# Patient Record
Sex: Female | Born: 1955 | Race: Black or African American | Hispanic: No | Marital: Single | Smoking: Former smoker
Health system: Southern US, Community
[De-identification: ages and names within clinical notes are randomized; demographics above are authoritative.]

## PROBLEM LIST (undated history)

## (undated) DIAGNOSIS — F419 Anxiety disorder, unspecified: Secondary | ICD-10-CM

## (undated) DIAGNOSIS — N183 Chronic kidney disease, stage 3 unspecified: Secondary | ICD-10-CM

## (undated) DIAGNOSIS — R001 Bradycardia, unspecified: Secondary | ICD-10-CM

## (undated) DIAGNOSIS — U071 COVID-19: Secondary | ICD-10-CM

## (undated) DIAGNOSIS — E119 Type 2 diabetes mellitus without complications: Secondary | ICD-10-CM

## (undated) DIAGNOSIS — K5900 Constipation, unspecified: Secondary | ICD-10-CM

## (undated) DIAGNOSIS — G4730 Sleep apnea, unspecified: Secondary | ICD-10-CM

## (undated) DIAGNOSIS — M545 Low back pain, unspecified: Secondary | ICD-10-CM

## (undated) DIAGNOSIS — G8929 Other chronic pain: Secondary | ICD-10-CM

## (undated) DIAGNOSIS — I2510 Atherosclerotic heart disease of native coronary artery without angina pectoris: Secondary | ICD-10-CM

## (undated) DIAGNOSIS — E785 Hyperlipidemia, unspecified: Secondary | ICD-10-CM

## (undated) DIAGNOSIS — R911 Solitary pulmonary nodule: Secondary | ICD-10-CM

## (undated) DIAGNOSIS — I4719 Other supraventricular tachycardia: Secondary | ICD-10-CM

## (undated) DIAGNOSIS — J42 Unspecified chronic bronchitis: Secondary | ICD-10-CM

## (undated) DIAGNOSIS — I471 Supraventricular tachycardia: Secondary | ICD-10-CM

## (undated) DIAGNOSIS — D649 Anemia, unspecified: Secondary | ICD-10-CM

## (undated) DIAGNOSIS — J449 Chronic obstructive pulmonary disease, unspecified: Secondary | ICD-10-CM

## (undated) DIAGNOSIS — R058 Other specified cough: Secondary | ICD-10-CM

## (undated) DIAGNOSIS — I10 Essential (primary) hypertension: Secondary | ICD-10-CM

## (undated) DIAGNOSIS — K219 Gastro-esophageal reflux disease without esophagitis: Secondary | ICD-10-CM

## (undated) DIAGNOSIS — I442 Atrioventricular block, complete: Secondary | ICD-10-CM

## (undated) DIAGNOSIS — G4733 Obstructive sleep apnea (adult) (pediatric): Secondary | ICD-10-CM

## (undated) HISTORY — DX: Chronic obstructive pulmonary disease, unspecified: J44.9

## (undated) HISTORY — DX: Anxiety disorder, unspecified: F41.9

## (undated) HISTORY — DX: Chronic kidney disease, stage 3 unspecified: N18.30

## (undated) HISTORY — DX: Anemia, unspecified: D64.9

## (undated) HISTORY — DX: Hyperlipidemia, unspecified: E78.5

## (undated) HISTORY — PX: REVISION TOTAL HIP ARTHROPLASTY: SHX766

## (undated) HISTORY — DX: Chronic kidney disease, stage 3 (moderate): N18.3

## (undated) HISTORY — PX: JOINT REPLACEMENT: SHX530

## (undated) HISTORY — DX: Bradycardia, unspecified: R00.1

## (undated) HISTORY — DX: Morbid (severe) obesity due to excess calories: E66.01

## (undated) HISTORY — DX: Sleep apnea, unspecified: G47.30

## (undated) HISTORY — DX: Other supraventricular tachycardia: I47.19

## (undated) HISTORY — DX: Supraventricular tachycardia: I47.1

## (undated) HISTORY — DX: Atrioventricular block, complete: I44.2

---

## 1998-06-30 ENCOUNTER — Encounter: Payer: Self-pay | Admitting: *Deleted

## 1998-06-30 ENCOUNTER — Emergency Department (HOSPITAL_COMMUNITY): Admission: EM | Admit: 1998-06-30 | Discharge: 1998-06-30 | Payer: Self-pay | Admitting: Emergency Medicine

## 2004-11-03 ENCOUNTER — Emergency Department (HOSPITAL_COMMUNITY): Admission: EM | Admit: 2004-11-03 | Discharge: 2004-11-03 | Payer: Self-pay | Admitting: Emergency Medicine

## 2008-02-05 ENCOUNTER — Emergency Department (HOSPITAL_COMMUNITY): Admission: EM | Admit: 2008-02-05 | Discharge: 2008-02-05 | Payer: Self-pay | Admitting: Family Medicine

## 2009-04-14 ENCOUNTER — Emergency Department (HOSPITAL_COMMUNITY): Admission: EM | Admit: 2009-04-14 | Discharge: 2009-04-14 | Payer: Self-pay | Admitting: Family Medicine

## 2010-01-03 DIAGNOSIS — F17200 Nicotine dependence, unspecified, uncomplicated: Secondary | ICD-10-CM

## 2010-01-03 DIAGNOSIS — E785 Hyperlipidemia, unspecified: Secondary | ICD-10-CM | POA: Insufficient documentation

## 2010-01-03 DIAGNOSIS — I10 Essential (primary) hypertension: Secondary | ICD-10-CM | POA: Insufficient documentation

## 2010-01-03 DIAGNOSIS — N179 Acute kidney failure, unspecified: Secondary | ICD-10-CM

## 2010-01-03 DIAGNOSIS — J449 Chronic obstructive pulmonary disease, unspecified: Secondary | ICD-10-CM

## 2010-01-04 ENCOUNTER — Ambulatory Visit: Payer: Self-pay | Admitting: Cardiovascular Disease

## 2010-01-04 ENCOUNTER — Inpatient Hospital Stay (HOSPITAL_COMMUNITY): Admission: EM | Admit: 2010-01-04 | Discharge: 2010-01-08 | Payer: Self-pay | Admitting: Emergency Medicine

## 2010-01-04 LAB — CONVERTED CEMR LAB
ALT: 14 units/L
Albumin: 3.6 g/dL
Alkaline Phosphatase: 53 units/L
Total Bilirubin: 0.3 mg/dL
Total Protein: 7.4 g/dL

## 2010-01-05 LAB — CONVERTED CEMR LAB
Total CHOL/HDL Ratio: 3.2
Triglycerides: 62 mg/dL
VLDL: 12 mg/dL

## 2010-01-07 ENCOUNTER — Encounter (INDEPENDENT_AMBULATORY_CARE_PROVIDER_SITE_OTHER): Payer: Self-pay | Admitting: Family Medicine

## 2010-01-07 LAB — CONVERTED CEMR LAB
BUN: 25 mg/dL
CO2: 24 meq/L
Calcium: 9 mg/dL
Chloride: 104 meq/L
Creatinine, Ser: 1.46 mg/dL
Glucose, Bld: 193 mg/dL
HCT: 38.8 %
Hemoglobin: 12.6 g/dL
MCHC: 32.5 g/dL
MCV: 85.1 fL
Platelets: 181 10*3/uL
Potassium: 4.5 meq/L
RBC: 4.56 M/uL
RDW: 14.4 %
Sodium: 137 meq/L
WBC: 11.8 10*3/uL

## 2010-01-28 ENCOUNTER — Ambulatory Visit: Payer: Self-pay | Admitting: Nurse Practitioner

## 2010-01-28 DIAGNOSIS — M549 Dorsalgia, unspecified: Secondary | ICD-10-CM | POA: Insufficient documentation

## 2010-01-28 DIAGNOSIS — E0590 Thyrotoxicosis, unspecified without thyrotoxic crisis or storm: Secondary | ICD-10-CM | POA: Insufficient documentation

## 2010-01-28 LAB — CONVERTED CEMR LAB
Cholesterol, target level: 200 mg/dL
HDL goal, serum: 40 mg/dL
LDL Goal: 130 mg/dL

## 2010-03-03 ENCOUNTER — Ambulatory Visit: Payer: Self-pay | Admitting: Nurse Practitioner

## 2010-03-09 ENCOUNTER — Encounter (INDEPENDENT_AMBULATORY_CARE_PROVIDER_SITE_OTHER): Payer: Self-pay | Admitting: Nurse Practitioner

## 2010-04-14 ENCOUNTER — Ambulatory Visit: Payer: Self-pay | Admitting: Nurse Practitioner

## 2010-04-30 ENCOUNTER — Telehealth (INDEPENDENT_AMBULATORY_CARE_PROVIDER_SITE_OTHER): Payer: Self-pay | Admitting: Nurse Practitioner

## 2010-05-18 ENCOUNTER — Telehealth (INDEPENDENT_AMBULATORY_CARE_PROVIDER_SITE_OTHER): Payer: Self-pay | Admitting: Nurse Practitioner

## 2010-05-18 ENCOUNTER — Ambulatory Visit: Admit: 2010-05-18 | Payer: Self-pay | Admitting: Nurse Practitioner

## 2010-06-04 NOTE — Letter (Signed)
Summary: PT INFORMATION SHEET  PT INFORMATION SHEET   Imported By: Arta Bruce 01/29/2010 14:13:49  _____________________________________________________________________  External Attachment:    Type:   Image     Comment:   External Document

## 2010-06-04 NOTE — Letter (Signed)
Summary: *HSN Results Follow up  [facility]  [address] [place], [place] [address]   Phone: [phone]  Fax: [phone]      03/09/2010   [name] [place], [place]  [address]   Dear  Ms. [name],                            ____S.Drinkard,FNP   ____D. Gore,FNP       ____B. McPherson,MD   ____V. Rankins,MD    ____E. Mulberry,MD    __X__N. Daphine Deutscher, FNP  ____D. Reche Dixon, MD    ____K. Philipp Deputy, MD    ____Other     This letter is to inform you that your recent test(s):  _______Pap Smear    ___X____Lab Test     _______X-ray    ___X____ is within acceptable limits  _______ requires a medication change  _______ requires a follow-up lab visit  _______ requires a follow-up visit with your provider   Comments: Labs done during recent office visit are normal.       _________________________________________________________ If you have any questions, please contact our office [phone].                    Sincerely,    Lehman Prom FNP [facility]

## 2010-06-04 NOTE — Assessment & Plan Note (Signed)
Summary: NEW - Hospital F/u   Vital Signs:  Patient profile:   55 year old female LMP:     01/2010 Height:      63.50 inches Weight:      217.4 pounds BMI:     38.04 O2 Sat:      97 % on Room air Temp:     97.4 degrees F oral Pulse rate:   94 / minute Pulse rhythm:   regular Resp:     24 per minute BP sitting:   110 / 66  (left arm) Cuff size:   regular  Vitals Entered By: Levon Hedger (January 28, 2010 10:53 AM)  Nutrition Counseling: Patient's BMI is greater than 25 and therefore counseled on weight management options.  O2 Flow:  Room air  Serial Vital Signs/Assessments:  Comments: 11:51 AM peak flow - 300, 150, : done by Levon Hedger By: Lehman Prom FNP   CC: Hypertension Management, Lipid Management, Back Pain Is Patient Diabetic? No Pain Assessment Patient in pain? yes     Location: back, shoulder Intensity: 10 Onset of pain  Constant  Does patient need assistance? Functional Status Self care Ambulation Normal LMP (date): 01/2010     Enter LMP: 01/2010   CC:  Hypertension Management, Lipid Management, and Back Pain.  History of Present Illness:  Pt into the office to estabilsh care. No previous PCP in over 20 years.  No PMH prior to recent to hospitalizations PSH - c sections x 3  Hospitalized from 01/03/2010 to 01/08/2010 (full hospital d/c reviewed) She presented to the ER with SOB which had been progressing over time.  1.  Hypoxic respiratory failure - pt was started on empiric therpay consisting of avelox, solumedrol and nebulizer treatments.  She was transitioned to by mouth prednisone.  Pt has d/c on doxycycline of which she has completed.    2.  Acute on chronic bronchitis/chronic obstructive pulmonary disease exacerbation  3.  Hypertension - pt started on a combination of clonidine and diltiazem  4.  Acute renal failure - no ACE or ARB due to Renal insufficiency  5.  Tobacco Abuse - pt was started on patches but she was not  able to maintain  6.  Mild dyslipidemia  7.  Low TSH, free T4 within normal limits  8.  Steroid induced hyperglycemia  Back Pain History:      The pain is located in the lower back region and does not radiate below the knees.  She states that she has had a prior history of back pain.  The patient has not had any recent physical therapy for her back pain.    Critical Exclusionary Diagnosis Criteria (CEDC) for Back Pain:      The patient gives a history of previous trauma.  She notes a prior history of spinal surgery.  There are no symptoms to suggest infection, cancer, cauda equina, or psychosocial factors for back pain.  Other positive CEDC factors include low back pain worse with activity.    Hypertension History:      She denies headache, chest pain, and palpitations.  She notes no problems with any antihypertensive medication side effects.  blood pressure is doing well.        Positive major cardiovascular risk factors include hyperlipidemia, hypertension, and current tobacco user.  Negative major cardiovascular risk factors include female age less than 2 years old and no history of diabetes.        Further assessment for target organ damage reveals  no history of ASHD, cardiac end-organ damage (CHF/LVH), stroke/TIA, peripheral vascular disease, renal insufficiency, or hypertensive retinopathy.    Lipid Management History:      Positive NCEP/ATP III risk factors include current tobacco user and hypertension.  Negative NCEP/ATP III risk factors include female age less than 71 years old, non-diabetic, no ASHD (atherosclerotic heart disease), no prior stroke/TIA, no peripheral vascular disease, and no history of aortic aneurysm.        The patient states that she knows about the "Therapeutic Lifestyle Change" diet.  The patient expresses understanding of adjunctive measures for cholesterol lowering.  She expresses no side effects from her lipid-lowering medication.  The patient denies any  symptoms to suggest myopathy or liver disease.      Habits & Providers  Alcohol-Tobacco-Diet     Alcohol drinks/day: 0     Tobacco Status: current     Tobacco Counseling: to quit use of tobacco products     Cigarette Packs/Day: 3-4 a day     Year Started: age 5  Exercise-Depression-Behavior     Drug Use: never  Allergies (verified): No Known Drug Allergies  Past History:  Past Surgical History: 3 c-section  Family History: mother - diabeter, htn father - MI and ESRD  Social History: 7 children single Tobacco - 5 cigs per day ETOH - none Drug - noneSmoking Status:  current Packs/Day:  3-4 a day Drug Use:  never  Review of Systems CV:  Denies chest pain or discomfort. Resp:  Complains of cough and shortness of breath. GI:  Denies constipation. MS:  Complains of low back pain.  Physical Exam  General:  alert.   Head:  normocephalic.   Lungs:  decreased bases Heart:  normal rate and regular rhythm.   Abdomen:  normal bowel sounds.   Neurologic:  alert & oriented X3.     Detailed Back/Spine Exam  General:    obese.    Lumbosacral Exam:  Inspection-deformity:    Normal Palpation-spinal tenderness:  Normal   Impression & Recommendations:  Problem # 1:  COPD (ICD-496) pt seems informed about the dx symbicort inhaler given to pt Her updated medication list for this problem includes:    Ventolin Hfa 108 (90 Base) Mcg/act Aers (Albuterol sulfate) .Marland Kitchen..Marland Kitchen Two puffs every 6 hours as needed for shortness of breath    Albuterol Sulfate (2.5 Mg/32ml) 0.083% Nebu (Albuterol sulfate) .Marland Kitchen... 1 nebulization inhaled four times a daily    Ventolin Hfa 108 (90 Base) Mcg/act Aers (Albuterol sulfate) .Marland Kitchen... 2 puffs inhaled four times daily as needed    Symbicort 160-4.5 Mcg/act Aero (Budesonide-formoterol fumarate) ..... One inhalation twice daily for breathing  Orders: Peak Flow Rate (94150) Pulse Oximetry (single measurment) (94760)  Problem # 2:  HYPERTHYROIDISM  (ICD-242.90) will need to recheck on next  Problem # 3:  HYPERTENSION, BENIGN ESSENTIAL (ICD-401.1) BP is stable continue current meds Her updated medication list for this problem includes:    Clonidine Hcl 0.2 Mg Tabs (Clonidine hcl) ..... One tablet by mouth three times a day for blood pressure    Diltiazem Hcl 120 Mg Tabs (Diltiazem hcl) ..... One tablet by mouth two times a day for blood pressure    Hydrochlorothiazide 25 Mg Tabs (Hydrochlorothiazide) ..... One tablet by mouth daily for blood pressure  Problem # 4:  TOBACCO ABUSE (ICD-305.1) advised cessation  Problem # 5:  OBESITY (ICD-278.00) need to increase exercise and monitor diet  Problem # 6:  BACK PAIN (ICD-724.5) may be increase due  to recent breathing problems Her updated medication list for this problem includes:    Naproxen 500 Mg Tabs (Naproxen) ..... One tablet by mouth two times a day as needed for pain  Complete Medication List: 1)  Ventolin Hfa 108 (90 Base) Mcg/act Aers (Albuterol sulfate) .... Two puffs every 6 hours as needed for shortness of breath 2)  Clonidine Hcl 0.2 Mg Tabs (Clonidine hcl) .... One tablet by mouth three times a day for blood pressure 3)  Diltiazem Hcl 120 Mg Tabs (Diltiazem hcl) .... One tablet by mouth two times a day for blood pressure 4)  Albuterol Sulfate (2.5 Mg/100ml) 0.083% Nebu (Albuterol sulfate) .Marland Kitchen.. 1 nebulization inhaled four times a daily 5)  Hydrochlorothiazide 25 Mg Tabs (Hydrochlorothiazide) .... One tablet by mouth daily for blood pressure 6)  Ventolin Hfa 108 (90 Base) Mcg/act Aers (Albuterol sulfate) .... 2 puffs inhaled four times daily as needed 7)  Symbicort 160-4.5 Mcg/act Aero (Budesonide-formoterol fumarate) .... One inhalation twice daily for breathing 8)  Naproxen 500 Mg Tabs (Naproxen) .... One tablet by mouth two times a day as needed for pain  Hypertension Assessment/Plan:      The patient's hypertensive risk group is category B: At least one risk factor  (excluding diabetes) with no target organ damage.  Her calculated 10 year risk of coronary heart disease is 6 %.  Today's blood pressure is 110/66.  Her blood pressure goal is < 140/90.  Lipid Assessment/Plan:      Based on NCEP/ATP III, the patient's risk factor category is "2 or more risk factors and a calculated 10 year CAD risk of < 20%".  The patient's lipid goals are as follows: Total cholesterol goal is 200; LDL cholesterol goal is 130; HDL cholesterol goal is 40; Triglyceride goal is 150.  Her LDL cholesterol goal has been met.     Patient Instructions: 1)  Schedule an appointment for an eligibility appointment. 2)  Use the symbicort inhaler - rinse your mouth after use. 3)  you have been given a sample of this medication so use it until it complete 4)  Schedule a follow up with n.martin,fnp for copd after your eligibilty appointment. 5)  You will need o2 sat, peak flow, flu vaccine, tsh Prescriptions: NAPROXEN 500 MG TABS (NAPROXEN) ONe tablet by mouth two times a day as needed for pain  #50 x 0   Entered and Authorized by:   Lehman Prom FNP   Signed by:   Lehman Prom FNP on 01/28/2010   Method used:   Print then Give to Patient   RxID:   [id number] SYMBICORT 160-4.5 MCG/ACT AERO (BUDESONIDE-FORMOTEROL FUMARATE) One inhalation twice daily for breathing  #1 x 0   Entered and Authorized by:   Lehman Prom FNP   Signed by:   Lehman Prom FNP on 01/28/2010   Method used:   Samples Given   RxID:   [id number] VENTOLIN HFA 108 (90 BASE) MCG/ACT AERS (ALBUTEROL SULFATE) 2 puffs inhaled four times daily as needed  #1 x 1   Entered and Authorized by:   Lehman Prom FNP   Signed by:   Lehman Prom FNP on 01/28/2010   Method used:   Print then Give to Patient   RxID:   [id number] VENTOLIN HFA 108 (90 BASE) MCG/ACT AERS (ALBUTEROL SULFATE) Two puffs every 6 hours as needed for shortness of breath  #1 x 1   Entered and Authorized by:   Lehman Prom  FNP   Signed by:  Lehman Prom FNP on 01/28/2010   Method used:   Print then Give to Patient   RxID:   [id number] HYDROCHLOROTHIAZIDE 25 MG TABS (HYDROCHLOROTHIAZIDE) One tablet by mouth daily for blood pressure  #30 x 1   Entered and Authorized by:   Lehman Prom FNP   Signed by:   Lehman Prom FNP on 01/28/2010   Method used:   Print then Give to Patient   RxID:   [id number] DILTIAZEM HCL 120 MG TABS (DILTIAZEM HCL) One tablet by mouth two times a day for blood pressure  #60 x 1   Entered and Authorized by:   Lehman Prom FNP   Signed by:   Lehman Prom FNP on 01/28/2010   Method used:   Print then Give to Patient   RxID:   [id number] CLONIDINE HCL 0.2 MG TABS (CLONIDINE HCL) One tablet by mouth three times a day for blood pressure  #90 x 1   Entered and Authorized by:   Lehman Prom FNP   Signed by:   Lehman Prom FNP on 01/28/2010   Method used:   Print then Give to Patient   RxID:   [id number]    CXR  Procedure date:  01/06/2010  Findings:      showed interval improved bibasilar ventilation.  otherwise stable chronic lung changes  Echocardiogram  Procedure date:  01/07/2010  Findings:      mild LVH with an EF at 55-60%. no regional wall motion abnormalities   CXR  Procedure date:  01/06/2010  Findings:      showed interval improved bibasilar ventilation.  otherwise stable chronic lung changes  Echocardiogram  Procedure date:  01/07/2010  Findings:      mild LVH with an EF at 55-60%. no regional wall motion abnormalities

## 2010-06-04 NOTE — Progress Notes (Signed)
Summary: Query:  Refill clonidine per protocol?  Phone Note Refill Request Call back at [phone]   Refills Requested: Medication #1:  CLONIDINE HCL 0.2 MG TABS One tablet by mouth three times a day for blood pressure [facility]   Initial call taken by: Domenic Polite,  April 30, 2010 10:32 AM  Follow-up for Phone Call        Last seen 03/2010 - Rx date 01/2010 with no refills.  Refill per protocol?  Spoke with pt. -- states she takes every day, three pills per day. Follow-up by: Dutch Quint RN,  April 30, 2010 10:47 AM  Additional Follow-up for Phone Call Additional follow up Details #1::        yes , ok to fill per protocal.  Additional Follow-up by: Lehman Prom FNP,  April 30, 2010 11:04 AM    Additional Follow-up for Phone Call Additional follow up Details #2::    Noted.  Refill completed - pt. notified. Dutch Quint RN  April 30, 2010 2:42 PM   Prescriptions: CLONIDINE HCL 0.2 MG TABS (CLONIDINE HCL) One tablet by mouth three times a day for blood pressure  #90 x 3   Entered by:   Dutch Quint RN   Authorized by:   Lehman Prom FNP   Signed by:   Dutch Quint RN on 04/30/2010   Method used:   Faxed to ...       [facility] (retail)       [address] [place], [place]  [address]       Ph: [phone]       Fax: [phone]   RxID:   [id number]

## 2010-06-04 NOTE — Assessment & Plan Note (Signed)
Summary: COPD   Vital Signs:  Patient profile:   55 year old female LMP:     01/2010 Weight:      229.8 pounds BMI:     40.21 O2 Sat:      99 % on Room air Temp:     98.2 degrees F oral Pulse rate:   96 / minute Pulse rhythm:   regular Resp:     24 per minute BP sitting:   110 / 70  (left arm) Cuff size:   regular  Vitals Entered By: Levon Hedger (March 03, 2010 9:03 AM)  Nutrition Counseling: Patient's BMI is greater than 25 and therefore counseled on weight management options.  O2 Flow:  Room air  Serial Vital Signs/Assessments:  Comments: 9:12 AM P/F  150,  150,  150 By: Levon Hedger   CC: follow-up visit breathing issue, Hypertension Management, Lipid Management Is Patient Diabetic? No Pain Assessment Patient in pain? no       Does patient need assistance? Functional Status Self care Ambulation Normal LMP (date): 01/2010     Enter LMP: 01/2010   CC:  follow-up visit breathing issue, Hypertension Management, and Lipid Management.  History of Present Illness:  Pt into the office for f/u on COPD Pt was seen s/p hospitalization on the last visit. She was given a symbicort inhaler during her last visit.  She reports that she did GREAT. She finished taking the medication about 4 days ago and noted that breathing started back being labored. When she was using the symbicort she did not use the MDI at all.  Pt did NOT bring her medications into the office.  Pt advised to bring all medications to each office visit.  Hypertension History:      She denies headache, chest pain, and palpitations.  She notes no problems with any antihypertensive medication side effects.        Positive major cardiovascular risk factors include hyperlipidemia, hypertension, and current tobacco user.  Negative major cardiovascular risk factors include female age less than 39 years old and no history of diabetes.        Further assessment for target organ damage reveals no  history of ASHD, cardiac end-organ damage (CHF/LVH), stroke/TIA, peripheral vascular disease, renal insufficiency, or hypertensive retinopathy.    Lipid Management History:      Positive NCEP/ATP III risk factors include current tobacco user and hypertension.  Negative NCEP/ATP III risk factors include female age less than 31 years old, non-diabetic, no ASHD (atherosclerotic heart disease), no prior stroke/TIA, no peripheral vascular disease, and no history of aortic aneurysm.        The patient states that she knows about the "Therapeutic Lifestyle Change" diet.      Habits & Providers  Alcohol-Tobacco-Diet     Alcohol drinks/day: 0     Tobacco Status: current     Tobacco Counseling: to quit use of tobacco products     Cigarette Packs/Day: 0.5  Exercise-Depression-Behavior     Drug Use: never  Allergies (verified): No Known Drug Allergies  Social History: Packs/Day:  0.5  Review of Systems General:  Denies fever. CV:  Denies chest pain or discomfort. Resp:  Complains of shortness of breath; restarted with the end of symbicort. GI:  Denies abdominal pain, nausea, and vomiting.  Physical Exam  General:  alert.  obese Head:  normocephalic.   Lungs:  scattered wheezes decrease air movement Heart:  normal rate and regular rhythm.   Abdomen:  normal bowel  sounds.   Msk:  up to the exam table Neurologic:  alert & oriented X3.   Skin:  color normal.   Psych:  Oriented X3.     Impression & Recommendations:  Problem # 1:  COPD (ICD-496) Will give another sample of symbicort pt to get pt assistance from the pharmacy neb given in office  Her updated medication list for this problem includes:    Ventolin Hfa 108 (90 Base) Mcg/act Aers (Albuterol sulfate) .Marland Kitchen..Marland Kitchen Two puffs every 6 hours as needed for shortness of breath    Albuterol Sulfate (2.5 Mg/80ml) 0.083% Nebu (Albuterol sulfate) .Marland Kitchen... 1 nebulization inhaled four times a daily    Ventolin Hfa 108 (90 Base) Mcg/act Aers  (Albuterol sulfate) .Marland Kitchen... 2 puffs inhaled four times daily as needed    Symbicort 160-4.5 Mcg/act Aero (Budesonide-formoterol fumarate) ..... One inhalation twice daily for breathing  Orders: Peak Flow Rate (94150) Pulse Oximetry (single measurment) (94760) Nebulizer Tx ([address]) Atrovent 1mg  (Neb) [phone]) Albuterol Sulfate Sol 1mg  unit dose (U9811)  Problem # 2:  OBESITY (ICD-278.00) Pt advised that she needs to lose weight She will try to modify her diet and increase her exercise  Problem # 3:  TOBACCO ABUSE (ICD-305.1) advised cessation as this making COPD progress  Problem # 4:  HYPERTHYROIDISM (ICD-242.90) will check labs today Orders: T-TSH ([id number])  Problem # 5:  HYPERLIPIDEMIA, MILD (ICD-272.4) no current meds  Problem # 6:  NEED PROPHYLACTIC VACCINATION&INOCULATION FLU (ICD-V04.81) given today in office  Complete Medication List: 1)  Ventolin Hfa 108 (90 Base) Mcg/act Aers (Albuterol sulfate) .... Two puffs every 6 hours as needed for shortness of breath 2)  Clonidine Hcl 0.2 Mg Tabs (Clonidine hcl) .... One tablet by mouth three times a day for blood pressure 3)  Diltiazem Hcl 120 Mg Tabs (Diltiazem hcl) .... One tablet by mouth two times a day for blood pressure 4)  Albuterol Sulfate (2.5 Mg/77ml) 0.083% Nebu (Albuterol sulfate) .Marland Kitchen.. 1 nebulization inhaled four times a daily 5)  Hydrochlorothiazide 25 Mg Tabs (Hydrochlorothiazide) .... One tablet by mouth daily for blood pressure 6)  Ventolin Hfa 108 (90 Base) Mcg/act Aers (Albuterol sulfate) .... 2 puffs inhaled four times daily as needed 7)  Symbicort 160-4.5 Mcg/act Aero (Budesonide-formoterol fumarate) .... One inhalation twice daily for breathing 8)  Naproxen 500 Mg Tabs (Naproxen) .... One tablet by mouth two times a day as needed for pain  Other Orders: Flu Vaccine 4yrs + (91478) Admin 1st Vaccine (29562)  Hypertension Assessment/Plan:      The patient's hypertensive risk group is category B: At least  one risk factor (excluding diabetes) with no target organ damage.  Her calculated 10 year risk of coronary heart disease is 6 %.  Today's blood pressure is 110/70.  Her blood pressure goal is < 140/90.  Lipid Assessment/Plan:      Based on NCEP/ATP III, the patient's risk factor category is "0-1 risk factors".  The patient's lipid goals are as follows: Total cholesterol goal is 200; LDL cholesterol goal is 130; HDL cholesterol goal is 40; Triglyceride goal is 150.  Her LDL cholesterol goal has been met.    Patient Instructions: 1)  Keep up the effort to quit smoking. 2)  Smoking makes your COPD worse. 3)  Symbicort - 1 inhalation two times a day - sample given 4)  Will [facility] a [facility] to [facility] and you will need to check there to complete forms. 5)  You thyroid labs will be checked today.  6)  Blood pressure - your blood pressure is doing great today.  Continue current medications. 7)  Weight - You need to work to decrease your weight. 8)  Start by drinking more DIET sodas and water. 9)  Try to increase your physical activity.  You can start by walking 10-15 minutes daily. 10)  Schedule an appointment for a complete physical exam. 11)  You will need PAP, mammogram, EKG, peak flow, o2 Sat. Prescriptions: SYMBICORT 160-4.5 MCG/ACT AERO (BUDESONIDE-FORMOTEROL FUMARATE) One inhalation twice daily for breathing  #1 x 5   Entered and Authorized by:   Lehman Prom FNP   Signed by:   Lehman Prom FNP on 03/03/2010   Method used:   Faxed to ...       [facility] (retail)       [address] [place], [place]  [address]       Ph: [phone]       Fax: [phone]   RxID:   [id number]    Medication Administration  Medication # 1:    Medication: Atrovent 1mg  (Neb)    Diagnosis: COPD (ICD-496)    Dose: 1 mg    Route: inhaled    Exp Date: 02/2011    Mfr: nephron    Patient tolerated medication without  complications    Given by: Gaylyn Cheers RN (March 03, 2010 10:30 AM)  Medication # 2:    Medication: Albuterol Sulfate Sol 1mg  unit dose    Diagnosis: COPD (ICD-496)    Dose: 1mg     Route: inhaled    Exp Date: 08/2010    Mfr: nephron    Patient tolerated medication without complications    Given by: Gaylyn Cheers RN (March 03, 2010 10:31 AM)  Orders Added: 1)  Est. Patient Level III [84132] 2)  Peak Flow Rate [94150] 3)  Pulse Oximetry (single measurment) [94760] 4)  T-TSH [44010-27253] 5)  Nebulizer Tx [66440] 6)  Atrovent 1mg  (Neb) [H4742] 7)  Albuterol Sulfate Sol 1mg  unit dose [J7613] 8)  Flu Vaccine 75yrs + [90658] 9)  Admin 1st Vaccine [90471]   Immunizations Administered:  Influenza Vaccine # 1:    Vaccine Type: Fluvax 3+    Site: left deltoid    Mfr: GlaxoSmithKline    Dose: 0.5 ml    Route: IM    Given by: Gaylyn Cheers RN    Exp. Date: 10/31/2010    Lot #: [id number]    VIS given: 11/25/09 version given March 03, 2010.  Flu Vaccine Consent Questions:    Do you have a history of severe allergic reactions to this vaccine? no    Any prior history of allergic reactions to egg and/or gelatin? no    Do you have a sensitivity to the preservative Thimersol? no    Do you have a past history of Guillan-Barre Syndrome? no    Do you currently have an acute febrile illness? no    Have you ever had a severe reaction to latex? no    Vaccine information given and explained to patient? yes    Are you currently pregnant? no   Immunizations Administered:  Influenza Vaccine # 1:    Vaccine Type: Fluvax 3+    Site: left deltoid    Mfr: GlaxoSmithKline    Dose: 0.5 ml    Route: IM    Given by: Gaylyn Cheers RN    Exp. Date: 10/31/2010  Lot #: [id number]    VIS given: 11/25/09 version given March 03, 2010.   Medication Administration  Medication # 1:    Medication: Atrovent 1mg  (Neb)    Diagnosis: COPD (ICD-496)    Dose: 1 mg    Route: inhaled     Exp Date: 02/2011    Mfr: nephron    Patient tolerated medication without complications    Given by: Gaylyn Cheers RN (March 03, 2010 10:30 AM)  Medication # 2:    Medication: Albuterol Sulfate Sol 1mg  unit dose    Diagnosis: COPD (ICD-496)    Dose: 1mg     Route: inhaled    Exp Date: 08/2010    Mfr: nephron    Patient tolerated medication without complications    Given by: Gaylyn Cheers RN (March 03, 2010 10:31 AM)  Orders Added: 1)  Est. Patient Level III [09811] 2)  Peak Flow Rate [94150] 3)  Pulse Oximetry (single measurment) [94760] 4)  T-TSH [91478-29562] 5)  Nebulizer Tx [13086] 6)  Atrovent 1mg  (Neb) [V7846] 7)  Albuterol Sulfate Sol 1mg  unit dose [J7613] 8)  Flu Vaccine 47yrs + [90658] 9)  Admin 1st Vaccine [96295]

## 2010-06-04 NOTE — Progress Notes (Signed)
Summary: Needs refills  Phone Note Call from Patient   Summary of Call: Mrs. Cohrs called to let us know she had an appt with [place] this morning.... pt says she needs meds and dont have enough.... pt is rescheduled for her cpp...  [facility] Initial call taken by: [place] Shannon,  May 18, 2010 2:48 PM  Follow-up for Phone Call        Pt. is scheduled for 3/15.  "[facility] customer unavailable"-unable to leave message.  Dutch Quint RN  May 19, 2010 10:58 AM  Needs diltiazem, HCTZ, symbicort and ventolin.  [place] pharmacy called - still has diltiazem refills available.  Other refills completed - pt. notified - she is to call [place] to refill diltiazem.  Dutch Quint RN  May 20, 2010 2:28 PM     Prescriptions: SYMBICORT 160-4.5 MCG/ACT AERO (BUDESONIDE-FORMOTEROL FUMARATE) One inhalation twice daily for breathing  #1 x 3   Entered by:   Dutch Quint RN   Authorized by:   Lehman Prom FNP   Signed by:   Dutch Quint RN on 05/20/2010   Method used:   Faxed to ...       [facility] (retail)       [address] [place], [place]  [address]       Ph: [phone]       Fax: [phone]   RxID:   [id number] HYDROCHLOROTHIAZIDE 25 MG TABS (HYDROCHLOROTHIAZIDE) One tablet by mouth daily for blood pressure  #30 x 3   Entered by:   Dutch Quint RN   Authorized by:   Lehman Prom FNP   Signed by:   Dutch Quint RN on 05/20/2010   Method used:   Faxed to ...       [facility] - [facility] (retail)       [address] [place], [place]  [address]       Ph: [phone]       Fax: [phone]   RxID:   [id number] VENTOLIN HFA 108 (90 BASE) MCG/ACT AERS (ALBUTEROL SULFATE) Two puffs every 6 hours as needed for shortness of breath  #1 x 1   Entered by:   Dutch Quint RN   Authorized by:   Lehman Prom FNP   Signed by:   Dutch Quint RN on 05/20/2010   Method used:   Faxed to ...  [facility] - [facility] (retail)       [address] [place], [place]  [address]       Ph: [phone]       Fax: [phone]   RxID:   [id number]

## 2010-07-16 LAB — COMPREHENSIVE METABOLIC PANEL
BUN: 14 mg/dL (ref 6–23)
CO2: 22 mEq/L (ref 19–32)
Calcium: 9.3 mg/dL (ref 8.4–10.5)
Chloride: 107 mEq/L (ref 96–112)
Creatinine, Ser: 1.09 mg/dL (ref 0.4–1.2)
GFR calc Af Amer: >60 mL/min (ref >60)
GFR calc non Af Amer: 53 mL/min — ABNORMAL LOW (ref >60)
Glucose, Bld: 162 mg/dL — ABNORMAL HIGH (ref 70–99)
Total Bilirubin: 0.3 mg/dL (ref 0.3–1.2)

## 2010-07-16 LAB — BASIC METABOLIC PANEL
BUN: 20 mg/dL (ref 6–23)
Calcium: 8.9 mg/dL (ref 8.4–10.5)
Calcium: 9 mg/dL (ref 8.4–10.5)
Chloride: 106 mEq/L (ref 96–112)
Creatinine, Ser: 1.34 mg/dL — ABNORMAL HIGH (ref 0.4–1.2)
GFR calc Af Amer: 46 mL/min — ABNORMAL LOW (ref >60)
GFR calc Af Amer: 47 mL/min — ABNORMAL LOW (ref >60)
GFR calc non Af Amer: 38 mL/min — ABNORMAL LOW (ref >60)
GFR calc non Af Amer: 39 mL/min — ABNORMAL LOW (ref >60)
Glucose, Bld: 141 mg/dL — ABNORMAL HIGH (ref 70–99)
Glucose, Bld: 154 mg/dL — ABNORMAL HIGH (ref 70–99)
Potassium: 3.7 mEq/L (ref 3.5–5.1)
Potassium: 4.2 mEq/L (ref 3.5–5.1)
Potassium: 4.5 mEq/L (ref 3.5–5.1)
Sodium: 137 mEq/L (ref 135–145)
Sodium: 140 mEq/L (ref 135–145)

## 2010-07-16 LAB — DIFFERENTIAL
Basophils Absolute: 0 10*3/uL (ref 0.0–0.1)
Basophils Relative: 1 % (ref 0–1)
Lymphocytes Relative: 25 % (ref 12–46)
Lymphocytes Relative: 9 % — ABNORMAL LOW (ref 12–46)
Lymphs Abs: 1 10*3/uL (ref 0.7–4.0)
Lymphs Abs: 1.8 10*3/uL (ref 0.7–4.0)
Monocytes Absolute: 0.7 10*3/uL (ref 0.1–1.0)
Monocytes Relative: 9 % (ref 3–12)
Neutro Abs: 4 10*3/uL (ref 1.7–7.7)
Neutrophils Relative %: 55 % (ref 43–77)
Neutrophils Relative %: 86 % — ABNORMAL HIGH (ref 43–77)

## 2010-07-16 LAB — GLUCOSE, CAPILLARY
Glucose-Capillary: 104 mg/dL — ABNORMAL HIGH (ref 70–99)
Glucose-Capillary: 107 mg/dL — ABNORMAL HIGH (ref 70–99)
Glucose-Capillary: 123 mg/dL — ABNORMAL HIGH (ref 70–99)
Glucose-Capillary: 129 mg/dL — ABNORMAL HIGH (ref 70–99)
Glucose-Capillary: 136 mg/dL — ABNORMAL HIGH (ref 70–99)
Glucose-Capillary: 151 mg/dL — ABNORMAL HIGH (ref 70–99)
Glucose-Capillary: 156 mg/dL — ABNORMAL HIGH (ref 70–99)
Glucose-Capillary: 185 mg/dL — ABNORMAL HIGH (ref 70–99)
Glucose-Capillary: 225 mg/dL — ABNORMAL HIGH (ref 70–99)

## 2010-07-16 LAB — LIPID PANEL
HDL: 57 mg/dL (ref >39)
Triglycerides: 62 mg/dL (ref <150)
VLDL: 12 mg/dL (ref 0–40)

## 2010-07-16 LAB — CBC
HCT: 37.6 % (ref 36.0–46.0)
HCT: 37.7 % (ref 36.0–46.0)
HCT: 38.8 % (ref 36.0–46.0)
HCT: 40 % (ref 36.0–46.0)
Hemoglobin: 12.2 g/dL (ref 12.0–15.0)
Hemoglobin: 12.6 g/dL (ref 12.0–15.0)
Hemoglobin: 12.8 g/dL (ref 12.0–15.0)
MCH: 27.3 pg (ref 26.0–34.0)
MCHC: 31.6 g/dL (ref 30.0–36.0)
MCHC: 32 g/dL (ref 30.0–36.0)
MCHC: 32.5 g/dL (ref 30.0–36.0)
MCV: 84.7 fL (ref 78.0–100.0)
MCV: 85.3 fL (ref 78.0–100.0)
Platelets: 201 10*3/uL (ref 150–400)
RBC: 4.45 MIL/uL (ref 3.87–5.11)
RBC: 4.56 MIL/uL (ref 3.87–5.11)
RDW: 14.6 % (ref 11.5–15.5)
WBC: 11.8 10*3/uL — ABNORMAL HIGH (ref 4.0–10.5)
WBC: 13.8 10*3/uL — ABNORMAL HIGH (ref 4.0–10.5)
WBC: 7.3 10*3/uL (ref 4.0–10.5)

## 2010-07-16 LAB — PHOSPHORUS: Phosphorus: 3.1 mg/dL (ref 2.3–4.6)

## 2010-07-16 LAB — HEMOGLOBIN A1C
Hgb A1c MFr Bld: 6 % — ABNORMAL HIGH (ref <5.7)
Mean Plasma Glucose: 126 mg/dL — ABNORMAL HIGH (ref <117)

## 2010-07-16 LAB — TSH: TSH: 0.311 u[IU]/mL — ABNORMAL LOW (ref 0.350–4.500)

## 2010-07-16 LAB — T4, FREE: Free T4: 0.93 ng/dL (ref 0.80–1.80)

## 2010-10-02 ENCOUNTER — Emergency Department (HOSPITAL_COMMUNITY): Payer: Medicaid Other

## 2010-10-02 ENCOUNTER — Inpatient Hospital Stay (HOSPITAL_COMMUNITY)
Admission: EM | Admit: 2010-10-02 | Discharge: 2010-10-05 | DRG: 192 | Disposition: A | Discharge status: Home / Self Care | Payer: Medicaid Other | Attending: Internal Medicine | Admitting: Internal Medicine

## 2010-10-02 DIAGNOSIS — J40 Bronchitis, not specified as acute or chronic: Secondary | ICD-10-CM | POA: Diagnosis present

## 2010-10-02 DIAGNOSIS — R55 Syncope and collapse: Secondary | ICD-10-CM | POA: Diagnosis present

## 2010-10-02 DIAGNOSIS — J441 Chronic obstructive pulmonary disease with (acute) exacerbation: Principal | ICD-10-CM | POA: Diagnosis present

## 2010-10-02 DIAGNOSIS — F17200 Nicotine dependence, unspecified, uncomplicated: Secondary | ICD-10-CM | POA: Diagnosis present

## 2010-10-02 DIAGNOSIS — J45901 Unspecified asthma with (acute) exacerbation: Principal | ICD-10-CM | POA: Diagnosis present

## 2010-10-02 DIAGNOSIS — I129 Hypertensive chronic kidney disease with stage 1 through stage 4 chronic kidney disease, or unspecified chronic kidney disease: Secondary | ICD-10-CM | POA: Diagnosis present

## 2010-10-02 DIAGNOSIS — E119 Type 2 diabetes mellitus without complications: Secondary | ICD-10-CM | POA: Diagnosis present

## 2010-10-02 DIAGNOSIS — N183 Chronic kidney disease, stage 3 unspecified: Secondary | ICD-10-CM | POA: Diagnosis present

## 2010-10-02 LAB — POCT I-STAT, CHEM 8
Calcium, Ion: 1.05 mmol/L — ABNORMAL LOW (ref 1.12–1.32)
Glucose, Bld: 195 mg/dL — ABNORMAL HIGH (ref 70–99)
HCT: 46 % (ref 36.0–46.0)
Hemoglobin: 15.6 g/dL — ABNORMAL HIGH (ref 12.0–15.0)
Potassium: 3.6 mEq/L (ref 3.5–5.1)

## 2010-10-02 LAB — DIFFERENTIAL
Basophils Absolute: 0 10*3/uL (ref 0.0–0.1)
Eosinophils Absolute: 0 10*3/uL (ref 0.0–0.7)
Eosinophils Relative: 0 % (ref 0–5)
Lymphocytes Relative: 19 % (ref 12–46)
Neutrophils Relative %: 79 % — ABNORMAL HIGH (ref 43–77)

## 2010-10-02 LAB — CBC
Platelets: 192 10*3/uL (ref 150–400)
RBC: 5.08 MIL/uL (ref 3.87–5.11)
RDW: 14.4 % (ref 11.5–15.5)
WBC: 8.8 10*3/uL (ref 4.0–10.5)

## 2010-10-03 LAB — HEMOGLOBIN A1C
Hgb A1c MFr Bld: 6.7 % — ABNORMAL HIGH (ref <5.7)
Mean Plasma Glucose: 146 mg/dL — ABNORMAL HIGH (ref <117)

## 2010-10-03 LAB — BASIC METABOLIC PANEL
GFR calc non Af Amer: 38 mL/min — ABNORMAL LOW (ref >60)
Potassium: 4 mEq/L (ref 3.5–5.1)
Sodium: 136 mEq/L (ref 135–145)

## 2010-10-04 LAB — GLUCOSE, CAPILLARY
Glucose-Capillary: 159 mg/dL — ABNORMAL HIGH (ref 70–99)
Glucose-Capillary: 169 mg/dL — ABNORMAL HIGH (ref 70–99)

## 2010-10-04 LAB — BASIC METABOLIC PANEL
Calcium: 9.5 mg/dL (ref 8.4–10.5)
GFR calc Af Amer: 48 mL/min — ABNORMAL LOW (ref >60)
GFR calc non Af Amer: 40 mL/min — ABNORMAL LOW (ref >60)
Sodium: 137 mEq/L (ref 135–145)

## 2010-10-05 LAB — GLUCOSE, CAPILLARY
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
## 2010-10-29 ENCOUNTER — Ambulatory Visit (HOSPITAL_COMMUNITY): Payer: Self-pay

## 2010-11-03 ENCOUNTER — Encounter (HOSPITAL_COMMUNITY): Payer: Self-pay

## 2010-11-05 ENCOUNTER — Ambulatory Visit (HOSPITAL_COMMUNITY)
Admission: RE | Admit: 2010-11-05 | Discharge: 2010-11-05 | Disposition: A | Discharge status: Home / Self Care | Payer: Medicaid Other | Source: Ambulatory Visit | Attending: Internal Medicine | Admitting: Internal Medicine

## 2010-11-05 DIAGNOSIS — R002 Palpitations: Secondary | ICD-10-CM | POA: Insufficient documentation

## 2010-11-05 DIAGNOSIS — R9431 Abnormal electrocardiogram [ECG] [EKG]: Secondary | ICD-10-CM | POA: Insufficient documentation
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
## 2010-12-14 ENCOUNTER — Other Ambulatory Visit (HOSPITAL_COMMUNITY): Payer: Self-pay | Admitting: Cardiology

## 2010-12-14 DIAGNOSIS — I493 Ventricular premature depolarization: Secondary | ICD-10-CM

## 2011-01-05 ENCOUNTER — Other Ambulatory Visit (HOSPITAL_COMMUNITY): Payer: Self-pay

## 2011-01-06 ENCOUNTER — Ambulatory Visit (HOSPITAL_COMMUNITY): Payer: Medicaid Other | Attending: Cardiology

## 2011-01-06 ENCOUNTER — Other Ambulatory Visit (HOSPITAL_COMMUNITY): Payer: Self-pay

## 2011-01-12 ENCOUNTER — Other Ambulatory Visit (HOSPITAL_COMMUNITY): Payer: Self-pay

## 2011-01-14 ENCOUNTER — Observation Stay (HOSPITAL_COMMUNITY)
Admission: AD | Admit: 2011-01-14 | Discharge: 2011-01-15 | DRG: 310 | Disposition: A | Discharge status: Home / Self Care | Payer: Medicaid Other | Source: Ambulatory Visit | Attending: Cardiology | Admitting: Cardiology

## 2011-01-14 DIAGNOSIS — N183 Chronic kidney disease, stage 3 unspecified: Secondary | ICD-10-CM | POA: Insufficient documentation

## 2011-01-14 DIAGNOSIS — E119 Type 2 diabetes mellitus without complications: Secondary | ICD-10-CM | POA: Insufficient documentation

## 2011-01-14 DIAGNOSIS — I129 Hypertensive chronic kidney disease with stage 1 through stage 4 chronic kidney disease, or unspecified chronic kidney disease: Secondary | ICD-10-CM | POA: Insufficient documentation

## 2011-01-14 DIAGNOSIS — I495 Sick sinus syndrome: Principal | ICD-10-CM | POA: Insufficient documentation

## 2011-01-14 DIAGNOSIS — E669 Obesity, unspecified: Secondary | ICD-10-CM | POA: Insufficient documentation

## 2011-01-15 DIAGNOSIS — I498 Other specified cardiac arrhythmias: Secondary | ICD-10-CM

## 2011-01-15 LAB — GLUCOSE, CAPILLARY
Glucose-Capillary: 125 mg/dL — ABNORMAL HIGH (ref 70–99)
Glucose-Capillary: 102 mg/dL — ABNORMAL HIGH (ref 70–99)

## 2011-01-15 LAB — TSH: TSH: 1.215 u[IU]/mL (ref 0.350–4.500)

## 2011-01-20 NOTE — Discharge Summary (Signed)
NAMEJALYN, Robin Clayton NO.:  [id number]  MEDICAL RECORD NO.:  [id number]  LOCATION:  3728                         FACILITY:  [facility]  PHYSICIAN:  Jake Bathe, MD      DATE OF BIRTH:  January 06, 1956  DATE OF ADMISSION:  01/14/2011 DATE OF DISCHARGE:  01/15/2011                              DISCHARGE SUMMARY   CARDIOLOGIST:  Armanda Magic, MD  Consultation by Dr. Hillis Range with Electrophysiology.  FINAL DIAGNOSES: 1. 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 2. Obesity. 3. Diabetes. 4. Hypertension.  Suggested outpatient study includes sleep study to detect any evidence of sleep apnea, which may be precipitating pauses.  BRIEF HOSPITAL COURSE:  [name] was admitted yesterday from home via EMS after event monitor demonstrated quite significant pauses up to mid 5-second range, which appear to be sinus pauses.  Her PR interval was increasing in duration.  She was asymptomatic during these episodes. She has not had any frank syncope.  She has had the sensation of palpitations, however, at times.  Her event monitor also showed few beats of nonsustained ventricular tachycardia, six beats.  Dr. Mayford Knife had tried her on carvedilol and she was on this medication during the lengthy pause.  She was also on diltiazem previously during the pauses. She has not taken the diltiazem, however, for some time.  In the hospital setting, her telemetry noted only sinus bradycardia with the longest hour-hour interval was 2.1 seconds with transient bradycardia for 2-3 beats at 6:40 a.m.  She was sleeping at that time. Asymptomatic.  Dr. Hillis Range and his team graciously saw the patient and felt as though the arrhythmias did not require pacemaker implantation and they were most likely vagally mediated.  His follow up was to obtain an outpatient sleep study given that this may be enhancing her vagal tone.  Avoid AV nodal blocking agents.  In regard  to her hypertension, her carvedilol has been stopped.  She may in fact need an additional agent and one may consider atenolol if necessary or perhaps hydralazine.  Clonidine has been given during this hospitalization and she did not have any difficulty with this or any worsening bradycardia with this at all.  Her echocardiogram has been done as an outpatient and read is currently pending per Dr. Mayford Knife.  Dr. Johney Frame and I felt given no adverse arrhythmias, currently on telemetry and asymptomatic pauses that we were comfortable, allowing her to go home.  She has close follow-up on January 25, 2011 at 10:15 a.m. with Cristopher Peru when Dr. Mayford Knife is in the office.  Once again, we will need to monitor her blood pressure, obtain a sleep study, and avoid AV nodal blocking agents.  No indication for pacemaker.  TSH was normal.  BNP showed a creatinine of 1.39, potassium of 4.4, hemoglobin A1c 6.7.  CBC; hemoglobin of 13.7.  Discharge time 35 minutes spent with the patient, med reconciliation and review of medical records.     Jake Bathe, MD     MCS/MEDQ  D:  01/15/2011  T:  01/16/2011  Job:  [id number]  cc:   Armanda Magic, M.D.  Electronically Signed  by Donato Schultz MD on 01/20/2011 06:24:48 AM

## 2011-01-25 ENCOUNTER — Other Ambulatory Visit (HOSPITAL_COMMUNITY): Payer: Medicaid Other

## 2011-01-26 ENCOUNTER — Other Ambulatory Visit (HOSPITAL_COMMUNITY): Payer: Medicaid Other

## 2011-02-04 NOTE — Consult Note (Signed)
Robin Clayton, Robin Clayton NO.:  [id number]  MEDICAL RECORD NO.:  [id number]  LOCATION:  3728                         FACILITY:  [facility]  PHYSICIAN:  Hillis Range, MD       DATE OF BIRTH:  04/04/1956  DATE OF CONSULTATION: DATE OF DISCHARGE:  01/15/2011                                CONSULTATION   REQUESTING PHYSICIAN:  Jake Bathe, MD  REASON FOR CONSULTATION:  Bradycardia.  HISTORY OF PRESENT ILLNESS:  Robin Clayton is a pleasant 55 year old female with a history of obesity, diabetes, and hypertension who was admitted after having multiple episodes of sinus bradycardia observed on her outpatient event monitor.  The patient apparently had an episode of syncope back in June 2012.  She states that at that time she was very emotionally distraught and became acutely short of breath.  She states that after breathing "very fast" for several minutes had brief syncope. She reports waking and feeling well subsequently.  She has done well since that time.  She subsequently had a Holter monitor placed which revealed nonsustained ventricular tachycardia as well as 4-second pauses.  Her diltiazem was discontinued and she was placed on low-dose Coreg.  She has done clinically very well since that time.  She denies any further episodes of dizziness, presyncope, or syncope.  She denies palpitations and apparently is quite active.  She denies chest pain, shortness of breath or other symptoms.  She does however report that she snores at night and early in the morning she states that she has headaches and does not feel well rested.  She has had another event monitor placed by Dr. Mayford Knife.  This has demonstrated predominantly sinus rhythm during the day.  However, at night, she has had multiple episodes of bradycardia as well as sinus pauses.  Her sinus pauses are preceded by prolongation of her PR interval.  The patient was sleeping during each of these episodes and denies any symptoms  with her bradycardia. She is now admitted to [facility] for further management.  Her Coreg has been held and she is presently doing quite well.  PAST MEDICAL HISTORY: 1. Hypertension. 2. Diabetes. 3. Obesity. 4. COPD. 5. Syncope in June 2012, as above. 6. Chronic renal insufficiency stage III. 7. Chronic back pain and leg pain. 8. Nonsustained ventricular tachycardia. 9. Asymptomatic bradycardia.  PAST SURGICAL HISTORY:  C-section x3.  ALLERGIES:  No known drug allergies.  MEDICATIONS:  Reviewed in the [facility].  SOCIAL HISTORY:  She denies alcohol or drug use.  She previously smoked but quit earlier this year.  FAMILY HISTORY:  Notable for coronary artery disease.  Her mother died of stomach cancer.  REVIEW OF SYSTEMS:  All systems reviewed are negative except as outlined in the HPI above.  PHYSICAL EXAMINATION:  Telemetry reveals sinus rhythm. VITAL SIGNS:  Blood pressure 134/73, heart rate 59, respirations 18, sats 99% on room air, afebrile.  On telemetry, she has only nocturnal pauses.  Her daytime heart rates are within a normal range. GENERAL:  The patient is a morbidly obese but pleasant female in no acute distress.  She is alert and oriented x3. HEENT:  Normocephalic, atraumatic.  Sclerae clear.  Conjunctivae pink. Oropharynx clear. NECK:  Supple. LUNGS:  Clear to auscultation bilaterally. HEART:  Regular rate and rhythm.  No murmurs, rubs or gallops. GI:  Soft, nontender, nondistended.  Positive bowel sounds. EXTREMITIES:  No clubbing, cyanosis or edema. SKIN:  No ecchymoses or lacerations. MUSCULOSKELETAL:  No deformity or atrophy. PSYCH:  Euthymic mood.  Full affect.  LABORATORY DATA:  Currently pending.  IMPRESSION:  Robin Clayton is a pleasant 55 year old female who is admitted for findings of nocturnal bradycardia on her recent event monitor.  She is completely asymptomatic at this time and is presently doing quite well.  I have reviewed her monitor which does  reveal nocturnal sinus bradycardia and nocturnal sinus pauses.  These are preceded by prolongation in her PR interval which are suggestive of heightened vagal tone.  I suspect that she may have sleep apnea based on her symptoms as described above and therefore this may be the underlying etiology for her bradycardic episodes.  As she has no symptoms of bradycardia during the day at this time, I would not recommend pacemaker implantation.  The patient is very clear that she wishes to avoid a pacemaker.  She did have syncope in June, however, clinically this does not sound like a bradycardic episode and likely was due to hyperventilation in the setting of an emotional episode.  RECOMMENDATIONS: 1. Obtain a thyroid profile to rule out hypothyroidism. 2. Outpatient sleep study. 3. Carvedilol is on hold.  If her rhythm remains stable overnight, I     think that she could be discharged with outpatient followup with     Dr. Mayford Knife.     Hillis Range, MD     JA/MEDQ  D:  01/15/2011  T:  01/15/2011  Job:  [id number]  cc:   Armanda Magic, M.D. Jake Bathe, MD  Electronically Signed by Hillis Range MD on 02/04/2011 08:43:46 AM

## 2011-04-07 ENCOUNTER — Other Ambulatory Visit (HOSPITAL_COMMUNITY): Payer: Self-pay | Admitting: Family Medicine

## 2011-04-07 ENCOUNTER — Ambulatory Visit (HOSPITAL_COMMUNITY)
Admission: RE | Admit: 2011-04-07 | Discharge: 2011-04-07 | Disposition: A | Discharge status: Home / Self Care | Payer: Medicaid Other | Source: Ambulatory Visit | Attending: Family Medicine | Admitting: Family Medicine

## 2011-04-07 DIAGNOSIS — R52 Pain, unspecified: Secondary | ICD-10-CM

## 2011-04-07 DIAGNOSIS — M76899 Other specified enthesopathies of unspecified lower limb, excluding foot: Secondary | ICD-10-CM | POA: Insufficient documentation

## 2011-04-07 DIAGNOSIS — M25559 Pain in unspecified hip: Secondary | ICD-10-CM | POA: Insufficient documentation

## 2011-04-09 ENCOUNTER — Emergency Department (HOSPITAL_COMMUNITY): Payer: Medicaid Other

## 2011-04-09 ENCOUNTER — Emergency Department (HOSPITAL_COMMUNITY)
Admission: EM | Admit: 2011-04-09 | Discharge: 2011-04-09 | Disposition: A | Discharge status: Home / Self Care | Payer: Medicaid Other | Attending: Emergency Medicine | Admitting: Emergency Medicine

## 2011-04-09 DIAGNOSIS — G4733 Obstructive sleep apnea (adult) (pediatric): Secondary | ICD-10-CM | POA: Insufficient documentation

## 2011-04-09 DIAGNOSIS — H5789 Other specified disorders of eye and adnexa: Secondary | ICD-10-CM | POA: Insufficient documentation

## 2011-04-09 DIAGNOSIS — E119 Type 2 diabetes mellitus without complications: Secondary | ICD-10-CM | POA: Insufficient documentation

## 2011-04-09 DIAGNOSIS — I10 Essential (primary) hypertension: Secondary | ICD-10-CM | POA: Insufficient documentation

## 2011-04-09 DIAGNOSIS — R059 Cough, unspecified: Secondary | ICD-10-CM | POA: Insufficient documentation

## 2011-04-09 DIAGNOSIS — J40 Bronchitis, not specified as acute or chronic: Secondary | ICD-10-CM

## 2011-04-09 DIAGNOSIS — R51 Headache: Secondary | ICD-10-CM | POA: Insufficient documentation

## 2011-04-09 DIAGNOSIS — R05 Cough: Secondary | ICD-10-CM | POA: Insufficient documentation

## 2011-04-09 HISTORY — DX: Obstructive sleep apnea (adult) (pediatric): G47.33

## 2011-04-09 HISTORY — DX: Essential (primary) hypertension: I10

## 2011-04-09 MED ORDER — TETRACAINE HCL 0.5 % OP SOLN
1.0000 [drp] | Freq: Once | OPHTHALMIC | Status: AC
  Administered 2011-04-09: 1 [drp] via OPHTHALMIC
  Filled 2011-04-09: qty 2

## 2011-04-09 MED ORDER — METOCLOPRAMIDE HCL 5 MG/ML IJ SOLN
10.0000 mg | Freq: Once | INTRAMUSCULAR | Status: AC
  Administered 2011-04-09: 10 mg via INTRAVENOUS
  Filled 2011-04-09: qty 2

## 2011-04-09 MED ORDER — KETOROLAC TROMETHAMINE 30 MG/ML IJ SOLN
30.0000 mg | Freq: Once | INTRAMUSCULAR | Status: AC
  Administered 2011-04-09: 30 mg via INTRAVENOUS
  Filled 2011-04-09: qty 1

## 2011-04-09 MED ORDER — DOXYCYCLINE HYCLATE 100 MG PO CAPS: 100.0000 mg | ORAL_CAPSULE | Freq: Two times a day (BID) | ORAL | Status: AC

## 2011-04-09 MED ORDER — MORPHINE SULFATE 4 MG/ML IJ SOLN
4.0000 mg | Freq: Once | INTRAMUSCULAR | Status: AC
  Administered 2011-04-09: 4 mg via INTRAVENOUS
  Filled 2011-04-09: qty 1

## 2011-04-09 MED ORDER — OXYCODONE-ACETAMINOPHEN 5-325 MG PO TABS
1.0000 | ORAL_TABLET | Freq: Once | ORAL | Status: AC
  Administered 2011-04-09: 1 via ORAL
  Filled 2011-04-09: qty 1

## 2011-04-09 MED ORDER — PREDNISONE (PAK) 10 MG PO TABS: 10.0000 mg | ORAL_TABLET | Freq: Every day | ORAL | Status: AC

## 2011-04-09 MED ORDER — IPRATROPIUM BROMIDE 0.02 % IN SOLN
0.5000 mg | Freq: Once | RESPIRATORY_TRACT | Status: AC
  Administered 2011-04-09: 0.5 mg via RESPIRATORY_TRACT
  Filled 2011-04-09: qty 2.5

## 2011-04-09 MED ORDER — ALBUTEROL SULFATE (5 MG/ML) 0.5% IN NEBU
INHALATION_SOLUTION | RESPIRATORY_TRACT | Status: AC
  Filled 2011-04-09: qty 1

## 2011-04-09 MED ORDER — ALBUTEROL SULFATE (5 MG/ML) 0.5% IN NEBU
5.0000 mg | INHALATION_SOLUTION | Freq: Once | RESPIRATORY_TRACT | Status: AC
  Administered 2011-04-09: 5 mg via RESPIRATORY_TRACT
  Filled 2011-04-09: qty 1

## 2011-04-09 MED ORDER — ALBUTEROL SULFATE (5 MG/ML) 0.5% IN NEBU: 5.0000 mg | INHALATION_SOLUTION | Freq: Once | RESPIRATORY_TRACT | Status: DC

## 2011-04-09 MED ORDER — DIPHENHYDRAMINE HCL 50 MG/ML IJ SOLN
25.0000 mg | Freq: Once | INTRAMUSCULAR | Status: AC
  Administered 2011-04-09: 16:00:00 via INTRAVENOUS
  Filled 2011-04-09: qty 1

## 2011-04-09 MED ORDER — IPRATROPIUM BROMIDE 0.02 % IN SOLN: 0.5000 mg | Freq: Once | RESPIRATORY_TRACT | Status: DC

## 2011-04-09 NOTE — ED Notes (Signed)
Pt presents with 2 day h/o bilateral temporal headache and shortness of breath.  Pt reports blood-shot eyes, (is tearing during triage).  Pt reports productive cough with yellow phlegm and nasal congestion.  Pt reports she is taking vicodin given to her x 2 days ago for R leg pain (seen here for that).

## 2011-04-09 NOTE — ED Provider Notes (Signed)
History     CSN: [id number] Arrival date & time: 04/09/2011 11:09 AM   First MD Initiated Contact with Patient 04/09/11 1114      Chief Complaint  Patient presents with  . Headache    (Consider location/radiation/quality/duration/timing/severity/associated sxs/prior treatment) HPI Comments: Patient presents with a lateral temporal headache for 2 days. Pain is described as dull, constant, not worsened by position, bright lights, or loud sounds. Patient does not have a history of migraines or other types of headaches. It has not been associated with vomiting. Patient states she started having a cough that began after the headache that worsens the pain. She also describes bilateral redness of her eyes. Her vision is unchanged. Patient has history of bronchitis and uses an albuterol nebulizer for this.  Patient is a 55 y.o. female presenting with headaches. The history is provided by the patient.  Headache  This is a new problem. The current episode started more than 2 days ago. The problem occurs constantly. The problem has not changed since onset.The headache is associated with coughing. The pain is located in the left unilateral and right unilateral region. The quality of the pain is described as dull. The pain is at a severity of 6/10. Pertinent negatives include no fever, no shortness of breath, no nausea and no vomiting.    Past Medical History  Diagnosis Date  . Diabetes mellitus   . Hypertension   . Bronchitis   . Obstructive sleep apnea     History reviewed. No pertinent past surgical history.  No family history on file.  History  Substance Use Topics  . Smoking status: Current Everyday Smoker -- 0.5 packs/day  . Smokeless tobacco: Not on file  . Alcohol Use: No    OB History    Grav Para Term Preterm Abortions TAB SAB Ect Mult Living                  Review of Systems  Constitutional: Negative for fever and chills.  HENT: Positive for congestion, rhinorrhea and  sinus pressure. Negative for sore throat.   Eyes: Negative for discharge.  Respiratory: Negative for shortness of breath.   Cardiovascular: Negative for chest pain.  Gastrointestinal: Negative for nausea, vomiting, abdominal pain, diarrhea and constipation.  Genitourinary: Negative for dysuria and hematuria.  Musculoskeletal: Negative for myalgias.  Skin: Negative for rash.  Neurological: Positive for headaches.  Psychiatric/Behavioral: Negative for confusion.    Allergies  Review of patient's allergies indicates no known allergies.  Home Medications   Current Outpatient Rx  Name Route Sig Dispense Refill  . ALBUTEROL SULFATE HFA 108 (90 BASE) MCG/ACT IN AERS Inhalation Inhale 2 puffs into the lungs every 6 (six) hours as needed. For shortness of breath     . ALBUTEROL SULFATE (2.5 MG/3ML) 0.083% IN NEBU Nebulization Take 2.5 mg by nebulization every 6 (six) hours as needed. For shortness of breath    . AMLODIPINE BESYLATE 5 MG PO TABS Oral Take 5 mg by mouth daily.     . BUDESONIDE-FORMOTEROL FUMARATE 160-4.5 MCG/ACT IN AERO Inhalation Inhale 2 puffs into the lungs 2 (two) times daily.      Marland Kitchen CLONIDINE HCL 0.2 MG PO TABS Oral Take 0.2 mg by mouth 3 (three) times daily.     Marland Kitchen GLIPIZIDE 5 MG PO TABS Oral Take 2.5 mg by mouth 2 (two) times daily before a meal.     . COUGH SYRUP PO Oral Take 15 mLs by mouth 2 (two) times daily as  needed. For cough     . HYDROCODONE-ACETAMINOPHEN 5-500 MG PO TABS Oral Take 1 tablet by mouth every 6 (six) hours as needed. For pain      BP 142/58  Pulse 93  Temp(Src) 99.7 F (37.6 C) (Oral)  Resp 22  Ht 5\' 4"  (1.626 m)  Wt 236 lb (107.049 kg)  BMI 40.51 kg/m2  SpO2 100%  LMP 03/18/2011  Physical Exam  Nursing note and vitals reviewed. Constitutional: She is oriented to person, place, and time. She appears well-developed and well-nourished.  HENT:  Head: Normocephalic and atraumatic.  Eyes: Right eye exhibits no discharge. Left eye exhibits no  discharge. Right conjunctiva is injected. Right conjunctiva has no hemorrhage. Left conjunctiva is injected. Left conjunctiva has no hemorrhage. Right pupil is reactive. Left pupil is reactive.  Neck: Normal range of motion. Neck supple.  Cardiovascular: Normal rate and regular rhythm.  Exam reveals no gallop and no friction rub.   No murmur heard. Pulmonary/Chest: Effort normal. No respiratory distress. She has wheezes.       Slight diffuse expiratory wheeze.  Abdominal: Soft. There is no tenderness. There is no rebound and no guarding.  Musculoskeletal: Normal range of motion.  Neurological: She is alert and oriented to person, place, and time.  Skin: Skin is warm and dry. No rash noted.  Psychiatric: She has a normal mood and affect.    ED Course  Procedures (including critical care time)  Labs Reviewed - No data to display Dg Chest 2 View  04/09/2011  *RADIOLOGY REPORT*  Clinical Data: Shortness of breath.  Productive cough.  High blood pressure.  Smoker.  CHEST - 2 VIEW  Comparison: 10/02/2010, 01/04/2010 and 02/05/2008.  Findings: Pulmonary vascular congestion most notable centrally. Hilar prominence is similar to prior exams.  Peribronchial thickening consistent with chronic bronchitis type changes.  Increased markings lung bases noted dating back to 2011 which may represent chronic changes limiting detection of subtle infiltrate. No segmental consolidation noted.  No pneumothorax.  Heart size top normal to slightly enlarged.  IMPRESSION: Chronic increased lung markings with pulmonary vascular congestion without segmental consolidation as detailed above.  Original Report Authenticated By: Fuller Canada, M.D.     1. Bronchitis   2. Headache    4:21 PM patient was previously seen and examined. Chest x-ray reviewed by myself. Patient has had slight relief with Percocet. Tonometry of the eyes performed and is normal bilaterally. Left eye pressure was 18 and right eye pressure was 20.  Will give headache cocktail and reassess.  Handoff to Chad PA-C who will reevaluate. Plan to discharge to home with prescription for doxycycline and five-day burst of steroids for bronchitis. Urged PCP followup in the next week.   MDM  Do not suspect intracranial hemorrhage due to gradual onset of headache. Do not suspect meningitis or infectious process. Patient likely has upper respiratory tract infection as well as bronchitis with cough exacerbating headache. Patient appears well and is neurologically intact. She has primary care doctor followup.        Eustace Moore [place], [place] 04/09/11 [phone]

## 2011-04-09 NOTE — ED Provider Notes (Signed)
5:37 PM Assumed care of patient at change of shift, pending pain control for headache and d/c home.  Patient with dx bronchitis and headache.  Patient states her headache has improved from a 9/10 to a 6/10 but is not yet tolerable.  States she is hungry and thirsty and would like something to eat and drink.  Requests more medication for headache.  Will treat pain, feed patient and recheck.    6:44 PM Patient reports pain is now 3/10, feeling much better.  Plan is for d/c home.   Dillard Cannon [place], [place] 04/09/11 [phone]

## 2011-04-09 NOTE — ED Provider Notes (Signed)
  Physical Exam  BP 122/52  Pulse 88  Temp(Src) 99.7 F (37.6 C) (Oral)  Resp 24  Ht 5\' 4"  (1.626 m)  Wt 236 lb (107.049 kg)  BMI 40.51 kg/m2  SpO2 98%  Physical Exam Diffuse wheezes and prolonged expirations. Blood shot eyes.  ED Course  Procedures  MDM Patient's had a headache for 2 days. She's been coughing with some production. She states her head feels bad. She's been on Vicodin for her leg pain she got from her primary care Dr. Low-grade temperature here. Mild relief with her inhalers.      Juliet Rude. Rubin Payor, MD 04/09/11 1125

## 2011-04-09 NOTE — ED Notes (Signed)
Pt had one episode of clear emesis just following administration of meds. Comfort measures and will continue to assess

## 2011-04-11 NOTE — ED Provider Notes (Signed)
History/physical exam/procedure(s) were performed by non-physician practitioner and as supervising physician I was immediately available for consultation/collaboration. I have reviewed all notes and am in agreement with care and plan.   Tyarra Nolton S Jaianna Nicoll, MD 04/11/11 1058 

## 2011-04-11 NOTE — ED Provider Notes (Signed)
History/physical exam/procedure(s) were performed by non-physician practitioner and as supervising physician I was immediately available for consultation/collaboration. I have reviewed all notes and am in agreement with care and plan.   Hilario Quarry, MD 04/11/11 1058

## 2011-05-13 ENCOUNTER — Other Ambulatory Visit: Payer: Self-pay | Admitting: Family Medicine

## 2011-05-13 ENCOUNTER — Other Ambulatory Visit (HOSPITAL_COMMUNITY)
Admission: RE | Admit: 2011-05-13 | Discharge: 2011-05-13 | Disposition: A | Discharge status: Home / Self Care | Payer: Medicaid Other | Source: Ambulatory Visit | Attending: Family Medicine | Admitting: Family Medicine

## 2011-05-13 DIAGNOSIS — Z01419 Encounter for gynecological examination (general) (routine) without abnormal findings: Secondary | ICD-10-CM | POA: Insufficient documentation

## 2011-05-17 ENCOUNTER — Other Ambulatory Visit (HOSPITAL_COMMUNITY): Payer: Self-pay | Admitting: Family Medicine

## 2011-05-17 DIAGNOSIS — Z1231 Encounter for screening mammogram for malignant neoplasm of breast: Secondary | ICD-10-CM

## 2011-05-20 ENCOUNTER — Encounter: Payer: Self-pay | Admitting: Gastroenterology

## 2011-05-27 ENCOUNTER — Ambulatory Visit (AMBULATORY_SURGERY_CENTER): Payer: Medicaid Other | Admitting: *Deleted

## 2011-05-27 VITALS — Ht 63.0 in | Wt 231.5 lb

## 2011-05-27 DIAGNOSIS — Z1211 Encounter for screening for malignant neoplasm of colon: Secondary | ICD-10-CM

## 2011-05-27 MED ORDER — PEG-KCL-NACL-NASULF-NA ASC-C 100 G PO SOLR: ORAL | Status: DC

## 2011-06-07 ENCOUNTER — Ambulatory Visit (AMBULATORY_SURGERY_CENTER): Payer: Medicaid Other | Admitting: Gastroenterology

## 2011-06-07 ENCOUNTER — Encounter: Payer: Self-pay | Admitting: Gastroenterology

## 2011-06-07 VITALS — BP 125/70 | HR 76 | Temp 96.0°F | Resp 16 | Ht 63.0 in | Wt 231.0 lb

## 2011-06-07 DIAGNOSIS — Z1211 Encounter for screening for malignant neoplasm of colon: Secondary | ICD-10-CM

## 2011-06-07 MED ORDER — SODIUM CHLORIDE 0.9 % IV SOLN: 500.0000 mL | INTRAVENOUS | Status: DC

## 2011-06-07 NOTE — Op Note (Signed)
[facility] [address] [place], [place]  [address]  COLONOSCOPY PROCEDURE REPORT  PATIENT:  Robin Clayton, Robin Clayton  MR#:  [id number] BIRTHDATE:  01/30/1956, 55 yrs. old  GENDER:  female ENDOSCOPIST:  Barbette Hair. Arlyce Dice, MD REF. BY:  Yisroel Ramming, M.D. PROCEDURE DATE:  06/07/2011 PROCEDURE:  Diagnostic Colonoscopy ASA CLASS:  Class II INDICATIONS:  Routine Risk Screening MEDICATIONS:   MAC sedation, administered by CRNA propofol 300mg IV  DESCRIPTION OF PROCEDURE:   After the risks benefits and alternatives of the procedure were thoroughly explained, informed consent was obtained.  Digital rectal exam was performed and revealed no abnormalities.   The LB [id number]-[id number] endoscope was introduced through the anus and advanced to the cecum, which was identified by both the appendix and ileocecal valve, without limitations.  The quality of the prep was good, using MoviPrep. The instrument was then slowly withdrawn as the colon was fully examined. <<PROCEDUREIMAGES>>  FINDINGS:  A normal appearing cecum, ileocecal valve, and appendiceal orifice were identified. The ascending, hepatic flexure, transverse, splenic flexure, descending, sigmoid colon, and rectum appeared unremarkable (see image1, image2, and image4). Retroflexed views in the rectum revealed no abnormalities.    The time to cecum =  1) 3.50  minutes. The scope was then withdrawn in 1) 9.0  minutes from the cecum and the procedure completed. COMPLICATIONS:  None ENDOSCOPIC IMPRESSION: 1) Normal colon RECOMMENDATIONS: 1) Continue current colorectal screening recommendations for "routine risk" patients with a repeat colonoscopy in 10 years. REPEAT EXAM:  In 10 year(s) for Colonoscopy.  ______________________________ Barbette Hair. Arlyce Dice, MD  CC:  n. eSIGNED:   Barbette Hair. Kaplan at 06/07/2011 03:18 PM  Janee Morn, [phone]

## 2011-06-07 NOTE — Patient Instructions (Signed)
FOLLOW DISCHARGE INSTRUCTIONS (BLUE AND GREEN SHEETS).. 

## 2011-06-07 NOTE — Progress Notes (Signed)
1524 pt. Used one puff of symbicort inhaler. States she has chronic bronchitis.  Marrion Coy, CRNA advised by Weston Brass that Pt. Used inhaler.Patient did not experience any of the following events: a burn prior to discharge; a fall within the facility; wrong site/side/patient/procedure/implant event; or a hospital transfer or hospital admission upon discharge from the facility. [phone]) Patient did not have preoperative order for IV antibiotic SSI prophylaxis. [phone])

## 2011-06-08 ENCOUNTER — Telehealth: Payer: Self-pay

## 2011-06-08 NOTE — Telephone Encounter (Signed)
No answer I left a message on the phone for the pt to call if any question or concern. Maw

## 2011-06-12 ENCOUNTER — Encounter: Payer: Self-pay | Admitting: Internal Medicine

## 2011-06-29 ENCOUNTER — Ambulatory Visit (HOSPITAL_COMMUNITY): Payer: Medicaid Other

## 2011-06-29 ENCOUNTER — Ambulatory Visit (HOSPITAL_COMMUNITY)
Admission: RE | Admit: 2011-06-29 | Discharge: 2011-06-29 | Disposition: A | Discharge status: Home / Self Care | Payer: Medicaid Other | Source: Ambulatory Visit | Attending: Family Medicine | Admitting: Family Medicine

## 2011-06-29 DIAGNOSIS — Z1231 Encounter for screening mammogram for malignant neoplasm of breast: Secondary | ICD-10-CM | POA: Insufficient documentation

## 2011-07-02 ENCOUNTER — Ambulatory Visit (INDEPENDENT_AMBULATORY_CARE_PROVIDER_SITE_OTHER): Payer: Medicaid Other | Admitting: Internal Medicine

## 2011-07-02 ENCOUNTER — Other Ambulatory Visit: Payer: Self-pay | Admitting: Family Medicine

## 2011-07-02 ENCOUNTER — Encounter: Payer: Self-pay | Admitting: Internal Medicine

## 2011-07-02 VITALS — BP 128/83 | HR 86 | Resp 20 | Ht 63.0 in | Wt 243.0 lb

## 2011-07-02 DIAGNOSIS — E669 Obesity, unspecified: Secondary | ICD-10-CM

## 2011-07-02 DIAGNOSIS — I498 Other specified cardiac arrhythmias: Secondary | ICD-10-CM

## 2011-07-02 DIAGNOSIS — R928 Other abnormal and inconclusive findings on diagnostic imaging of breast: Secondary | ICD-10-CM

## 2011-07-02 DIAGNOSIS — I10 Essential (primary) hypertension: Secondary | ICD-10-CM

## 2011-07-02 DIAGNOSIS — R001 Bradycardia, unspecified: Secondary | ICD-10-CM | POA: Insufficient documentation

## 2011-07-02 NOTE — Assessment & Plan Note (Signed)
The patient has had nocturnal bradycardia again demonstrated.  I have reviewed strips which reveal PP interval prolongation at the same time as AV block.  This is clearly due to increased vagal tone with sleep and likely due to sleep apnea.  She has no daytime symptoms of bradycardia and no daytime pauses have been identified.  She does not have an indication for pacemaker implantation at this time.  She is very clear that she wishes to avoid PPM if at all possible. I have recommended that she continue to wear her CPAP and follow-up with Dr Mayford Knife for CPAP titration as necessary in the future. I also believe that weight reduction would significant improve her quality of life and reduce episodes of nocturnal bradycardia.  No further EP evaluation is planned. She will contact me if symptoms of bradycardia or syncope occur.  She will continue to follow closely with Dr Mayford Knife in the interim.

## 2011-07-02 NOTE — Progress Notes (Signed)
PCP:  Eustace Moore, MD, MD Primary Cardiologist:  Dr Mayford Knife  The patient presents today for routine electrophysiology followup.  She was previously seen by me during her hospitalization 01/16/11.  She has done quite well recently. She is now compliant with CPAP and reports that her energy is improved.  She symptoms of palpitations, chest pain, shortness of breath, orthopnea, PND, or lower extremity edema.  She has chronic bronchitis with cough.  She denies symptoms of bradycardia including dizziness, presyncope, syncope, or neurologic sequela.  The patient feels that she is tolerating medications without difficulties and is otherwise without complaint today.   Past Medical History  Diagnosis Date  . Diabetes mellitus   . Hypertension   . Bronchitis   . Obstructive sleep apnea     compliant with cpap  . DDD (degenerative disc disease)   . Chronic renal insufficiency    Past Surgical History  Procedure Date  . Cesarean section 1970    Current Outpatient Prescriptions  Medication Sig Dispense Refill  . albuterol (PROVENTIL HFA;VENTOLIN HFA) 108 (90 BASE) MCG/ACT inhaler Inhale 2 puffs into the lungs every 6 (six) hours as needed. For shortness of breath       . albuterol (PROVENTIL) (2.5 MG/3ML) 0.083% nebulizer solution Take 2.5 mg by nebulization every 6 (six) hours as needed. For shortness of breath      . amLODipine (NORVASC) 5 MG tablet Take 5 mg by mouth daily.       . budesonide-formoterol (SYMBICORT) 160-4.5 MCG/ACT inhaler Inhale 2 puffs into the lungs 2 (two) times daily.        . cloNIDine (CATAPRES) 0.2 MG tablet Take 0.2 mg by mouth 3 (three) times daily.       Marland Kitchen glipiZIDE (GLUCOTROL) 5 MG tablet Take 2.5 mg by mouth 2 (two) times daily before a meal.       . HYDROcodone-acetaminophen (VICODIN) 5-500 MG per tablet Take 1 tablet by mouth every 6 (six) hours as needed. For pain      . GuaiFENesin (COUGH SYRUP PO) Take 15 mLs by mouth 2 (two) times daily as needed. For cough          No Known Allergies  History   Social History  . Marital Status: Single    Spouse Name: N/A    Number of Children: N/A  . Years of Education: N/A   Occupational History  . Not on file.   Social History Main Topics  . Smoking status: Current Everyday Smoker -- 0.5 packs/day    Types: Cigarettes  . Smokeless tobacco: Never Used  . Alcohol Use: No  . Drug Use: No  . Sexually Active: Not on file   Other Topics Concern  . Not on file   Social History Narrative  . No narrative on file    Family History  Problem Relation Age of Onset  . Stomach cancer Mother 57  . Colon cancer Neg Hx    Physical Exam: Filed Vitals:   07/02/11 1531  BP: 128/83  Pulse: 86  Resp: 20  Height: 5\' 3"  (1.6 m)  Weight: 243 lb (110.224 kg)    GEN- The patient is overweight appearing, alert and oriented x 3 today.   Head- normocephalic, atraumatic Eyes-  Sclera clear, conjunctiva pink Ears- hearing intact Oropharynx- clear Neck- supple, no JVP Lymph- no cervical lymphadenopathy Lungs- Coarse BS which clear with cough Heart- Regular rate and rhythm, no murmurs, rubs or gallops, PMI not laterally displaced GI- soft, NT, ND, +  BS Extremities- no clubbing, cyanosis, or edema  ekg today reveals sinus rhythm 86 bpm, PR 148, QRS 72, Qtc 461, poor R wave progression Event monitor 2/13 reviewed- this reveals predominantly sinus rhythm,  She did have nocturnal pauses (06/04/11 at 4:25am, 4:42 am and 4:36 am) these are each AV block preceeded/ accompanied by PP interval prolongation with subsequent P waves not conducted  Assessment and Plan:

## 2011-07-02 NOTE — Patient Instructions (Signed)
Follow up with Dr. Johney Frame as needed.  You have been referred to Dietary and Nutritional Services.

## 2011-07-05 ENCOUNTER — Institutional Professional Consult (permissible substitution): Payer: Medicaid Other | Admitting: Internal Medicine

## 2011-07-05 ENCOUNTER — Other Ambulatory Visit: Payer: Self-pay | Admitting: Internal Medicine

## 2011-07-05 DIAGNOSIS — R928 Other abnormal and inconclusive findings on diagnostic imaging of breast: Secondary | ICD-10-CM

## 2011-07-07 ENCOUNTER — Telehealth: Payer: Self-pay | Admitting: Internal Medicine

## 2011-07-07 NOTE — Telephone Encounter (Signed)
Please return call to patient at hm# [phone]  Patient would like to f/u on nutrition referral, as she has not heard anything.

## 2011-07-08 ENCOUNTER — Other Ambulatory Visit: Payer: Self-pay | Admitting: *Deleted

## 2011-07-08 NOTE — Telephone Encounter (Signed)
Robin Clayton will call to schedule pt [phone])

## 2011-07-08 NOTE — Progress Notes (Signed)
Addended by: Dennis Bast F on: 07/08/2011 01:10 PM   Modules accepted: Orders

## 2011-07-09 ENCOUNTER — Encounter: Payer: Self-pay | Admitting: Internal Medicine

## 2011-07-12 ENCOUNTER — Ambulatory Visit
Admission: RE | Admit: 2011-07-12 | Discharge: 2011-07-12 | Disposition: A | Discharge status: Home / Self Care | Payer: Medicaid Other | Source: Ambulatory Visit | Attending: Family Medicine | Admitting: Family Medicine

## 2011-07-12 DIAGNOSIS — R928 Other abnormal and inconclusive findings on diagnostic imaging of breast: Secondary | ICD-10-CM

## 2011-08-11 ENCOUNTER — Emergency Department (HOSPITAL_COMMUNITY)
Admission: EM | Admit: 2011-08-11 | Discharge: 2011-08-11 | Disposition: A | Discharge status: Home / Self Care | Payer: Medicaid Other | Attending: Emergency Medicine | Admitting: Emergency Medicine

## 2011-08-11 ENCOUNTER — Encounter (HOSPITAL_COMMUNITY): Payer: Self-pay | Admitting: *Deleted

## 2011-08-11 DIAGNOSIS — F17200 Nicotine dependence, unspecified, uncomplicated: Secondary | ICD-10-CM | POA: Insufficient documentation

## 2011-08-11 DIAGNOSIS — I129 Hypertensive chronic kidney disease with stage 1 through stage 4 chronic kidney disease, or unspecified chronic kidney disease: Secondary | ICD-10-CM | POA: Insufficient documentation

## 2011-08-11 DIAGNOSIS — G4733 Obstructive sleep apnea (adult) (pediatric): Secondary | ICD-10-CM | POA: Insufficient documentation

## 2011-08-11 DIAGNOSIS — R600 Localized edema: Secondary | ICD-10-CM

## 2011-08-11 DIAGNOSIS — N189 Chronic kidney disease, unspecified: Secondary | ICD-10-CM | POA: Insufficient documentation

## 2011-08-11 DIAGNOSIS — M7989 Other specified soft tissue disorders: Secondary | ICD-10-CM | POA: Insufficient documentation

## 2011-08-11 DIAGNOSIS — IMO0002 Reserved for concepts with insufficient information to code with codable children: Secondary | ICD-10-CM | POA: Insufficient documentation

## 2011-08-11 DIAGNOSIS — E119 Type 2 diabetes mellitus without complications: Secondary | ICD-10-CM | POA: Insufficient documentation

## 2011-08-11 LAB — BASIC METABOLIC PANEL
BUN: 16 mg/dL (ref 6–23)
Chloride: 102 mEq/L (ref 96–112)
Creatinine, Ser: 1.12 mg/dL — ABNORMAL HIGH (ref 0.50–1.10)
GFR calc Af Amer: 63 mL/min — ABNORMAL LOW (ref >90)

## 2011-08-11 MED ORDER — POTASSIUM CHLORIDE CRYS ER 20 MEQ PO TBCR: 20.0000 meq | EXTENDED_RELEASE_TABLET | Freq: Every day | ORAL | Status: DC

## 2011-08-11 MED ORDER — FUROSEMIDE 20 MG PO TABS: 20.0000 mg | ORAL_TABLET | Freq: Two times a day (BID) | ORAL | Status: DC

## 2011-08-11 NOTE — ED Notes (Signed)
Pt reports bilateral swelling to ankles x 3 days. Pain to left ankle, no known injury. Pulses present.

## 2011-08-11 NOTE — ED Provider Notes (Signed)
History     CSN: [id number]  Arrival date & time 08/11/11  1622   First MD Initiated Contact with Patient 08/11/11 1744      Chief Complaint  Patient presents with  . Foot Swelling    (Consider location/radiation/quality/duration/timing/severity/associated sxs/prior treatment) The history is provided by the patient.   Patient here complaining of bilateral lower extremity swelling x3 days. Symptoms were intermittent initially and now been persistent and have not been associated with shortness of breath or cough. History of renal insufficiency and notes normal urination currently. No prior history of this. No fever. Symptoms worse with standing and better with rest Past Medical History  Diagnosis Date  . Diabetes mellitus   . Hypertension   . Bronchitis   . Obstructive sleep apnea     compliant with cpap  . DDD (degenerative disc disease)   . Chronic renal insufficiency     Past Surgical History  Procedure Date  . Cesarean section 1970    Family History  Problem Relation Age of Onset  . Stomach cancer Mother 38  . Colon cancer Neg Hx     History  Substance Use Topics  . Smoking status: Current Everyday Smoker -- 0.5 packs/day    Types: Cigarettes  . Smokeless tobacco: Never Used  . Alcohol Use: No    OB History    Grav Para Term Preterm Abortions TAB SAB Ect Mult Living                  Review of Systems  All other systems reviewed and are negative.    Allergies  Review of patient's allergies indicates no known allergies.  Home Medications   Current Outpatient Rx  Name Route Sig Dispense Refill  . ALBUTEROL SULFATE HFA 108 (90 BASE) MCG/ACT IN AERS Inhalation Inhale 2 puffs into the lungs every 6 (six) hours as needed. For shortness of breath     . ALBUTEROL SULFATE (2.5 MG/3ML) 0.083% IN NEBU Nebulization Take 2.5 mg by nebulization every 6 (six) hours as needed. For shortness of breath    . AMLODIPINE BESYLATE 5 MG PO TABS Oral Take 5 mg by mouth  daily.     . BUDESONIDE-FORMOTEROL FUMARATE 160-4.5 MCG/ACT IN AERO Inhalation Inhale 2 puffs into the lungs 2 (two) times daily.      Marland Kitchen CLONIDINE HCL 0.2 MG PO TABS Oral Take 0.2 mg by mouth 3 (three) times daily.     Marland Kitchen DOXYCYCLINE HYCLATE 100 MG PO CAPS Oral Take 100 mg by mouth daily.    Marland Kitchen GLIPIZIDE 5 MG PO TABS Oral Take 2.5 mg by mouth 2 (two) times daily before a meal.     . HYDROCODONE-ACETAMINOPHEN 5-500 MG PO TABS Oral Take 1 tablet by mouth every 6 (six) hours as needed. For pain      BP 113/75  Pulse 77  Temp 98.5 F (36.9 C)  Resp 18  SpO2 96%  Physical Exam  Nursing note and vitals reviewed. Constitutional: She is oriented to person, place, and time. Vital signs are normal. She appears well-developed and well-nourished.  Non-toxic appearance. No distress.  HENT:  Head: Normocephalic and atraumatic.  Eyes: Conjunctivae, EOM and lids are normal. Pupils are equal, round, and reactive to light.  Neck: Normal range of motion. Neck supple. No tracheal deviation present. No mass present.  Cardiovascular: Normal rate, regular rhythm and normal heart sounds.  Exam reveals no gallop.   No murmur heard. Pulmonary/Chest: Effort normal and breath sounds normal. No  stridor. No respiratory distress. She has no decreased breath sounds. She has no wheezes. She has no rhonchi. She has no rales.  Abdominal: Soft. Normal appearance and bowel sounds are normal. She exhibits no distension. There is no tenderness. There is no rebound and no CVA tenderness.  Musculoskeletal: Normal range of motion. She exhibits no edema and no tenderness.       Bilateral 1+ pitting edema. Neurovascular status stable both lower extremities  Neurological: She is alert and oriented to person, place, and time. She has normal strength. No cranial nerve deficit or sensory deficit. GCS eye subscore is 4. GCS verbal subscore is 5. GCS motor subscore is 6.  Skin: Skin is warm and dry. No abrasion and no rash noted.    Psychiatric: She has a normal mood and affect. Her speech is normal and behavior is normal.    ED Course  Procedures (including critical care time)  Labs Reviewed - No data to display No results found.   No diagnosis found.    MDM  Patient's renal function is stable here. Will place on low-dose diuretics and she will followup with her Dr.        Toy Baker, MD 08/11/11 [phone]

## 2011-08-11 NOTE — Discharge Instructions (Signed)
Peripheral Edema You have swelling in your legs (peripheral edema). This swelling is due to excess accumulation of salt and water in your body. Edema may be a sign of heart, kidney or liver disease, or a side effect of a medication. It may also be due to problems in the leg veins. Elevating your legs and using special support stockings may be very helpful, if the cause of the swelling is due to poor venous circulation. Avoid long periods of standing, whatever the cause. Treatment of edema depends on identifying the cause. Chips, pretzels, pickles and other salty foods should be avoided. Restricting salt in your diet is almost always needed. Water pills (diuretics) are often used to remove the excess salt and water from your body via urine. These medicines prevent the kidney from reabsorbing sodium. This increases urine flow. Diuretic treatment may also result in lowering of potassium levels in your body. Potassium supplements may be needed if you have to use diuretics daily. Daily weights can help you keep track of your progress in clearing your edema. You should call your caregiver for follow up care as recommended. SEEK IMMEDIATE MEDICAL CARE IF:   You have increased swelling, pain, redness, or heat in your legs.   You develop shortness of breath, especially when lying down.   You develop chest or abdominal pain, weakness, or fainting.   You have a fever.  Document Released: 05/27/2004 Document Revised: 04/08/2011 Document Reviewed: 05/07/2009 [employer] Patient Information 2012 [employer]. 

## 2011-08-12 ENCOUNTER — Encounter: Payer: Medicaid Other | Attending: Family Medicine | Admitting: Dietician

## 2011-08-12 ENCOUNTER — Encounter: Payer: Self-pay | Admitting: Dietician

## 2011-08-12 VITALS — Ht 63.0 in | Wt 243.6 lb

## 2011-08-12 DIAGNOSIS — E669 Obesity, unspecified: Secondary | ICD-10-CM

## 2011-08-12 DIAGNOSIS — J449 Chronic obstructive pulmonary disease, unspecified: Secondary | ICD-10-CM | POA: Insufficient documentation

## 2011-08-12 DIAGNOSIS — Z713 Dietary counseling and surveillance: Secondary | ICD-10-CM | POA: Insufficient documentation

## 2011-08-12 DIAGNOSIS — J4489 Other specified chronic obstructive pulmonary disease: Secondary | ICD-10-CM | POA: Insufficient documentation

## 2011-08-12 DIAGNOSIS — I10 Essential (primary) hypertension: Secondary | ICD-10-CM | POA: Insufficient documentation

## 2011-08-12 DIAGNOSIS — E119 Type 2 diabetes mellitus without complications: Secondary | ICD-10-CM | POA: Insufficient documentation

## 2011-08-12 NOTE — Patient Instructions (Addendum)
   Product at drug store called mole skin, that can be applied to a tight area especially on the feet.  Check the morning (fasting) blood sugar on Mondays  Check the blood sugar 2 hours after the first bite of breakfast.on Tuesday  Check the blood sugar before lunch on Wednesday  Check the blood sugar before supper on Thursday.  Check the blood sugar 2 hours after the first bite of dinner or before bedtime.on Friday  Check the blood sugar fasting on Saturday, Sunday, and Monday.  Use the plate method for portions.  Keep the carb at 2/3 to 1 cup and then 1/2-1 cup of fruit.  Try to use the suggestions on your handout for cutting fat, carbs, and calories.  Plan to follow up in 8 weeks.

## 2011-08-12 NOTE — Progress Notes (Signed)
  Medical Nutrition Therapy:  Appt start time: 1500 end time:  1600.  Assessment:  Primary concerns today: Wants to lose some weight and to be healthier. Tells me today that she has a history of type 2 diabetes and is taking glucotrol and checking her blood glucose once daily, fasting.  She received previous DM education in the hospital, but has had limited follow-up for her diabetes.  Today, she has a 2-3+ tibial/pretibial edema and has a prescription for Lasix and potassium replacement which she is to do for the next 3 days.   Blood Glucose Levels:  Fasting:106, 110, 120 130 rarely reported to be higher.  Hypoglycemia:  Denies any signs and symptoms of hypoglycemia.  MEDICATIONS: Completed med. reconciliation   DIETARY INTAKE:  24-hr recall:  B (10:00 AM): 2 sausage patties, 2 boiled eggs, 1-2 slices white bread sometimes use jelly on the toast.  Drinks water now, usually will drink regular soda.  Snk (later AM) :have cookies orhoney buns or almond hershey bar with a regular soda. L (2:00-3:00 PM): Oodles-of-noodles or a [place] sandwich with lettuce and tomato and mayo.  Sometimes a BLT sandwich or 2 hot dogs boiled wrapped in 2 slices of bread with mustard Will drink regular soda Snk (late PM): none D (5:00-6:00 PM): Last evening ate a hot dog with the bun and mustard. Most days will cook rice, and fried chicken or fried pork chop, with greens and corn bread,, maybe corn on the cob.  Will drink regular soda Snk (bedtime PM): ice cream sandwich. Or miniature oreo or little debbie cakes, PB wafer cookies,  Beverages:water, soda and V8 Splash   Recent physical activity: Very limited.  Became short of breath just walking down the hall to my office.  Estimated energy needs:HT: 63 in  WT: 243.6  BMI:43.2 Kg/m2  ADJ WT:156 lb (71 kg) 1400-1500 calories 155-160 g carbohydrates 100-105 g protein 37-39 g fat  Progress Towards Goal(s):  No progress.   Nutritional Diagnosis:  [place]-3.3  Overweight/obesity As related to excessive intake of sugar sweetened beverages, sweetened snacks and generally large food portions.  As evidenced by BMI of >40.Marland Kitchen    Intervention:  Nutrition Diet high in concentrated sweets, starchy foods, fried foods and in large portions.  Has a history of DM 2 for approximately 9-12 months.  No formal diabetes diet education.  Given our time constraints for the day, we covered what the carb foods are and how they affect blood sugar levels and cause weight gain.  I prescribed no concentrated sweets in the form of beverages and to use the plate method for moderating carb intake and portion control  Recommended approximately 45-60 gm CHO for meals and 15-30 for snacks.  Handouts given during visit include:  Yellow card with portion sizes and carb categories  Poster demonstrating the plate method of portion control  Novo Nordisk Carb counting book for portion size reference.  Monitoring/Evaluation:  Dietary intake, exercise, blood glucose levels, and body weight in 8 weeks.Marland Kitchen

## 2011-09-28 ENCOUNTER — Emergency Department (HOSPITAL_COMMUNITY)
Admission: EM | Admit: 2011-09-28 | Discharge: 2011-09-29 | Disposition: A | Discharge status: Home / Self Care | Payer: Medicaid Other | Attending: Emergency Medicine | Admitting: Emergency Medicine

## 2011-09-28 ENCOUNTER — Encounter (HOSPITAL_COMMUNITY): Payer: Self-pay

## 2011-09-28 DIAGNOSIS — M79609 Pain in unspecified limb: Secondary | ICD-10-CM | POA: Insufficient documentation

## 2011-09-28 DIAGNOSIS — N189 Chronic kidney disease, unspecified: Secondary | ICD-10-CM | POA: Insufficient documentation

## 2011-09-28 DIAGNOSIS — W19XXXA Unspecified fall, initial encounter: Secondary | ICD-10-CM | POA: Insufficient documentation

## 2011-09-28 DIAGNOSIS — R55 Syncope and collapse: Secondary | ICD-10-CM | POA: Insufficient documentation

## 2011-09-28 DIAGNOSIS — J4489 Other specified chronic obstructive pulmonary disease: Secondary | ICD-10-CM | POA: Insufficient documentation

## 2011-09-28 DIAGNOSIS — J449 Chronic obstructive pulmonary disease, unspecified: Secondary | ICD-10-CM | POA: Insufficient documentation

## 2011-09-28 DIAGNOSIS — M25519 Pain in unspecified shoulder: Secondary | ICD-10-CM | POA: Insufficient documentation

## 2011-09-28 DIAGNOSIS — E119 Type 2 diabetes mellitus without complications: Secondary | ICD-10-CM | POA: Insufficient documentation

## 2011-09-28 DIAGNOSIS — Y92009 Unspecified place in unspecified non-institutional (private) residence as the place of occurrence of the external cause: Secondary | ICD-10-CM | POA: Insufficient documentation

## 2011-09-28 DIAGNOSIS — G4733 Obstructive sleep apnea (adult) (pediatric): Secondary | ICD-10-CM | POA: Insufficient documentation

## 2011-09-28 DIAGNOSIS — I129 Hypertensive chronic kidney disease with stage 1 through stage 4 chronic kidney disease, or unspecified chronic kidney disease: Secondary | ICD-10-CM | POA: Insufficient documentation

## 2011-09-28 DIAGNOSIS — F17200 Nicotine dependence, unspecified, uncomplicated: Secondary | ICD-10-CM | POA: Insufficient documentation

## 2011-09-28 NOTE — ED Notes (Signed)
Pt was brought from EMS. Per EMS patient got upset about a unknown situation and passed out and was shaking, when pt came to she was A&O and stated that she was fine but family insisted on having patient come to the ED for observation.

## 2011-09-29 ENCOUNTER — Emergency Department (HOSPITAL_COMMUNITY): Payer: Medicaid Other

## 2011-09-29 LAB — URINALYSIS, ROUTINE W REFLEX MICROSCOPIC
Hgb urine dipstick: NEGATIVE
Ketones, ur: NEGATIVE mg/dL
Protein, ur: NEGATIVE mg/dL
Urobilinogen, UA: 0.2 mg/dL (ref 0.0–1.0)

## 2011-09-29 LAB — DIFFERENTIAL
Basophils Absolute: 0 10*3/uL (ref 0.0–0.1)
Basophils Relative: 0 % (ref 0–1)
Eosinophils Absolute: 0.2 10*3/uL (ref 0.0–0.7)
Eosinophils Relative: 3 % (ref 0–5)
Lymphocytes Relative: 37 % (ref 12–46)
Monocytes Absolute: 0.6 10*3/uL (ref 0.1–1.0)

## 2011-09-29 LAB — CBC
HCT: 39.7 % (ref 36.0–46.0)
MCHC: 32.7 g/dL (ref 30.0–36.0)
MCV: 83.1 fL (ref 78.0–100.0)
Platelets: 217 10*3/uL (ref 150–400)
RDW: 15.6 % — ABNORMAL HIGH (ref 11.5–15.5)
WBC: 9.3 10*3/uL (ref 4.0–10.5)

## 2011-09-29 LAB — POCT I-STAT, CHEM 8
BUN: 23 mg/dL (ref 6–23)
Chloride: 108 mEq/L (ref 96–112)
Creatinine, Ser: 1.3 mg/dL — ABNORMAL HIGH (ref 0.50–1.10)
Glucose, Bld: 112 mg/dL — ABNORMAL HIGH (ref 70–99)
Hemoglobin: 15 g/dL (ref 12.0–15.0)
Potassium: 4 mEq/L (ref 3.5–5.1)
Sodium: 143 mEq/L (ref 135–145)

## 2011-09-29 LAB — WET PREP, GENITAL: Yeast Wet Prep HPF POC: NONE SEEN

## 2011-09-29 LAB — URINE MICROSCOPIC-ADD ON

## 2011-09-29 NOTE — ED Notes (Signed)
Patient transported to X-ray 

## 2011-09-29 NOTE — Discharge Instructions (Signed)
You're tests have been normal here, please see your doctor in the next one to 2 days for a recheck, return to the hospital for severe or worsening symptoms. Please see the attached reading instructions

## 2011-09-29 NOTE — ED Notes (Signed)
Pt requesting food and drink.  WIll check with MD.  Denies pain at this time.  SR 76 o n monitor.  RA sats 99 %.
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
## 2011-09-29 NOTE — ED Notes (Signed)
Pelvic cart ready. 

## 2011-10-12 ENCOUNTER — Other Ambulatory Visit: Payer: Self-pay | Admitting: Internal Medicine

## 2011-10-12 DIAGNOSIS — R921 Mammographic calcification found on diagnostic imaging of breast: Secondary | ICD-10-CM

## 2011-10-19 ENCOUNTER — Ambulatory Visit
Admission: RE | Admit: 2011-10-19 | Discharge: 2011-10-19 | Disposition: A | Discharge status: Home / Self Care | Payer: Medicaid Other | Source: Ambulatory Visit | Attending: Internal Medicine | Admitting: Internal Medicine

## 2011-10-19 DIAGNOSIS — R921 Mammographic calcification found on diagnostic imaging of breast: Secondary | ICD-10-CM

## 2011-11-22 ENCOUNTER — Other Ambulatory Visit (INDEPENDENT_AMBULATORY_CARE_PROVIDER_SITE_OTHER): Payer: Self-pay | Admitting: General Surgery

## 2011-11-22 ENCOUNTER — Encounter (INDEPENDENT_AMBULATORY_CARE_PROVIDER_SITE_OTHER): Payer: Self-pay | Admitting: General Surgery

## 2011-11-22 ENCOUNTER — Ambulatory Visit (INDEPENDENT_AMBULATORY_CARE_PROVIDER_SITE_OTHER): Payer: Medicaid Other | Admitting: General Surgery

## 2011-11-22 VITALS — BP 132/68 | HR 60 | Temp 96.9°F | Resp 28 | Ht 64.0 in | Wt 238.0 lb

## 2011-11-22 DIAGNOSIS — R921 Mammographic calcification found on diagnostic imaging of breast: Secondary | ICD-10-CM

## 2011-11-22 DIAGNOSIS — R928 Other abnormal and inconclusive findings on diagnostic imaging of breast: Secondary | ICD-10-CM

## 2011-11-22 NOTE — Progress Notes (Signed)
Patient ID: Robin Clayton, female   DOB: 06/30/1955, 55 y.o.   MRN: [id number]  Chief Complaint  Patient presents with  . Follow-up    eval lt breast lesion    HPI Robin Clayton is a 55 y.o. female.  Referred by Dr. Beth Brown HPI This is a 55-year-old female with multiple medical problems who presents after undergoing a screening mammogram that showed left breast upper outer quadrant calcifications. These underwent a core biopsy and the pathology shows a small radial scar. She has been referred for evaluation for excision. She has no complaints referable to her breasts at all. She has no prior history of any breast complaints. She has no family history of any breast cancer or ovarian cancer. She does sleep on pillows at night and uses her CPAP machine. She cannot walk up a flight of stairs. Past Medical History  Diagnosis Date  . Diabetes mellitus   . Hypertension   . Bronchitis   . Obstructive sleep apnea     compliant with cpap  . Chronic renal insufficiency   . COPD (chronic obstructive pulmonary disease)     Past Surgical History  Procedure Date  . Cesarean section 1970    Family History  Problem Relation Age of Onset  . Stomach cancer Mother 65  . Hypertension Mother   . Hyperlipidemia Mother   . Diabetes Mother   . Cancer Mother     stomach  . Colon cancer Neg Hx   . Hypertension Father   . Diabetes Father   . Heart attack Father   . Asthma Sister     Social History History  Substance Use Topics  . Smoking status: Current Everyday Smoker -- 0.5 packs/day    Types: Cigarettes  . Smokeless tobacco: Never Used  . Alcohol Use: No    No Known Allergies  Current Outpatient Prescriptions  Medication Sig Dispense Refill  . albuterol (PROVENTIL) (2.5 MG/3ML) 0.083% nebulizer solution Take 2.5 mg by nebulization 3 (three) times daily as needed. For shortness of breath      . amLODipine (NORVASC) 5 MG tablet Take 5 mg by mouth daily.       . budesonide-formoterol  (SYMBICORT) 160-4.5 MCG/ACT inhaler Inhale 2 puffs into the lungs 6 (six) times daily.       . cloNIDine (CATAPRES) 0.2 MG tablet Take 0.2 mg by mouth 3 (three) times daily.       . glipiZIDE (GLUCOTROL) 5 MG tablet Take 2.5 mg by mouth once.       . HYDROcodone-acetaminophen (VICODIN) 5-500 MG per tablet Take 1 tablet by mouth 3 (three) times daily as needed. For pain        Review of Systems Review of Systems  Constitutional: Negative for fever, chills and unexpected weight change.  HENT: Negative for hearing loss, congestion, sore throat, trouble swallowing and voice change.   Eyes: Negative for visual disturbance.  Respiratory: Positive for cough. Negative for wheezing.   Cardiovascular: Negative for chest pain, palpitations and leg swelling.  Gastrointestinal: Negative for nausea, vomiting, abdominal pain, diarrhea, constipation, blood in stool, abdominal distention and anal bleeding.  Genitourinary: Negative for hematuria, vaginal bleeding and difficulty urinating.  Musculoskeletal: Negative for arthralgias.  Skin: Negative for rash and wound.  Neurological: Negative for seizures, syncope and headaches.  Hematological: Negative for adenopathy. Does not bruise/bleed easily.  Psychiatric/Behavioral: Negative for confusion.    Blood pressure 132/68, pulse 60, temperature 96.9 F (36.1 C), temperature source Temporal, resp. rate 28, height 5'   4" (1.626 m), weight 238 lb (107.956 kg).  Physical Exam Physical Exam  Vitals reviewed. Constitutional: She appears well-developed and well-nourished.  Neck: Neck supple.  Cardiovascular: Normal rate, regular rhythm and normal heart sounds.   Pulmonary/Chest: Effort normal. She has wheezes (occ exp wheezes). Right breast exhibits no inverted nipple, no mass, no nipple discharge, no skin change and no tenderness. Left breast exhibits no inverted nipple, no mass, no nipple discharge, no skin change and no tenderness. Breasts are symmetrical.    Lymphadenopathy:    She has no cervical adenopathy.    Data Reviewed DIGITAL DIAGNOSTIC LEFT BREAST MAMMOGRAM  Comparison: 06/29/2011 baseline screening mammogram.  Findings: Magnification views of the left breast demonstrate a  small (6 mm in greatest dimension) cluster of calcifications within  the lateral left breast (upper-outer quadrant). These are punctate  and relatively coarse and are felt to be most likely dystrophic  calcifications. There are no worrisome linear or branching forms.  I have discussed options of six, 12, 24-month mammographic follow-  up evaluation versus tissue sampling via stereotactic core biopsy.  The patient prefers imaging follow-up evaluation and I agree with  this.  IMPRESSION:  Small cluster of probably benign (probably dystrophic)  calcifications located within the upper-outer quadrant of the left  breast. Recommend follow-up left breast diagnostic mammogram in 6  months.  BI-RADS CATEGORY 3: Probably benign finding(s) - short interval  follow-up suggested.   Assessment    Left breast calcifications and csl on core biopsy    Plan    Left breast wire localization biopsy  We discussed indication to excise calcifications to rule out cancer.  Risks include bleeding, infection, possible further surgery.  We discussed wire placement as well.  I will keep her overnight with her cpap       Robin Clayton 11/22/2011, 2:22 PM    

## 2011-11-22 NOTE — Patient Instructions (Signed)
[facility] Office Phone Number [phone]  BREAST BIOPSY: POST OP INSTRUCTIONS  Always review your discharge instruction sheet given to you by the facility where your surgery was performed.  IF YOU HAVE DISABILITY OR FAMILY LEAVE FORMS, YOU MUST BRING THEM TO THE OFFICE FOR PROCESSING.  DO NOT GIVE THEM TO YOUR DOCTOR.  1. A prescription for pain medication may be given to you upon discharge.  Take your pain medication as prescribed, if needed.  If narcotic pain medicine is not needed, then you may take acetaminophen (Tylenol), naprosyn (Alleve) or ibuprofen (Advil) as needed. 2. Take your usually prescribed medications unless otherwise directed 3. If you need a refill on your pain medication, please contact your pharmacy.  They will contact our office to request authorization.  Prescriptions will not be filled after 5pm or on week-ends. 4. You should eat very light the first 24 hours after surgery, such as soup, crackers, pudding, etc.  Resume your normal diet the day after surgery. 5. Most patients will experience some swelling and bruising in the breast.  Ice packs and a good support bra will help.  Wear the breast binder provided or a sports bra for 72 hours day and night.  After that wear a sports bra during the day until you return to the office. Swelling and bruising can take several days to resolve.  6. It is common to experience some constipation if taking pain medication after surgery.  Increasing fluid intake and taking a stool softener will usually help or prevent this problem from occurring.  A mild laxative (Milk of Magnesia or Miralax) should be taken according to package directions if there are no bowel movements after 48 hours. 7. Unless discharge instructions indicate otherwise, you may remove your bandages 48 hours after surgery and you may shower at that time.  You may have steri-strips (small skin tapes) in place directly over the incision.  These strips should  be left on the skin for 7-10 days and will come off on their own.  If your surgeon used skin glue on the incision, you may shower in 24 hours.  The glue will flake off over the next 2-3 weeks.  Any sutures or staples will be removed at the office during your follow-up visit. 8. ACTIVITIES:  You may resume regular daily activities (gradually increasing) beginning the next day.  Wearing a good support bra or sports bra minimizes pain and swelling.  You may have sexual intercourse when it is comfortable. a. You may drive when you no longer are taking prescription pain medication, you can comfortably wear a seatbelt, and you can safely maneuver your car and apply brakes. b. RETURN TO WORK:  ______________________________________________________________________________________ 9. You should see your doctor in the office for a follow-up appointment approximately two weeks after your surgery.  Your doctor's nurse will typically make your follow-up appointment when she calls you with your pathology report.  Expect your pathology report 3-4 business days after your surgery.  You may call to check if you do not hear from [place] after three days. 10. OTHER INSTRUCTIONS: _______________________________________________________________________________________________ _____________________________________________________________________________________________________________________________________ _____________________________________________________________________________________________________________________________________ _____________________________________________________________________________________________________________________________________  WHEN TO CALL DR Heavenly Christine: 1. Fever over 101.0 2. Nausea and/or vomiting. 3. Extreme swelling or bruising. 4. Continued bleeding from incision. 5. Increased pain, redness, or drainage from the incision.  The clinic staff is available to answer your questions  during regular business hours.  Please don't hesitate to call and ask to speak to one of the nurses for clinical concerns.  If you have a medical emergency, go to the nearest emergency room or call 911.  A surgeon from [facility] is always on call at the hospital.  For further questions, please visit [email or web address]

## 2011-11-23 ENCOUNTER — Encounter (HOSPITAL_COMMUNITY): Payer: Self-pay | Admitting: Pharmacy Technician

## 2011-11-29 ENCOUNTER — Encounter (HOSPITAL_COMMUNITY)
Admission: RE | Admit: 2011-11-29 | Discharge: 2011-11-29 | Payer: Medicaid Other | Source: Ambulatory Visit | Attending: General Surgery | Admitting: General Surgery

## 2011-11-29 NOTE — Pre-Procedure Instructions (Signed)
20 Robin Clayton  11/29/2011   Your procedure is scheduled on:  12-06-2011  Report to [facility] Stay [facility] at 7:45 AM.  Call this number if you have problems the morning of surgery: [phone]   Remember:   Do not eat food or drink:After Midnight.     Take these medicines the morning of surgery with A SIP OF WATER: Nebulizer as needed,amlodipine(Norvasc),Symbicort inhaler,clonidine(Catapres),pain medication as needed   Do not wear jewelry, make-up or nail polish.  Do not wear lotions, powders, or perfumes. You may wear deodorant.  Do not shave 48 hours prior to surgery. Men may shave face and neck.  Do not bring valuables to the hospital.  Contacts, dentures or bridgework may not be worn into surgery.  Leave suitcase in the car. After surgery it may be brought to your room.  For patients admitted to the hospital, checkout time is 11:00 AM the day of discharge.   Patients discharged the day of surgery will not be allowed to drive home.  Name and phone number of your driver:   Special Instructions: CHG Shower Use Special Wash: 1/2 bottle night before surgery and 1/2 bottle morning of surgery.   Please read over the following fact sheets that you were given: Pain Booklet, Coughing and Deep Breathing, MRSA Information and Surgical Site Infection Prevention

## 2011-12-03 ENCOUNTER — Encounter (HOSPITAL_COMMUNITY): Payer: Self-pay

## 2011-12-03 ENCOUNTER — Encounter (HOSPITAL_COMMUNITY)
Admission: RE | Admit: 2011-12-03 | Discharge: 2011-12-03 | Disposition: A | Discharge status: Home / Self Care | Payer: Medicaid Other | Source: Ambulatory Visit | Attending: General Surgery | Admitting: General Surgery

## 2011-12-03 HISTORY — DX: Gastro-esophageal reflux disease without esophagitis: K21.9

## 2011-12-03 HISTORY — DX: Constipation, unspecified: K59.00

## 2011-12-03 LAB — CBC WITH DIFFERENTIAL/PLATELET
Basophils Absolute: 0 10*3/uL (ref 0.0–0.1)
Eosinophils Relative: 3 % (ref 0–5)
HCT: 40.3 % (ref 36.0–46.0)
Hemoglobin: 13.2 g/dL (ref 12.0–15.0)
Lymphocytes Relative: 39 % (ref 12–46)
MCV: 83.6 fL (ref 78.0–100.0)
Monocytes Absolute: 0.6 10*3/uL (ref 0.1–1.0)
Monocytes Relative: 8 % (ref 3–12)
Neutro Abs: 3.5 10*3/uL (ref 1.7–7.7)
RDW: 15.1 % (ref 11.5–15.5)
WBC: 7 10*3/uL (ref 4.0–10.5)

## 2011-12-03 LAB — SURGICAL PCR SCREEN
MRSA, PCR: NEGATIVE
Staphylococcus aureus: NEGATIVE

## 2011-12-03 LAB — BASIC METABOLIC PANEL
BUN: 14 mg/dL (ref 6–23)
CO2: 29 mEq/L (ref 19–32)
Chloride: 102 mEq/L (ref 96–112)
Creatinine, Ser: 1.3 mg/dL — ABNORMAL HIGH (ref 0.50–1.10)
GFR calc Af Amer: 53 mL/min — ABNORMAL LOW (ref >90)
Potassium: 4.1 mEq/L (ref 3.5–5.1)

## 2011-12-03 NOTE — Progress Notes (Addendum)
I left a message on voice mail at [facility] requesting labs, last office note, 2D Echo, stress test. EKG.

## 2011-12-05 MED ORDER — CEFAZOLIN SODIUM-DEXTROSE 2-3 GM-% IV SOLR
2.0000 g | INTRAVENOUS | Status: AC
  Administered 2011-12-06: 2 g via INTRAVENOUS
  Filled 2011-12-05: qty 50

## 2011-12-06 ENCOUNTER — Ambulatory Visit
Admission: RE | Admit: 2011-12-06 | Discharge: 2011-12-06 | Disposition: A | Discharge status: Home / Self Care | Payer: Medicaid Other | Source: Ambulatory Visit | Attending: General Surgery | Admitting: General Surgery

## 2011-12-06 ENCOUNTER — Encounter (HOSPITAL_COMMUNITY): Payer: Self-pay | Admitting: Anesthesiology

## 2011-12-06 ENCOUNTER — Encounter (HOSPITAL_COMMUNITY): Payer: Self-pay | Admitting: General Practice

## 2011-12-06 ENCOUNTER — Encounter (HOSPITAL_COMMUNITY): Payer: Self-pay | Admitting: *Deleted

## 2011-12-06 ENCOUNTER — Ambulatory Visit (HOSPITAL_COMMUNITY): Payer: Medicaid Other | Admitting: Anesthesiology

## 2011-12-06 ENCOUNTER — Encounter (HOSPITAL_COMMUNITY)
Admission: RE | Disposition: A | Discharge status: Home / Self Care | Payer: Self-pay | Source: Ambulatory Visit | Attending: General Surgery

## 2011-12-06 ENCOUNTER — Ambulatory Visit (HOSPITAL_COMMUNITY)
Admission: RE | Admit: 2011-12-06 | Discharge: 2011-12-07 | Disposition: A | Discharge status: Home / Self Care | Payer: Medicaid Other | Source: Ambulatory Visit | Attending: General Surgery | Admitting: General Surgery

## 2011-12-06 DIAGNOSIS — Z01812 Encounter for preprocedural laboratory examination: Secondary | ICD-10-CM | POA: Insufficient documentation

## 2011-12-06 DIAGNOSIS — F17200 Nicotine dependence, unspecified, uncomplicated: Secondary | ICD-10-CM | POA: Insufficient documentation

## 2011-12-06 DIAGNOSIS — N6019 Diffuse cystic mastopathy of unspecified breast: Secondary | ICD-10-CM

## 2011-12-06 DIAGNOSIS — J4489 Other specified chronic obstructive pulmonary disease: Secondary | ICD-10-CM | POA: Insufficient documentation

## 2011-12-06 DIAGNOSIS — R921 Mammographic calcification found on diagnostic imaging of breast: Secondary | ICD-10-CM

## 2011-12-06 DIAGNOSIS — D249 Benign neoplasm of unspecified breast: Secondary | ICD-10-CM

## 2011-12-06 DIAGNOSIS — K219 Gastro-esophageal reflux disease without esophagitis: Secondary | ICD-10-CM | POA: Insufficient documentation

## 2011-12-06 DIAGNOSIS — I10 Essential (primary) hypertension: Secondary | ICD-10-CM | POA: Insufficient documentation

## 2011-12-06 DIAGNOSIS — J449 Chronic obstructive pulmonary disease, unspecified: Secondary | ICD-10-CM | POA: Insufficient documentation

## 2011-12-06 HISTORY — DX: Low back pain: M54.5

## 2011-12-06 HISTORY — DX: Unspecified chronic bronchitis: J42

## 2011-12-06 HISTORY — DX: Other chronic pain: G89.29

## 2011-12-06 HISTORY — PX: BREAST BIOPSY: SHX20

## 2011-12-06 HISTORY — DX: Low back pain, unspecified: M54.50

## 2011-12-06 HISTORY — DX: Type 2 diabetes mellitus without complications: E11.9

## 2011-12-06 LAB — GLUCOSE, CAPILLARY: Glucose-Capillary: 105 mg/dL — ABNORMAL HIGH (ref 70–99)

## 2011-12-06 SURGERY — BREAST BIOPSY WITH NEEDLE LOCALIZATION
Anesthesia: General | Site: Breast | Laterality: Left | Wound class: Clean

## 2011-12-06 MED ORDER — PROPOFOL 10 MG/ML IV EMUL
INTRAVENOUS | Status: DC | PRN
  Administered 2011-12-06: 200 mg via INTRAVENOUS

## 2011-12-06 MED ORDER — CLONIDINE HCL 0.2 MG PO TABS
0.2000 mg | ORAL_TABLET | Freq: Three times a day (TID) | ORAL | Status: DC
  Administered 2011-12-06 – 2011-12-07 (×3): 0.2 mg via ORAL
  Filled 2011-12-06 (×5): qty 1

## 2011-12-06 MED ORDER — ALBUTEROL SULFATE (5 MG/ML) 0.5% IN NEBU: 2.5000 mg | INHALATION_SOLUTION | RESPIRATORY_TRACT | Status: DC | PRN

## 2011-12-06 MED ORDER — FENTANYL CITRATE 0.05 MG/ML IJ SOLN: 50.0000 ug | INTRAMUSCULAR | Status: DC | PRN

## 2011-12-06 MED ORDER — LACTATED RINGERS IV SOLN
INTRAVENOUS | Status: DC | PRN
  Administered 2011-12-06 (×2): via INTRAVENOUS

## 2011-12-06 MED ORDER — EPHEDRINE SULFATE 50 MG/ML IJ SOLN
INTRAMUSCULAR | Status: DC | PRN
  Administered 2011-12-06: 20 mg via INTRAVENOUS

## 2011-12-06 MED ORDER — LACTATED RINGERS IV SOLN
INTRAVENOUS | Status: DC
  Administered 2011-12-06: 10:00:00 via INTRAVENOUS

## 2011-12-06 MED ORDER — MIDAZOLAM HCL 2 MG/2ML IJ SOLN: 1.0000 mg | INTRAMUSCULAR | Status: DC | PRN

## 2011-12-06 MED ORDER — 0.9 % SODIUM CHLORIDE (POUR BTL) OPTIME
TOPICAL | Status: DC | PRN
  Administered 2011-12-06: 1000 mL

## 2011-12-06 MED ORDER — ONDANSETRON HCL 4 MG/2ML IJ SOLN: 4.0000 mg | Freq: Four times a day (QID) | INTRAMUSCULAR | Status: DC | PRN

## 2011-12-06 MED ORDER — MORPHINE SULFATE 2 MG/ML IJ SOLN: 2.0000 mg | INTRAMUSCULAR | Status: DC | PRN

## 2011-12-06 MED ORDER — HYDROCHLOROTHIAZIDE 25 MG PO TABS
25.0000 mg | ORAL_TABLET | Freq: Every day | ORAL | Status: DC
  Administered 2011-12-07: 25 mg via ORAL
  Filled 2011-12-06: qty 1

## 2011-12-06 MED ORDER — HEPARIN SODIUM (PORCINE) 5000 UNIT/ML IJ SOLN
5000.0000 [IU] | Freq: Three times a day (TID) | INTRAMUSCULAR | Status: DC
  Administered 2011-12-06 – 2011-12-07 (×3): 5000 [IU] via SUBCUTANEOUS
  Filled 2011-12-06 (×6): qty 1

## 2011-12-06 MED ORDER — BUPIVACAINE-EPINEPHRINE PF 0.25-1:200000 % IJ SOLN
INTRAMUSCULAR | Status: AC
  Filled 2011-12-06: qty 30

## 2011-12-06 MED ORDER — ONDANSETRON HCL 4 MG/2ML IJ SOLN
INTRAMUSCULAR | Status: DC | PRN
  Administered 2011-12-06: 4 mg via INTRAVENOUS

## 2011-12-06 MED ORDER — METHYLPREDNISOLONE SODIUM SUCC 125 MG IJ SOLR
INTRAMUSCULAR | Status: DC | PRN
  Administered 2011-12-06: 125 mg via INTRAVENOUS

## 2011-12-06 MED ORDER — SCOPOLAMINE 1 MG/3DAYS TD PT72
1.0000 | MEDICATED_PATCH | Freq: Once | TRANSDERMAL | Status: DC
  Filled 2011-12-06: qty 1

## 2011-12-06 MED ORDER — BUDESONIDE-FORMOTEROL FUMARATE 160-4.5 MCG/ACT IN AERO
2.0000 | INHALATION_SPRAY | Freq: Every day | RESPIRATORY_TRACT | Status: DC
Start: 2011-12-06 — End: 2011-12-06
  Filled 2011-12-06: qty 6

## 2011-12-06 MED ORDER — GLIPIZIDE 2.5 MG HALF TABLET
2.5000 mg | ORAL_TABLET | Freq: Every day | ORAL | Status: DC
  Administered 2011-12-07: 2.5 mg via ORAL
  Filled 2011-12-06 (×3): qty 1

## 2011-12-06 MED ORDER — ACETAMINOPHEN 650 MG RE SUPP: 650.0000 mg | Freq: Four times a day (QID) | RECTAL | Status: DC | PRN

## 2011-12-06 MED ORDER — AMLODIPINE BESYLATE 10 MG PO TABS
10.0000 mg | ORAL_TABLET | Freq: Every day | ORAL | Status: DC
  Administered 2011-12-07: 10 mg via ORAL
  Filled 2011-12-06: qty 1

## 2011-12-06 MED ORDER — SUCCINYLCHOLINE CHLORIDE 20 MG/ML IJ SOLN
INTRAMUSCULAR | Status: DC | PRN
  Administered 2011-12-06: 160 mg via INTRAVENOUS

## 2011-12-06 MED ORDER — BUPIVACAINE-EPINEPHRINE 0.25% -1:200000 IJ SOLN
INTRAMUSCULAR | Status: DC | PRN
  Administered 2011-12-06: 20 mL

## 2011-12-06 MED ORDER — FENTANYL CITRATE 0.05 MG/ML IJ SOLN
INTRAMUSCULAR | Status: DC | PRN
  Administered 2011-12-06: 100 ug via INTRAVENOUS

## 2011-12-06 MED ORDER — BUDESONIDE-FORMOTEROL FUMARATE 160-4.5 MCG/ACT IN AERO
2.0000 | INHALATION_SPRAY | Freq: Two times a day (BID) | RESPIRATORY_TRACT | Status: DC
  Administered 2011-12-06 – 2011-12-07 (×2): 2 via RESPIRATORY_TRACT
  Filled 2011-12-06: qty 6

## 2011-12-06 MED ORDER — FENTANYL CITRATE 0.05 MG/ML IJ SOLN: 25.0000 ug | INTRAMUSCULAR | Status: DC | PRN

## 2011-12-06 MED ORDER — HYDROCODONE-ACETAMINOPHEN 10-325 MG PO TABS
1.0000 | ORAL_TABLET | ORAL | Status: DC | PRN
  Administered 2011-12-07: 1 via ORAL
  Filled 2011-12-06: qty 1

## 2011-12-06 MED ORDER — SODIUM CHLORIDE 0.9 % IV SOLN
INTRAVENOUS | Status: DC
  Administered 2011-12-06: 15:00:00 via INTRAVENOUS

## 2011-12-06 MED ORDER — PROMETHAZINE HCL 25 MG/ML IJ SOLN: 6.2500 mg | INTRAMUSCULAR | Status: DC | PRN

## 2011-12-06 MED ORDER — ACETAMINOPHEN 325 MG PO TABS
650.0000 mg | ORAL_TABLET | Freq: Four times a day (QID) | ORAL | Status: DC | PRN
  Administered 2011-12-06: 650 mg via ORAL
  Filled 2011-12-06: qty 2

## 2011-12-06 SURGICAL SUPPLY — 46 items
ADH SKN CLS APL DERMABOND .7 (GAUZE/BANDAGES/DRESSINGS)
APL SKNCLS STERI-STRIP NONHPOA (GAUZE/BANDAGES/DRESSINGS) ×1
APPLIER CLIP 9.375 MED OPEN (MISCELLANEOUS)
APR CLP MED 9.3 20 MLT OPN (MISCELLANEOUS)
BENZOIN TINCTURE PRP APPL 2/3 (GAUZE/BANDAGES/DRESSINGS) ×1 IMPLANT
BINDER BREAST LRG (GAUZE/BANDAGES/DRESSINGS) IMPLANT
BINDER BREAST XLRG (GAUZE/BANDAGES/DRESSINGS) IMPLANT
CANISTER SUCTION 2500CC (MISCELLANEOUS) IMPLANT
CHLORAPREP W/TINT 26ML (MISCELLANEOUS) ×2 IMPLANT
CLIP APPLIE 9.375 MED OPEN (MISCELLANEOUS) IMPLANT
CLOTH BEACON ORANGE TIMEOUT ST (SAFETY) ×2 IMPLANT
COVER SURGICAL LIGHT HANDLE (MISCELLANEOUS) ×2 IMPLANT
DECANTER SPIKE VIAL GLASS SM (MISCELLANEOUS) ×1 IMPLANT
DERMABOND ADVANCED (GAUZE/BANDAGES/DRESSINGS)
DERMABOND ADVANCED .7 DNX12 (GAUZE/BANDAGES/DRESSINGS) IMPLANT
DEVICE DUBIN SPECIMEN MAMMOGRA (MISCELLANEOUS) ×2 IMPLANT
DRAPE CHEST BREAST 15X10 FENES (DRAPES) ×2 IMPLANT
DRSG TEGADERM 4X4.75 (GAUZE/BANDAGES/DRESSINGS) ×1 IMPLANT
ELECT CAUTERY BLADE 6.4 (BLADE) ×2 IMPLANT
ELECT REM PT RETURN 9FT ADLT (ELECTROSURGICAL) ×2
ELECTRODE REM PT RTRN 9FT ADLT (ELECTROSURGICAL) ×1 IMPLANT
GAUZE SPONGE 2X2 8PLY STRL LF (GAUZE/BANDAGES/DRESSINGS) IMPLANT
GLOVE BIO SURGEON STRL SZ7 (GLOVE) ×2 IMPLANT
GLOVE BIOGEL PI IND STRL 7.0 (GLOVE) IMPLANT
GLOVE BIOGEL PI IND STRL 7.5 (GLOVE) ×1 IMPLANT
GLOVE BIOGEL PI INDICATOR 7.0 (GLOVE) ×2
GLOVE BIOGEL PI INDICATOR 7.5 (GLOVE) ×1
GLOVE SURG SS PI 7.0 STRL IVOR (GLOVE) ×2 IMPLANT
GOWN STRL NON-REIN LRG LVL3 (GOWN DISPOSABLE) ×4 IMPLANT
KIT BASIN OR (CUSTOM PROCEDURE TRAY) ×2 IMPLANT
KIT MARKER MARGIN INK (KITS) IMPLANT
KIT ROOM TURNOVER OR (KITS) ×2 IMPLANT
NDL HYPO 25GX1X1/2 BEV (NEEDLE) ×1 IMPLANT
NEEDLE HYPO 25GX1X1/2 BEV (NEEDLE) ×2 IMPLANT
NS IRRIG 1000ML POUR BTL (IV SOLUTION) ×2 IMPLANT
PACK GENERAL/GYN (CUSTOM PROCEDURE TRAY) ×2 IMPLANT
PAD ARMBOARD 7.5X6 YLW CONV (MISCELLANEOUS) ×4 IMPLANT
SPONGE GAUZE 2X2 STER 10/PKG (GAUZE/BANDAGES/DRESSINGS) ×1
STAPLER VISISTAT 35W (STAPLE) ×2 IMPLANT
STRIP CLOSURE SKIN 1/2X4 (GAUZE/BANDAGES/DRESSINGS) ×1 IMPLANT
SUT MNCRL AB 4-0 PS2 18 (SUTURE) ×2 IMPLANT
SUT SILK 2 0 SH (SUTURE) ×1 IMPLANT
SUT VIC AB 3-0 SH 18 (SUTURE) ×2 IMPLANT
SYR CONTROL 10ML LL (SYRINGE) ×2 IMPLANT
TOWEL OR 17X24 6PK STRL BLUE (TOWEL DISPOSABLE) ×2 IMPLANT
TOWEL OR 17X26 10 PK STRL BLUE (TOWEL DISPOSABLE) ×2 IMPLANT

## 2011-12-06 NOTE — Anesthesia Preprocedure Evaluation (Signed)
Anesthesia Evaluation  Patient identified by MRN, date of birth, ID band Patient awake    Reviewed: Allergy & Precautions, H&P , NPO status , Patient's Chart, lab work & pertinent test results  Airway Mallampati: II TM Distance: >3 FB Neck ROM: Full    Dental   Pulmonary sleep apnea , COPDCurrent Smoker,  + rhonchi         Cardiovascular hypertension,     Neuro/Psych    GI/Hepatic GERD-  ,  Endo/Other  Hyperthyroidism Morbid obesity  Renal/GU      Musculoskeletal   Abdominal (+) + obese,   Peds  Hematology   Anesthesia Other Findings   Reproductive/Obstetrics                           Anesthesia Physical Anesthesia Plan  ASA: III  Anesthesia Plan: General   Post-op Pain Management:    Induction: Intravenous  Airway Management Planned: LMA  Additional Equipment:   Intra-op Plan:   Post-operative Plan: Extubation in OR  Informed Consent: I have reviewed the patients History and Physical, chart, labs and discussed the procedure including the risks, benefits and alternatives for the proposed anesthesia with the patient or authorized representative who has indicated his/her understanding and acceptance.     Plan Discussed with: CRNA and Surgeon  Anesthesia Plan Comments:         Anesthesia Quick Evaluation

## 2011-12-06 NOTE — Preoperative (Signed)
Beta Blockers   Reason not to administer Beta Blockers:Not Applicable 

## 2011-12-06 NOTE — Op Note (Signed)
Preoperative diagnosis: Left breast mammographic abnormality with complex sclerosing lesion on core biopsy Postoperative diagnosis: Same as above Procedure: Left breast wire localization biopsy Surgeon: Dr. Harden Mo Anesthesia: Gen. Endotracheal Specimens: Left breast tissue marked short stitch superior, long stitch lateral, double stitch deep estimated blood loss: Minimal Complications: None Sponge needle count correct x2 at end of operation Disposition to recovery in stable condition  Indications: This is a 56 year female with multiple medical problems who presented after undergoing a screening mammogram with a  left breast abnormality. This underwent a core biopsy which showed a small radial scar. She was then referred to me for discussion of an excisional biopsy. She and I discussed a wire localized excisional biopsy. We plan on keeping her overnight due to her comorbidities.  Procedure: After informed consent was obtained the patient first had a wire placed at the breast center. I had these mammograms available for my review in the operating room. She was then placed under anesthesia with an LMA. This was then transitioned to an endotracheal tube which did not really tolerate the LMA well. She was administered 2 g of intravenous cefazolin. She had sequential compression devices on her legs prior to going to sleep as well. Her left breast was then prepped and draped in the standard sterile surgical fashion. A surgical timeout was then performed.  I made a radial incision overlying the wire tract. I then used cautery to remove the wire as well as the surrounding tissue. A Faxitron mammogram was taken confirming removal of the wire and the clip. I then closed the breast with 2-0 Vicryl, 3-0 Vicryl, and 4-0 Monocryl. I infiltrated 20 cc of quarter percent Marcaine. Steri-Strips and a sterile dressing were placed. She tolerated this well and was transferred to recovery stable.

## 2011-12-06 NOTE — Anesthesia Postprocedure Evaluation (Signed)
  Anesthesia Post-op Note  Patient: Robin Clayton  Procedure(s) Performed: Procedure(s) (LRB): BREAST BIOPSY WITH NEEDLE LOCALIZATION (Left)  Patient Location: PACU  Anesthesia Type: General  Level of Consciousness: awake and alert   Airway and Oxygen Therapy: Patient Spontanous Breathing  Post-op Pain: mild  Post-op Assessment: Post-op Vital signs reviewed, Patient's Cardiovascular Status Stable, Respiratory Function Stable, Patent Airway, No signs of Nausea or vomiting and Pain level controlled  Post-op Vital Signs: stable  Complications: No apparent anesthesia complications

## 2011-12-06 NOTE — Interval H&P Note (Signed)
History and Physical Interval Note:  12/06/2011 9:55 AM  Robin Clayton  has presented today for surgery, with the diagnosis of left breast calcification  The various methods of treatment have been discussed with the patient and family. After consideration of risks, benefits and other options for treatment, the patient has consented to  Procedure(s) (LRB): BREAST BIOPSY WITH NEEDLE LOCALIZATION (Left) as a surgical intervention .  The patient's history has been reviewed, patient examined, no change in status, stable for surgery.  I have reviewed the patient's chart and labs.  Questions were answered to the patient's satisfaction.     Brenda Cowher

## 2011-12-06 NOTE — Transfer of Care (Signed)
Immediate Anesthesia Transfer of Care Note  Patient: Robin Clayton  Procedure(s) Performed: Procedure(s) (LRB): BREAST BIOPSY WITH NEEDLE LOCALIZATION (Left)  Patient Location: PACU  Anesthesia Type: General  Level of Consciousness: awake, alert  and oriented  Airway & Oxygen Therapy: Patient Spontanous Breathing and Patient connected to face mask oxygen  Post-op Assessment: Report given to PACU RN and Post -op Vital signs reviewed and stable  Post vital signs: Reviewed and stable  Complications: No apparent anesthesia complications

## 2011-12-06 NOTE — H&P (View-Only) (Signed)
Patient ID: Robin Clayton, female   DOB: March 06, 1956, 56 y.o.   MRN: [id number]  Chief Complaint  Patient presents with  . Follow-up    eval lt breast lesion    HPI Robin Clayton is a 56 y.o. female.  Referred by Dr. Rosalie Gums HPI This is a 56 year old female with multiple medical problems who presents after undergoing a screening mammogram that showed left breast upper outer quadrant calcifications. These underwent a core biopsy and the pathology shows a small radial scar. She has been referred for evaluation for excision. She has no complaints referable to her breasts at all. She has no prior history of any breast complaints. She has no family history of any breast cancer or ovarian cancer. She does sleep on pillows at night and uses her CPAP machine. She cannot walk up a flight of stairs. Past Medical History  Diagnosis Date  . Diabetes mellitus   . Hypertension   . Bronchitis   . Obstructive sleep apnea     compliant with cpap  . Chronic renal insufficiency   . COPD (chronic obstructive pulmonary disease)     Past Surgical History  Procedure Date  . Cesarean section 1970    Family History  Problem Relation Age of Onset  . Stomach cancer Mother 6  . Hypertension Mother   . Hyperlipidemia Mother   . Diabetes Mother   . Cancer Mother     stomach  . Colon cancer Neg Hx   . Hypertension Father   . Diabetes Father   . Heart attack Father   . Asthma Sister     Social History History  Substance Use Topics  . Smoking status: Current Everyday Smoker -- 0.5 packs/day    Types: Cigarettes  . Smokeless tobacco: Never Used  . Alcohol Use: No    No Known Allergies  Current Outpatient Prescriptions  Medication Sig Dispense Refill  . albuterol (PROVENTIL) (2.5 MG/3ML) 0.083% nebulizer solution Take 2.5 mg by nebulization 3 (three) times daily as needed. For shortness of breath      . amLODipine (NORVASC) 5 MG tablet Take 5 mg by mouth daily.       . budesonide-formoterol  (SYMBICORT) 160-4.5 MCG/ACT inhaler Inhale 2 puffs into the lungs 6 (six) times daily.       . cloNIDine (CATAPRES) 0.2 MG tablet Take 0.2 mg by mouth 3 (three) times daily.       Marland Kitchen glipiZIDE (GLUCOTROL) 5 MG tablet Take 2.5 mg by mouth once.       Marland Kitchen HYDROcodone-acetaminophen (VICODIN) 5-500 MG per tablet Take 1 tablet by mouth 3 (three) times daily as needed. For pain        Review of Systems Review of Systems  Constitutional: Negative for fever, chills and unexpected weight change.  HENT: Negative for hearing loss, congestion, sore throat, trouble swallowing and voice change.   Eyes: Negative for visual disturbance.  Respiratory: Positive for cough. Negative for wheezing.   Cardiovascular: Negative for chest pain, palpitations and leg swelling.  Gastrointestinal: Negative for nausea, vomiting, abdominal pain, diarrhea, constipation, blood in stool, abdominal distention and anal bleeding.  Genitourinary: Negative for hematuria, vaginal bleeding and difficulty urinating.  Musculoskeletal: Negative for arthralgias.  Skin: Negative for rash and wound.  Neurological: Negative for seizures, syncope and headaches.  Hematological: Negative for adenopathy. Does not bruise/bleed easily.  Psychiatric/Behavioral: Negative for confusion.    Blood pressure 132/68, pulse 60, temperature 96.9 F (36.1 C), temperature source Temporal, resp. rate 28, height 5'  4" (1.626 m), weight 238 lb (107.956 kg).  Physical Exam Physical Exam  Vitals reviewed. Constitutional: She appears well-developed and well-nourished.  Neck: Neck supple.  Cardiovascular: Normal rate, regular rhythm and normal heart sounds.   Pulmonary/Chest: Effort normal. She has wheezes (occ exp wheezes). Right breast exhibits no inverted nipple, no mass, no nipple discharge, no skin change and no tenderness. Left breast exhibits no inverted nipple, no mass, no nipple discharge, no skin change and no tenderness. Breasts are symmetrical.    Lymphadenopathy:    She has no cervical adenopathy.    Data Reviewed DIGITAL DIAGNOSTIC LEFT BREAST MAMMOGRAM  Comparison: 06/29/2011 baseline screening mammogram.  Findings: Magnification views of the left breast demonstrate a  small (6 mm in greatest dimension) cluster of calcifications within  the lateral left breast (upper-outer quadrant). These are punctate  and relatively coarse and are felt to be most likely dystrophic  calcifications. There are no worrisome linear or branching forms.  I have discussed options of six, 12, 85-month mammographic follow-  up evaluation versus tissue sampling via stereotactic core biopsy.  The patient prefers imaging follow-up evaluation and I agree with  this.  IMPRESSION:  Small cluster of probably benign (probably dystrophic)  calcifications located within the upper-outer quadrant of the left  breast. Recommend follow-up left breast diagnostic mammogram in 6  months.  BI-RADS CATEGORY 3: Probably benign finding(s) - short interval  follow-up suggested.   Assessment    Left breast calcifications and csl on core biopsy    Plan    Left breast wire localization biopsy  We discussed indication to excise calcifications to rule out cancer.  Risks include bleeding, infection, possible further surgery.  We discussed wire placement as well.  I will keep her overnight with her cpap       Robin Clayton 11/22/2011, 2:22 PM

## 2011-12-07 ENCOUNTER — Telehealth (INDEPENDENT_AMBULATORY_CARE_PROVIDER_SITE_OTHER): Payer: Self-pay

## 2011-12-07 ENCOUNTER — Encounter (HOSPITAL_COMMUNITY): Payer: Self-pay | Admitting: General Surgery

## 2011-12-07 MED ORDER — HYDROCODONE-ACETAMINOPHEN 10-325 MG PO TABS: 1.0000 | ORAL_TABLET | Freq: Four times a day (QID) | ORAL | Status: AC | PRN

## 2011-12-07 MED ORDER — HYDROCODONE-ACETAMINOPHEN 10-325 MG PO TABS: 1.0000 | ORAL_TABLET | ORAL | Status: DC | PRN

## 2011-12-07 MED ORDER — HYDROCODONE-ACETAMINOPHEN 10-325 MG PO TABS: 1.0000 | ORAL_TABLET | Freq: Four times a day (QID) | ORAL | Status: DC | PRN

## 2011-12-07 NOTE — Telephone Encounter (Signed)
LMOM for pt to call me so I can give her the path report. 

## 2011-12-07 NOTE — Progress Notes (Signed)
Discharge home.

## 2011-12-07 NOTE — Discharge Summary (Signed)
Physician Discharge Summary  Patient ID: Robin Clayton MRN: [id number] DOB/AGE: [birth year] 56 y.o.  Admit date: 12/06/2011 Discharge date: 12/07/2011  Admission Diagnoses: Left breast mammographic abnormality COPD  Discharge Diagnoses:  Same as above  Discharged Condition: good  Hospital Course: 89 yof with copd who presents for left breast wire localization biopsy of radial scar on core biopsy.  She underwent without difficulty.  She remained overnight due to pulmonary status and is doing well the following day.  Consults: None  Significant Diagnostic Studies: none  Treatments: surgery: left breast wire localization biopsy  Discharge Exam: Blood pressure 129/69, pulse 75, temperature 98.2 F (36.8 C), temperature source Oral, resp. rate 20, height 5\' 4"  (1.626 m), weight 247 lb 2.2 oz (112.1 kg), SpO2 95.00%. Dressing dry   Disposition: 01-Home or Self Care  Discharge Orders    Future Appointments: Provider: Department: Dept Phone: Center:   12/17/2011 9:40 AM Emelia Loron, MD [facility] [phone] None     Medication List  As of 12/07/2011  9:07 AM   TAKE these medications         albuterol (2.5 MG/3ML) 0.083% nebulizer solution   Commonly known as: PROVENTIL   Take 2.5 mg by nebulization 3 (three) times daily as needed. For shortness of breath      amLODipine 5 MG tablet   Commonly known as: NORVASC   Take 10 mg by mouth daily.      budesonide-formoterol 160-4.5 MCG/ACT inhaler   Commonly known as: SYMBICORT   Inhale 2 puffs into the lungs 6 (six) times daily.      cloNIDine 0.2 MG tablet   Commonly known as: CATAPRES   Take 0.2 mg by mouth 3 (three) times daily.      glipiZIDE 5 MG tablet   Commonly known as: GLUCOTROL   Take 2.5 mg by mouth daily.      hydrochlorothiazide 25 MG tablet   Commonly known as: HYDRODIURIL   Take 25 mg by mouth daily.      HYDROcodone-acetaminophen 5-500 MG per tablet   Commonly known as: VICODIN   Take 1 tablet by  mouth every 8 (eight) hours as needed. For pain      HYDROcodone-acetaminophen 10-325 MG per tablet   Commonly known as: NORCO   Take 1-2 tablets by mouth every 6 (six) hours as needed for pain.      HYDROcodone-acetaminophen 10-325 MG per tablet   Commonly known as: NORCO   Take 1 tablet by mouth every 6 (six) hours as needed for pain.           Follow-up Information    Follow up with [facility], MD in 3 weeks.   Contact information:   [address] [place] [address] [phone]          Signed: Emelia Loron 12/07/2011, 9:07 AM

## 2011-12-08 ENCOUNTER — Telehealth (INDEPENDENT_AMBULATORY_CARE_PROVIDER_SITE_OTHER): Payer: Self-pay

## 2011-12-08 NOTE — Telephone Encounter (Signed)
Tried to call the pt again today but the phone states she is unavailable.

## 2011-12-17 ENCOUNTER — Ambulatory Visit (INDEPENDENT_AMBULATORY_CARE_PROVIDER_SITE_OTHER): Payer: Medicaid Other | Admitting: General Surgery

## 2011-12-17 ENCOUNTER — Encounter (INDEPENDENT_AMBULATORY_CARE_PROVIDER_SITE_OTHER): Payer: Self-pay | Admitting: General Surgery

## 2011-12-17 VITALS — BP 120/72 | HR 70 | Temp 97.4°F | Resp 18 | Ht 64.0 in | Wt 241.2 lb

## 2011-12-17 DIAGNOSIS — Z09 Encounter for follow-up examination after completed treatment for conditions other than malignant neoplasm: Secondary | ICD-10-CM

## 2011-12-17 NOTE — Patient Instructions (Signed)

## 2011-12-17 NOTE — Progress Notes (Signed)
Subjective:     Patient ID: Robin Clayton, female   DOB: 1956/01/24, 56 y.o.   MRN: [id number]  HPI This is a 56 year old female who had a left breast mammographic abnormality with a core biopsy showing a complex sclerosing lesion. She underwent wire localized left breast biopsy. Her final pathology shows fibrocystic changes and 8 intraductal papilloma. She returns today doing well without any significant complaints.  Review of Systems     Objective:   Physical Exam Healing left breast incision without infection    Assessment:     Status post left breast biopsy    Plan:     She returns today doing very well. I released to full activity and asked her to come back and see me as needed. We discussed her pathology. She needs no further evaluation at this time. We discussed continuing her monthly self exams, yearly clinical exams, and the annular mammography. I asked her to call me back as needed.

## 2012-01-13 ENCOUNTER — Encounter (HOSPITAL_COMMUNITY): Payer: Self-pay | Admitting: Emergency Medicine

## 2012-01-13 ENCOUNTER — Emergency Department (HOSPITAL_COMMUNITY)
Admission: EM | Admit: 2012-01-13 | Discharge: 2012-01-13 | Disposition: A | Discharge status: Home / Self Care | Payer: Medicaid Other | Attending: Emergency Medicine | Admitting: Emergency Medicine

## 2012-01-13 DIAGNOSIS — N189 Chronic kidney disease, unspecified: Secondary | ICD-10-CM | POA: Insufficient documentation

## 2012-01-13 DIAGNOSIS — F17200 Nicotine dependence, unspecified, uncomplicated: Secondary | ICD-10-CM | POA: Insufficient documentation

## 2012-01-13 DIAGNOSIS — K219 Gastro-esophageal reflux disease without esophagitis: Secondary | ICD-10-CM | POA: Insufficient documentation

## 2012-01-13 DIAGNOSIS — E119 Type 2 diabetes mellitus without complications: Secondary | ICD-10-CM | POA: Insufficient documentation

## 2012-01-13 DIAGNOSIS — G4733 Obstructive sleep apnea (adult) (pediatric): Secondary | ICD-10-CM | POA: Insufficient documentation

## 2012-01-13 DIAGNOSIS — G8929 Other chronic pain: Secondary | ICD-10-CM | POA: Insufficient documentation

## 2012-01-13 DIAGNOSIS — J4489 Other specified chronic obstructive pulmonary disease: Secondary | ICD-10-CM | POA: Insufficient documentation

## 2012-01-13 DIAGNOSIS — Z760 Encounter for issue of repeat prescription: Secondary | ICD-10-CM

## 2012-01-13 DIAGNOSIS — J449 Chronic obstructive pulmonary disease, unspecified: Secondary | ICD-10-CM | POA: Insufficient documentation

## 2012-01-13 DIAGNOSIS — I129 Hypertensive chronic kidney disease with stage 1 through stage 4 chronic kidney disease, or unspecified chronic kidney disease: Secondary | ICD-10-CM | POA: Insufficient documentation

## 2012-01-13 MED ORDER — BUDESONIDE-FORMOTEROL FUMARATE 160-4.5 MCG/ACT IN AERO: 2.0000 | INHALATION_SPRAY | Freq: Every day | RESPIRATORY_TRACT | Status: DC

## 2012-01-13 MED ORDER — CLONIDINE HCL 0.2 MG PO TABS: 0.2000 mg | ORAL_TABLET | Freq: Three times a day (TID) | ORAL | Status: DC

## 2012-01-13 NOTE — ED Notes (Signed)
Pt from [facility], Symbicort inhaler ran out this morning and needs prescription refilled, denies shortness of breath, pain, other problems

## 2012-01-13 NOTE — ED Provider Notes (Signed)
History   This chart was scribed for Jones Skene, MD by Albertha Ghee Rifaie. This patient was seen in room TR11C/TR11C and the patient's care was started at 12:14PM.   CSN: [id number]  Arrival date & time 01/13/12  1128   First MD Initiated Contact with Patient 01/13/12 1214      No chief complaint on file.    The history is provided by the patient. No language interpreter was used.    Robin Clayton is a 56 y.o. female who presents to the Emergency Department asking for medicine refill of symbicort and clonidine. She states that her PCP went out of business and she is trying to follow up with the out-patient clinic but states that she can't schedule an appointment until later October, early November. Pt denies dizziness, HA, fever, chills, nausea. She has a h/o HTN, DM. GERD, and COPD. Pt denies smoking and alcohol use.  Past Medical History  Diagnosis Date  . Hypertension   . Obstructive sleep apnea     compliant with cpap  . COPD (chronic obstructive pulmonary disease)   . GERD (gastroesophageal reflux disease)     takes tums occ  . Constipation   . Chronic bronchitis   . Type II diabetes mellitus   . Chronic lower back pain   . Chronic renal insufficiency     Past Surgical History  Procedure Date  . Cesarean section 1970  . Breast biopsy 12/06/2011    Procedure: BREAST BIOPSY WITH NEEDLE LOCALIZATION;  Surgeon: Emelia Loron, MD;  Location: [facility] OR;  Service: General;  Laterality: Left;    Family History  Problem Relation Age of Onset  . Stomach cancer Mother 39  . Hypertension Mother   . Hyperlipidemia Mother   . Diabetes Mother   . Cancer Mother     stomach  . Colon cancer Neg Hx   . Hypertension Father   . Diabetes Father   . Heart attack Father   . Asthma Sister     History  Substance Use Topics  . Smoking status: Current Every Day Smoker -- 0.1 packs/day for 38 years    Types: Cigarettes  . Smokeless tobacco: Never Used  . Alcohol Use: No    No  OB history provided  ] Review of Systems  REVIEW OF SYSTEMS:   1.) CONSTITUTIONAL: No fever, chills or systemic signs of infection. No recent, unexplained weight changes.   2.) HEENT: No facial pain, sinus congestion or rhinorrhea is reported. Patient is denying any acute visual or hearing deficits. No sore throat or difficulty swallowing.  3.) PULMONARY: No cough sputum production or shortness of breath was reported.   4.) CARDIAC: No palpitations, chest pain or pressure.   5.) ABDOMINAL: Denies abdominal pain, nausea, vomiting or diarrhea. No Hematochezia or melena.  6.) SKIN: No rashes, itching   Allergies  Review of patient's allergies indicates no known allergies.  Home Medications   Current Outpatient Rx  Name Route Sig Dispense Refill  . ALBUTEROL SULFATE (2.5 MG/3ML) 0.083% IN NEBU Nebulization Take 2.5 mg by nebulization 3 (three) times daily as needed. For shortness of breath    . AMLODIPINE BESYLATE 10 MG PO TABS Oral Take 10 mg by mouth daily.    . BUDESONIDE-FORMOTEROL FUMARATE 160-4.5 MCG/ACT IN AERO Inhalation Inhale 2 puffs into the lungs 6 (six) times daily.     Marland Kitchen CLONIDINE HCL 0.2 MG PO TABS Oral Take 0.2 mg by mouth 3 (three) times daily.     Marland Kitchen  GLIPIZIDE 5 MG PO TABS Oral Take 2.5 mg by mouth daily.    Marland Kitchen HYDROCHLOROTHIAZIDE 25 MG PO TABS Oral Take 25 mg by mouth daily.    Marland Kitchen HYDROCODONE-ACETAMINOPHEN 10-325 MG PO TABS Oral Take 1 tablet by mouth every 6 (six) hours as needed. For pain    . HYDROCODONE-ACETAMINOPHEN 5-500 MG PO TABS Oral Take 1 tablet by mouth every 8 (eight) hours as needed. For pain      Triage Vitals: BP 121/72  Pulse 75  Temp 98.6 F (37 C) (Oral)  Resp 16  SpO2 94%  Physical Exam   Nursing notes reviewed.  Electronic medical record reviewed. VITAL SIGNS:   Filed Vitals:   01/13/12 1140  BP: 121/72  Pulse: 75  Temp: 98.6 F (37 C)  TempSrc: Oral  Resp: 16  SpO2: 94%   CONSTITUTIONAL: Awake, oriented, appears  non-toxic HENT: Atraumatic, normocephalic, oral mucosa pink and moist, airway patent. Nares patent without drainage. External ears normal. EYES: Conjunctiva clear, EOMI, PERRLA CARDIOVASCULAR: Normal heart rate, Normal rhythm, No murmurs, rubs, gallops PULMONARY/CHEST: Clear to auscultation, no rhonchi, wheezes, or rales. Symmetrical breath sounds. Non-tender. SKIN: Warm, Dry, No erythema, No rash  ED Course  Procedures (including critical care time)  DIAGNOSTIC STUDIES: Oxygen Saturation is 94% on room air, adequate by my interpretation.    COORDINATION OF CARE: 12:20PM- Discussed treatment plan which includes giving a list of references for prescription refill with pt at bedside and pt agreed to plan.   Labs Reviewed - No data to display No results found.   No diagnosis found.    MDM  Robin Clayton is a 56 y.o. female presents for medication refill - her PCP was previously at [facility].  Pt needs a refill on symbicort and also only has 1 refill of clonidine. Pt has no complaints today and appears well and in good spirits.  She is taking necessary action to find a new PCP.  Will refill Rx until she can find a new PCP   I personally performed the services described in this documentation, which was scribed in my presence. The recorded information has been reviewed and considered. Jones Skene, M.D.      Jones Skene, MD 01/17/12 1228

## 2012-01-13 NOTE — ED Notes (Signed)
Needs med refill symbicort has run out and she does not have appoiment till oct

## 2012-05-08 ENCOUNTER — Ambulatory Visit (INDEPENDENT_AMBULATORY_CARE_PROVIDER_SITE_OTHER): Payer: Medicaid Other | Admitting: Internal Medicine

## 2012-05-08 ENCOUNTER — Institutional Professional Consult (permissible substitution): Payer: Medicaid Other | Admitting: Emergency Medicine

## 2012-05-08 ENCOUNTER — Ambulatory Visit (INDEPENDENT_AMBULATORY_CARE_PROVIDER_SITE_OTHER)
Admission: RE | Admit: 2012-05-08 | Discharge: 2012-05-08 | Disposition: A | Discharge status: Home / Self Care | Payer: Medicaid Other | Source: Ambulatory Visit | Attending: Internal Medicine | Admitting: Internal Medicine

## 2012-05-08 ENCOUNTER — Encounter: Payer: Self-pay | Admitting: Internal Medicine

## 2012-05-08 VITALS — BP 120/74 | HR 84 | Temp 97.0°F | Ht 63.5 in | Wt 253.2 lb

## 2012-05-08 DIAGNOSIS — J449 Chronic obstructive pulmonary disease, unspecified: Secondary | ICD-10-CM

## 2012-05-08 DIAGNOSIS — F17200 Nicotine dependence, unspecified, uncomplicated: Secondary | ICD-10-CM

## 2012-05-08 MED ORDER — PANTOPRAZOLE SODIUM 40 MG PO TBEC: 40.0000 mg | DELAYED_RELEASE_TABLET | Freq: Every day | ORAL | Status: DC

## 2012-05-08 NOTE — Progress Notes (Signed)
  Subjective:    Patient ID: Robin Clayton, female    DOB: August 03, 1955  MRN: [id number]  HPI  36 yobf smoker no problems until around 2000 referred to pulmonary clinic 05/08/2012 by Dr Asencion Partridge with baseline wt 150 but freq steroids required for flares of ab  to wt 253 at first pulmonary clinic ov  05/08/2012 1st ov/ Corby Vandenberghe/ cc 10 years of intermittent cough and sob responsive to short courses of prednisone progressively worse indolent onset doe even in absence of cough to point where sob across the parking lot then developed cp comes goes x 2 months lasting up to a half a day unless takes vicodin. Location is ant, generalized, assoc with overt HB, non-radiating, no worse walking. Coughing does not make it worse.  No obvious daytime variabilty or assoc excess mucus production or chest tightness, subjective wheeze overt sinus or hb symptoms. No unusual exp hx or h/o childhood pna/ asthma or premature birth to her knowledge.   Sleeping ok without nocturnal  or early am exacerbation  of respiratory  c/o's or need for noct saba. Also denies any obvious fluctuation of symptoms with weather or environmental changes or other aggravating or alleviating factors except as outlined above.  Review of Systems  Constitutional: Negative for fever, chills and unexpected weight change.  HENT: Positive for dental problem. Negative for ear pain, nosebleeds, congestion, sore throat, rhinorrhea, sneezing, trouble swallowing, voice change, postnasal drip and sinus pressure.   Eyes: Negative for visual disturbance.  Respiratory: Positive for cough and shortness of breath. Negative for choking.   Cardiovascular: Positive for chest pain. Negative for leg swelling.  Gastrointestinal: Negative for vomiting, abdominal pain and diarrhea.  Genitourinary: Negative for difficulty urinating.  Musculoskeletal: Negative for arthralgias.  Skin: Negative for rash.  Neurological: Negative for tremors, syncope and headaches.    Hematological: Does not bruise/bleed easily.       Objective:   Physical Exam  amb obese bf nad Wt Readings from Last 3 Encounters:  05/08/12 253 lb 3.2 oz (114.851 kg)  12/17/11 241 lb 4 oz (109.43 kg)  12/06/11 247 lb 2.2 oz (112.1 kg)    HEENT mild turbinate edema.  Oropharynx no thrush or excess pnd or cobblestoning.  No JVD or cervical adenopathy. Mild accessory muscle hypertrophy. Trachea midline, nl thryroid. Chest was hyperinflated by percussion with diminished breath sounds and moderate increased exp time without wheeze. Hoover sign positive at mid inspiration. Regular rate and rhythm without murmur gallop or rub or increase P2 or edema.  Abd: no hsm, nl excursion. Ext warm without cyanosis or clubbing.     CXR  05/08/2012 : Chronic changes consistent with bronchitis and scarring. No active lung disease.         Assessment & Plan:

## 2012-05-08 NOTE — Patient Instructions (Addendum)
Continue symbicort 160 Take 2 puffs first thing in am and then another 2 puffs about 12 hours later.   Pantoprazole 40 mg Take 30-60 min before first meal of the day   GERD (REFLUX)  is an extremely common cause of respiratory symptoms, many times with no significant heartburn at all.    It can be treated with medication, but also with lifestyle changes including avoidance of late meals, excessive alcohol, smoking cessation, and avoid fatty foods, chocolate, peppermint, colas, red wine, and acidic juices such as orange juice.  NO MINT OR MENTHOL PRODUCTS SO NO COUGH DROPS  USE SUGARLESS CANDY INSTEAD (jolley ranchers or Stover's)  NO OIL BASED VITAMINS - use powdered substitutes.  Please remember to go to the  x-ray department downstairs for your tests - we will call you with the results when they are available.    Please schedule a follow up office visit in 4 weeks, sooner if needed with pft's on return.

## 2012-05-09 NOTE — Assessment & Plan Note (Signed)
Suspect this is actually relatively mild with the main problem being wt gain at least partly attributable to freq and difficult to control flares with AB requiring frequent steroids  DDX of  difficult airways managment all start with A and  include Adherence, Ace Inhibitors, Acid Reflux, Active Sinus Disease, Alpha 1 Antitripsin deficiency, Anxiety masquerading as Airways dz,  ABPA,  allergy(esp in young), Aspiration (esp in elderly), Adverse effects of DPI,  Active smokers, plus two Bs  = Bronchiectasis and Beta blocker use..and one C= CHF   Adherence is always the initial "prime suspect" and is a multilayered concern that requires a "trust but verify" approach in every patient - starting with knowing how to use medications, especially inhalers, correctly, keeping up with refills and understanding the fundamental difference between maintenance and prns vs those medications only taken for a very short course and then stopped and not refilled. The proper method of use, as well as anticipated side effects, of a metered-dose inhaler are discussed and demonstrated to the patient. Improved effectiveness after extensive coaching during this visit to a level of approximately  50% so will continue symbicort only for now  ? Acid reflux > reviewed rx and diet  Active smoking > discussed separately.

## 2012-05-09 NOTE — Progress Notes (Signed)
Quick Note:  Spoke with pt and notified of results per Dr. Wert. Pt verbalized understanding and denied any questions.  ______ 

## 2012-05-09 NOTE — Assessment & Plan Note (Signed)

## 2012-06-14 ENCOUNTER — Ambulatory Visit: Payer: Medicaid Other | Admitting: Internal Medicine

## 2012-06-18 ENCOUNTER — Emergency Department (HOSPITAL_COMMUNITY)
Admission: EM | Admit: 2012-06-18 | Discharge: 2012-06-18 | Disposition: A | Discharge status: Home / Self Care | Payer: Medicaid Other | Attending: Emergency Medicine | Admitting: Emergency Medicine

## 2012-06-18 ENCOUNTER — Encounter (HOSPITAL_COMMUNITY): Payer: Self-pay | Admitting: *Deleted

## 2012-06-18 DIAGNOSIS — I129 Hypertensive chronic kidney disease with stage 1 through stage 4 chronic kidney disease, or unspecified chronic kidney disease: Secondary | ICD-10-CM | POA: Insufficient documentation

## 2012-06-18 DIAGNOSIS — Z8739 Personal history of other diseases of the musculoskeletal system and connective tissue: Secondary | ICD-10-CM | POA: Insufficient documentation

## 2012-06-18 DIAGNOSIS — F17200 Nicotine dependence, unspecified, uncomplicated: Secondary | ICD-10-CM | POA: Insufficient documentation

## 2012-06-18 DIAGNOSIS — E119 Type 2 diabetes mellitus without complications: Secondary | ICD-10-CM | POA: Insufficient documentation

## 2012-06-18 DIAGNOSIS — G4733 Obstructive sleep apnea (adult) (pediatric): Secondary | ICD-10-CM | POA: Insufficient documentation

## 2012-06-18 DIAGNOSIS — J449 Chronic obstructive pulmonary disease, unspecified: Secondary | ICD-10-CM | POA: Insufficient documentation

## 2012-06-18 DIAGNOSIS — J4489 Other specified chronic obstructive pulmonary disease: Secondary | ICD-10-CM | POA: Insufficient documentation

## 2012-06-18 DIAGNOSIS — K5900 Constipation, unspecified: Secondary | ICD-10-CM | POA: Insufficient documentation

## 2012-06-18 DIAGNOSIS — N189 Chronic kidney disease, unspecified: Secondary | ICD-10-CM | POA: Insufficient documentation

## 2012-06-18 DIAGNOSIS — R197 Diarrhea, unspecified: Secondary | ICD-10-CM | POA: Insufficient documentation

## 2012-06-18 DIAGNOSIS — Z79899 Other long term (current) drug therapy: Secondary | ICD-10-CM | POA: Insufficient documentation

## 2012-06-18 DIAGNOSIS — K219 Gastro-esophageal reflux disease without esophagitis: Secondary | ICD-10-CM

## 2012-06-18 DIAGNOSIS — R0789 Other chest pain: Secondary | ICD-10-CM

## 2012-06-18 DIAGNOSIS — R002 Palpitations: Secondary | ICD-10-CM | POA: Insufficient documentation

## 2012-06-18 DIAGNOSIS — R0602 Shortness of breath: Secondary | ICD-10-CM | POA: Insufficient documentation

## 2012-06-18 DIAGNOSIS — Z9981 Dependence on supplemental oxygen: Secondary | ICD-10-CM | POA: Insufficient documentation

## 2012-06-18 LAB — COMPREHENSIVE METABOLIC PANEL
AST: 13 U/L (ref 0–37)
Albumin: 3.8 g/dL (ref 3.5–5.2)
Alkaline Phosphatase: 72 U/L (ref 39–117)
BUN: 17 mg/dL (ref 6–23)
Chloride: 100 mEq/L (ref 96–112)
Potassium: 3.8 mEq/L (ref 3.5–5.1)
Total Bilirubin: 0.2 mg/dL — ABNORMAL LOW (ref 0.3–1.2)

## 2012-06-18 LAB — CBC WITH DIFFERENTIAL/PLATELET
Basophils Absolute: 0 10*3/uL (ref 0.0–0.1)
Basophils Relative: 0 % (ref 0–1)
Eosinophils Absolute: 0.3 K/uL (ref 0.0–0.7)
Eosinophils Relative: 4 % (ref 0–5)
HCT: 40.3 % (ref 36.0–46.0)
Hemoglobin: 13.6 g/dL (ref 12.0–15.0)
Lymphocytes Relative: 38 % (ref 12–46)
Lymphs Abs: 2.7 K/uL (ref 0.7–4.0)
MCH: 28.3 pg (ref 26.0–34.0)
MCHC: 33.7 g/dL (ref 30.0–36.0)
MCV: 83.8 fL (ref 78.0–100.0)
Monocytes Absolute: 0.6 K/uL (ref 0.1–1.0)
Monocytes Relative: 8 % (ref 3–12)
Neutro Abs: 3.7 10*3/uL (ref 1.7–7.7)
Neutrophils Relative %: 51 % (ref 43–77)
Platelets: 198 K/uL (ref 150–400)
RBC: 4.81 MIL/uL (ref 3.87–5.11)
RDW: 15 % (ref 11.5–15.5)
WBC: 7.2 10*3/uL (ref 4.0–10.5)

## 2012-06-18 LAB — COMPREHENSIVE METABOLIC PANEL WITH GFR
ALT: 14 U/L (ref 0–35)
CO2: 23 meq/L (ref 19–32)
Calcium: 9.8 mg/dL (ref 8.4–10.5)
Creatinine, Ser: 1.22 mg/dL — ABNORMAL HIGH (ref 0.50–1.10)
GFR calc Af Amer: 57 mL/min — ABNORMAL LOW (ref >90)
GFR calc non Af Amer: 49 mL/min — ABNORMAL LOW (ref >90)
Glucose, Bld: 148 mg/dL — ABNORMAL HIGH (ref 70–99)
Sodium: 137 meq/L (ref 135–145)
Total Protein: 7.6 g/dL (ref 6.0–8.3)

## 2012-06-18 LAB — TROPONIN I: Troponin I: <0.3 ng/mL (ref <0.30)

## 2012-06-18 MED ORDER — PANTOPRAZOLE SODIUM 40 MG PO TBEC: 40.0000 mg | DELAYED_RELEASE_TABLET | Freq: Every day | ORAL | Status: DC

## 2012-06-18 MED ORDER — HYDROCODONE-ACETAMINOPHEN 5-325 MG PO TABS: ORAL_TABLET | ORAL | Status: DC

## 2012-06-18 MED ORDER — ALUM & MAG HYDROXIDE-SIMETH 200-200-20 MG/5ML PO SUSP
30.0000 mL | Freq: Once | ORAL | Status: AC
  Administered 2012-06-18: 30 mL via ORAL
  Filled 2012-06-18: qty 30

## 2012-06-18 MED ORDER — PANTOPRAZOLE SODIUM 40 MG IV SOLR
40.0000 mg | Freq: Once | INTRAVENOUS | Status: AC
  Administered 2012-06-18: 40 mg via INTRAVENOUS
  Filled 2012-06-18: qty 40

## 2012-06-18 NOTE — ED Notes (Signed)
Patient with chest pain, across the top of chest, describes it as a dull pain.  Patient states that her pain is intermittent, is worse with movement, especially under her left breast.  Patient denies any shortness of breath.

## 2012-06-18 NOTE — ED Notes (Signed)
The pt arrived by gems from home with lt sided chest pain since church earlier today.  When she arrived home her chest pain continued and she was worried that it did not go away with home 02.  324mg  aspirin enroute.  Iv per ems.  No pain at present exertional  Sob.  Alert oriented skin warm and dry.

## 2012-06-18 NOTE — ED Provider Notes (Signed)
History     CSN: [id number]  Arrival date & time 06/18/12  1952   First MD Initiated Contact with Patient 06/18/12 2053      Chief Complaint  Patient presents with  . Chest Pain    (Consider location/radiation/quality/duration/timing/severity/associated sxs/prior treatment) HPI Comments: Patient with no history of coronary disease, has history of COPD hypertension, diabetes, acid reflux reports that she's had this sharp severe squeezing left breast pain that seems very focal in location, nonradiating intermittently for a proximally 4 months. She reports she has been constipated recently and tells me a story that she went to church his fried chicken and had fried chicken with to help a new peppers for dinner last night. She reports afterward she had severe abdominal burning discomfort and had a hard firm stool followed by a more normal loose her stool that was nonbloody. She denies sweats. She reported she felt somewhat short of breath. She denies pleurisy. She denies any nasal congestion, sore throat, cough, fever or chills. She did not take any specific medication for the pain prior to arrival. She reports she did have a stress test just over a year ago and was reportedly negative. She's been seen by a cardiologist who has even done a Holter monitor test and told that that was okay. She endorses having had symptoms of palpitations in the past with no discrete explanation. She reports at the moment she is very minimal symptoms, not nearly as severe as it was earlier this evening when she was at church.  Patient is a 57 y.o. female presenting with chest pain. The history is provided by the patient and medical records.  Chest Pain Associated symptoms: palpitations and shortness of breath   Associated symptoms: no abdominal pain, no back pain, no cough, no diaphoresis, no fever, no nausea and not vomiting     Past Medical History  Diagnosis Date  . Hypertension   . Obstructive sleep apnea    compliant with cpap  . COPD (chronic obstructive pulmonary disease)   . GERD (gastroesophageal reflux disease)     takes tums occ  . Constipation   . Chronic bronchitis   . Type II diabetes mellitus   . Chronic lower back pain   . Chronic renal insufficiency     Past Surgical History  Procedure Laterality Date  . Cesarean section  1970  . Breast biopsy  12/06/2011    Procedure: BREAST BIOPSY WITH NEEDLE LOCALIZATION;  Surgeon: Emelia Loron, MD;  Location: [facility] OR;  Service: General;  Laterality: Left;    Family History  Problem Relation Age of Onset  . Stomach cancer Mother 48  . Hypertension Mother   . Hyperlipidemia Mother   . Diabetes Mother   . Cancer Mother     stomach  . Colon cancer Neg Hx   . Hypertension Father   . Diabetes Father   . Heart attack Father   . Asthma Sister     History  Substance Use Topics  . Smoking status: Current Every Day Smoker -- 0.50 packs/day for 38 years    Types: Cigarettes  . Smokeless tobacco: Never Used  . Alcohol Use: No    OB History   Grav Para Term Preterm Abortions TAB SAB Ect Mult Living                  Review of Systems  Constitutional: Negative for fever, chills and diaphoresis.  HENT: Negative for congestion and rhinorrhea.   Respiratory: Positive for shortness  of breath. Negative for cough.   Cardiovascular: Positive for chest pain and palpitations.  Gastrointestinal: Positive for diarrhea and constipation. Negative for nausea, vomiting and abdominal pain.  Genitourinary: Negative for flank pain.  Musculoskeletal: Negative for back pain.  All other systems reviewed and are negative.    Allergies  Review of patient's allergies indicates no known allergies.  Home Medications   Current Outpatient Rx  Name  Route  Sig  Dispense  Refill  . albuterol (PROVENTIL) (2.5 MG/3ML) 0.083% nebulizer solution   Nebulization   Take 2.5 mg by nebulization 3 (three) times daily as needed. For shortness of breath          . amLODipine (NORVASC) 10 MG tablet   Oral   Take 10 mg by mouth daily.         . budesonide-formoterol (SYMBICORT) 160-4.5 MCG/ACT inhaler   Inhalation   Inhale 2 puffs into the lungs 2 (two) times daily.         . cloNIDine (CATAPRES) 0.2 MG tablet   Oral   Take 1 tablet (0.2 mg total) by mouth 3 (three) times daily.   90 tablet   0   . glipiZIDE (GLUCOTROL) 5 MG tablet   Oral   Take 2.5 mg by mouth 2 (two) times daily before a meal.          . hydrochlorothiazide (HYDRODIURIL) 25 MG tablet   Oral   Take 25 mg by mouth daily.         Marland Kitchen HYDROcodone-acetaminophen (VICODIN) 5-500 MG per tablet   Oral   Take 1 tablet by mouth every 8 (eight) hours as needed. For pain         . pantoprazole (PROTONIX) 40 MG tablet   Oral   Take 1 tablet (40 mg total) by mouth daily. Take 30-60 min before first meal of the day   30 tablet   2   . HYDROcodone-acetaminophen (NORCO/VICODIN) 5-325 MG per tablet      1-2 tablets po q 6 hours prn moderate to severe pain   15 tablet   0   . pantoprazole (PROTONIX) 40 MG tablet   Oral   Take 1 tablet (40 mg total) by mouth daily.   14 tablet   0     BP 126/66  Pulse 75  Temp(Src) 98 F (36.7 C) (Oral)  Resp 20  SpO2 95%  Physical Exam  Nursing note and vitals reviewed. Constitutional: She appears well-developed and well-nourished.  HENT:  Head: Normocephalic and atraumatic.  Eyes: Pupils are equal, round, and reactive to light.  Neck: Normal range of motion. Neck supple.  Cardiovascular: Normal rate and regular rhythm.   No murmur heard. Pulses:      Radial pulses are 2+ on the right side, and 2+ on the left side.  Pulmonary/Chest: Effort normal. No respiratory distress. She has no wheezes. She has no rales.  Abdominal: Soft. Bowel sounds are normal. She exhibits no distension. There is no tenderness.  Morbidly obese, no rebound or guarding on examination.  Neurological: She is alert. Coordination normal.  Skin:  Skin is warm and dry. No rash noted.    ED Course  Procedures (including critical care time)  Labs Reviewed  COMPREHENSIVE METABOLIC PANEL - Abnormal; Notable for the following:    Glucose, Bld 148 (*)    Creatinine, Ser 1.22 (*)    Total Bilirubin 0.2 (*)    GFR calc non Af Amer 49 (*)    GFR  calc Af Amer 57 (*)    All other components within normal limits  CBC WITH DIFFERENTIAL  TROPONIN I   No results found.   1. GERD (gastroesophageal reflux disease)   2. Atypical chest pain     Room air saturation is 98% and I interpret this to be normal. EKG performed at time 19:58 shows sinus rhythm at a rate of 73. Poor R wave progression is noted between leads V1 to V3. Nonspecific flattened T waves are noted inferiorly. ECG is not significantly changed compared to that from 11/05/2010.  MDM   Patient with somewhat atypical chest pain given it was very sharp and no focal area underneath her left breast. In addition she has had similar symptoms intermittently, multiple times a day for the last 4 months. I have no clinical suspicion that this is cardiac angina. Her ECG is essentially unchanged from prior. Despite risk factors, given her atypical symptoms, I feel confident she will be able to be discharged home to followup with her primary care physician. Maalox and Protonix were given here in the emergency department with improvement of her symptoms.        Gavin Pound. Oletta Lamas, MD 06/18/12 1610

## 2012-06-18 NOTE — Discharge Instructions (Signed)
Gastroesophageal Reflux Disease, Adult Gastroesophageal reflux disease (GERD) happens when acid from your stomach flows up into the esophagus. When acid comes in contact with the esophagus, the acid causes soreness (inflammation) in the esophagus. Over time, GERD may create small holes (ulcers) in the lining of the esophagus. CAUSES   Increased body weight. This puts pressure on the stomach, making acid rise from the stomach into the esophagus.  Smoking. This increases acid production in the stomach.  Drinking alcohol. This causes decreased pressure in the lower esophageal sphincter (valve or ring of muscle between the esophagus and stomach), allowing acid from the stomach into the esophagus.  Late evening meals and a full stomach. This increases pressure and acid production in the stomach.  A malformed lower esophageal sphincter. Sometimes, no cause is found. SYMPTOMS   Burning pain in the lower part of the mid-chest behind the breastbone and in the mid-stomach area. This may occur twice a week or more often.  Trouble swallowing.  Sore throat.  Dry cough.  Asthma-like symptoms including chest tightness, shortness of breath, or wheezing. DIAGNOSIS  Your caregiver may be able to diagnose GERD based on your symptoms. In some cases, X-rays and other tests may be done to check for complications or to check the condition of your stomach and esophagus. TREATMENT  Your caregiver may recommend over-the-counter or prescription medicines to help decrease acid production. Ask your caregiver before starting or adding any new medicines.  HOME CARE INSTRUCTIONS   Change the factors that you can control. Ask your caregiver for guidance concerning weight loss, quitting smoking, and alcohol consumption.  Avoid foods and drinks that make your symptoms worse, such as:  Caffeine or alcoholic drinks.  Chocolate.  Peppermint or mint flavorings.  Garlic and onions.  Spicy foods.  Citrus fruits,  such as oranges, lemons, or limes.  Tomato-based foods such as sauce, chili, salsa, and pizza.  Fried and fatty foods.  Avoid lying down for the 3 hours prior to your bedtime or prior to taking a nap.  Eat small, frequent meals instead of large meals.  Wear loose-fitting clothing. Do not wear anything tight around your waist that causes pressure on your stomach.  Raise the head of your bed 6 to 8 inches with wood blocks to help you sleep. Extra pillows will not help.  Only take over-the-counter or prescription medicines for pain, discomfort, or fever as directed by your caregiver.  Do not take aspirin, ibuprofen, or other nonsteroidal anti-inflammatory drugs (NSAIDs). SEEK IMMEDIATE MEDICAL CARE IF:   You have pain in your arms, neck, jaw, teeth, or back.  Your pain increases or changes in intensity or duration.  You develop nausea, vomiting, or sweating (diaphoresis).  You develop shortness of breath, or you faint.  Your vomit is green, yellow, black, or looks like coffee grounds or blood.  Your stool is red, bloody, or black. These symptoms could be signs of other problems, such as heart disease, gastric bleeding, or esophageal bleeding. MAKE SURE YOU:   Understand these instructions.  Will watch your condition.  Will get help right away if you are not doing well or get worse. Document Released: 01/27/2005 Document Revised: 07/12/2011 Document Reviewed: 11/06/2010 [employer] Patient Information 2013 [employer].  

## 2012-07-05 ENCOUNTER — Other Ambulatory Visit: Payer: Self-pay | Admitting: Family Medicine

## 2012-07-05 DIAGNOSIS — Z1231 Encounter for screening mammogram for malignant neoplasm of breast: Secondary | ICD-10-CM

## 2012-08-09 ENCOUNTER — Ambulatory Visit
Admission: RE | Admit: 2012-08-09 | Discharge: 2012-08-09 | Disposition: A | Discharge status: Home / Self Care | Payer: Medicaid Other | Source: Ambulatory Visit | Attending: Family Medicine | Admitting: Family Medicine

## 2012-08-09 DIAGNOSIS — Z1231 Encounter for screening mammogram for malignant neoplasm of breast: Secondary | ICD-10-CM

## 2012-08-10 ENCOUNTER — Other Ambulatory Visit: Payer: Self-pay | Admitting: Family Medicine

## 2012-08-10 DIAGNOSIS — R928 Other abnormal and inconclusive findings on diagnostic imaging of breast: Secondary | ICD-10-CM

## 2012-08-23 ENCOUNTER — Ambulatory Visit
Admission: RE | Admit: 2012-08-23 | Discharge: 2012-08-23 | Disposition: A | Discharge status: Home / Self Care | Payer: Medicaid Other | Source: Ambulatory Visit | Attending: Family Medicine | Admitting: Family Medicine

## 2012-08-23 DIAGNOSIS — R928 Other abnormal and inconclusive findings on diagnostic imaging of breast: Secondary | ICD-10-CM

## 2012-09-14 ENCOUNTER — Other Ambulatory Visit: Payer: Self-pay | Admitting: Internal Medicine

## 2012-09-15 ENCOUNTER — Ambulatory Visit (INDEPENDENT_AMBULATORY_CARE_PROVIDER_SITE_OTHER): Payer: Medicaid Other | Admitting: Internal Medicine

## 2012-09-15 ENCOUNTER — Encounter: Payer: Self-pay | Admitting: Internal Medicine

## 2012-09-15 VITALS — BP 118/72 | HR 92 | Temp 98.5°F | Ht 62.25 in | Wt 245.0 lb

## 2012-09-15 DIAGNOSIS — I10 Essential (primary) hypertension: Secondary | ICD-10-CM

## 2012-09-15 DIAGNOSIS — J449 Chronic obstructive pulmonary disease, unspecified: Secondary | ICD-10-CM

## 2012-09-15 MED ORDER — TELMISARTAN 40 MG PO TABS: 40.0000 mg | ORAL_TABLET | Freq: Every day | ORAL | Status: DC

## 2012-09-15 MED ORDER — PREDNISONE (PAK) 10 MG PO TABS: ORAL_TABLET | ORAL | Status: DC

## 2012-09-15 MED ORDER — ACLIDINIUM BROMIDE 400 MCG/ACT IN AEPB: 1.0000 | INHALATION_SPRAY | Freq: Two times a day (BID) | RESPIRATORY_TRACT | Status: DC

## 2012-09-15 NOTE — Progress Notes (Signed)
Subjective:    Patient ID: Robin Clayton, female    DOB: 04/25/1956  MRN: [id number]   Brief patient profile:  62 yobf smoker no problems until around 2000 referred to pulmonary clinic 05/08/2012 by Dr Asencion Partridge with baseline wt 150 but freq steroids required for flares of ab  to wt 253 at [facility] pulmonary clinic ov  05/08/2012 1st ov/ Efrat Zuidema/ cc 10 years of intermittent cough and sob responsive to short courses of prednisone progressively worse indolent onset doe even in absence of cough to point where sob across the parking lot then developed cp comes goes x 2 months lasting up to a half a day unless takes vicodin. Location is ant, generalized, assoc with overt HB, non-radiating, no worse walking. Coughing does not make it worse. rec Continue symbicort 160 Take 2 puffs first thing in am and then another 2 puffs about 12 hours later.  Pantoprazole 40 mg Take 30-60 min before first meal of the day  GERD  diet Please schedule a follow up office visit in 4 weeks, sooner if needed with pft's on return > did not return  09/15/2012 f/u ov/Tristian Bouska copd/ 02 dep at baseline/ now on ACEi but quit smoking effective 08/31/12 Chief Complaint  Patient presents with  . Follow-up    Increased DOE x 2 wks, and wheezing for the past 3 days.   sob does improve with neb for an hour or two or rest  But aslo  wakes at night with hoarsness and dry cough day > niight Indolent onset, progressively worse.   No obvious daytime variabilty or assoc  cp or chest tightness, subjective wheeze overt sinus or hb symptoms. No unusual exp hx or h/o childhood pna/ asthma or premature birth to his knowledge.   Sleeping ok without nocturnal  or early am exacerbation  of respiratory  c/o's or need for noct saba. Also denies any obvious fluctuation of symptoms with weather or environmental changes or other aggravating or alleviating factors except as outlined above   Current Medications, Allergies, Past Medical History, Past Surgical  History, Family History, and Social History were reviewed in [employer] record.  ROS  The following are not active complaints unless bolded sore throat, dysphagia, dental problems, itching, sneezing,  nasal congestion or excess/ purulent secretions, ear ache,   fever, chills, sweats, unintended wt loss, pleuritic or exertional cp, hemoptysis,  orthopnea pnd or leg swelling, presyncope, palpitations, heartburn, abdominal pain, anorexia, nausea, vomiting, diarrhea  or change in bowel or urinary habits, change in stools or urine, dysuria,hematuria,  rash, arthralgias, visual complaints, headache, numbness weakness or ataxia or problems with walking or coordination,  change in mood/affect or memory.            Objective:   Physical Exam  amb obese bf nad Wt Readings from Last 3 Encounters:  09/15/12 245 lb (111.131 kg)  05/08/12 253 lb 3.2 oz (114.851 kg)  12/17/11 241 lb 4 oz (109.43 kg)      HEENT mild turbinate edema.  Oropharynx no thrush or excess pnd or cobblestoning.  No JVD or cervical adenopathy. Mild accessory muscle hypertrophy. Trachea midline, nl thryroid. Chest was hyperinflated by percussion with diminished breath sounds and moderate increased exp time without wheeze. Hoover sign positive at mid inspiration. Regular rate and rhythm without murmur gallop or rub or increase P2 or edema.  Abd: no hsm, nl excursion. Ext warm without cyanosis or clubbing.     CXR  05/08/2012 : Chronic changes consistent  with bronchitis and scarring. No active lung disease.         Assessment & Plan:

## 2012-09-15 NOTE — Patient Instructions (Addendum)
Pepcid 20 ac one at bedtime  Stop lisinopril and start micardis 40 mg one daily   Plan A = automatic = symbicort 2 puff followed by one of tudorza twice daily until return  Plan B = Backup = proaire up to every 4 hours only   Plan C = Nebulizer every 4 hours  Prednisone 10 mg take  4 each am x 2 days,   2 each am x 2 days,  1 each am x2days and stop   Work on inhaler technique:  relax and gently blow all the way out then take a nice smooth deep breath back in, triggering the inhaler at same time you start breathing in.  Hold for up to 5 seconds if you can.  Rinse and gargle with water when done   If your mouth or throat starts to bother you,   I suggest you time the inhaler to your dental care and after using the inhaler(s) brush teeth and tongue with a baking soda containing toothpaste and when you rinse this out, gargle with it first to see if this helps your mouth and throat.     Please schedule a follow up office visit in 4 weeks, sooner if needed with pft's on return

## 2012-09-16 NOTE — Assessment & Plan Note (Addendum)
Much worse airway control despite smoking cessation Aug 31 2012 DDX of  difficult airways managment all start with A and  include Adherence, Ace Inhibitors, Acid Reflux, Active Sinus Disease, Alpha 1 Antitripsin deficiency, Anxiety masquerading as Airways dz,  ABPA,  allergy(esp in young), Aspiration (esp in elderly), Adverse effects of DPI,  Active smokers, plus two Bs  = Bronchiectasis and Beta blocker use..and one C= CHF  Adherence is always the initial "prime suspect" and is a multilayered concern that requires a "trust but verify" approach in every patient - starting with knowing how to use medications, especially inhalers, correctly, keeping up with refills and understanding the fundamental difference between maintenance and prns vs those medications only taken for a very short course and then stopped and not refilled. The proper method of use, as well as anticipated side effects, of a metered-dose inhaler are discussed and demonstrated to the patient. Improved effectiveness after extensive coaching during this visit to a level of approximately  75% so continue symbicort and tudorza and return for pft's in one month off acei  Acei effects > try off x one month (see hbp)  ? Active smokiing > denies effective 08/31/12 which makes me even more suspicious of an acei effect here, because her copd should be better, not worse, if in fact she quit  ? Acid reflux > continue rx plus add pepcid 20 mg at bedtime for noct "wheeze" and saba dep   Each maintenance medication was reviewed in detail including most importantly the difference between maintenance and as needed and under what circumstances the prns are to be used.  Please see instructions for details which were reviewed in writing and the patient given a copy.

## 2012-09-16 NOTE — Assessment & Plan Note (Addendum)
ACE inhibitors are problematic in  pts with airway complaints because  even experienced pulmonologists can't always distinguish ace effects from copd/asthma.  By themselves they don't actually cause a problem, much like oxygen can't by itself start a fire, but they certainly serve as a powerful catalyst or enhancer for any "fire"  or inflammatory process in the upper airway, be it caused by an ET  tube or more commonly reflux (especially in the obese or pts with known GERD or who are on biphoshonates).    In the era of ARB near equivalency (at least in short run)  until we have a better handle on the reversibility of the airway problem, it just makes sense to avoid ACEI  entirely in the short run and then decide later, having established a level of airway control using a reasonable limited regimen, whether to add back ace but even then being very careful to observe the pt for worsening airway control and number of meds used/ needed to control symptoms.    rec start micardis 40 mg one daily x 4 weeks of samples

## 2012-10-02 ENCOUNTER — Telehealth: Payer: Self-pay | Admitting: Internal Medicine

## 2012-10-02 MED ORDER — TELMISARTAN 40 MG PO TABS: 40.0000 mg | ORAL_TABLET | Freq: Every day | ORAL | Status: DC

## 2012-10-02 NOTE — Telephone Encounter (Signed)
lmomtcb x1 for pt rx sent 

## 2012-10-02 NOTE — Telephone Encounter (Signed)
Spoke with patient, informed her Rx has been sent Nothing further needed at this time

## 2012-10-03 ENCOUNTER — Telehealth: Payer: Self-pay | Admitting: Internal Medicine

## 2012-10-03 ENCOUNTER — Emergency Department (HOSPITAL_COMMUNITY): Payer: Medicaid Other

## 2012-10-03 ENCOUNTER — Encounter (HOSPITAL_COMMUNITY): Payer: Self-pay | Admitting: Emergency Medicine

## 2012-10-03 ENCOUNTER — Emergency Department (HOSPITAL_COMMUNITY)
Admission: EM | Admit: 2012-10-03 | Discharge: 2012-10-04 | Disposition: A | Discharge status: Home / Self Care | Payer: Medicaid Other | Attending: Emergency Medicine | Admitting: Emergency Medicine

## 2012-10-03 DIAGNOSIS — R197 Diarrhea, unspecified: Secondary | ICD-10-CM | POA: Insufficient documentation

## 2012-10-03 DIAGNOSIS — G8929 Other chronic pain: Secondary | ICD-10-CM | POA: Insufficient documentation

## 2012-10-03 DIAGNOSIS — N189 Chronic kidney disease, unspecified: Secondary | ICD-10-CM | POA: Insufficient documentation

## 2012-10-03 DIAGNOSIS — Z87891 Personal history of nicotine dependence: Secondary | ICD-10-CM | POA: Insufficient documentation

## 2012-10-03 DIAGNOSIS — J441 Chronic obstructive pulmonary disease with (acute) exacerbation: Secondary | ICD-10-CM | POA: Insufficient documentation

## 2012-10-03 DIAGNOSIS — E119 Type 2 diabetes mellitus without complications: Secondary | ICD-10-CM | POA: Insufficient documentation

## 2012-10-03 DIAGNOSIS — Z79899 Other long term (current) drug therapy: Secondary | ICD-10-CM | POA: Insufficient documentation

## 2012-10-03 DIAGNOSIS — Z8719 Personal history of other diseases of the digestive system: Secondary | ICD-10-CM | POA: Insufficient documentation

## 2012-10-03 DIAGNOSIS — I10 Essential (primary) hypertension: Secondary | ICD-10-CM

## 2012-10-03 DIAGNOSIS — I129 Hypertensive chronic kidney disease with stage 1 through stage 4 chronic kidney disease, or unspecified chronic kidney disease: Secondary | ICD-10-CM | POA: Insufficient documentation

## 2012-10-03 DIAGNOSIS — K219 Gastro-esophageal reflux disease without esophagitis: Secondary | ICD-10-CM | POA: Insufficient documentation

## 2012-10-03 DIAGNOSIS — G4733 Obstructive sleep apnea (adult) (pediatric): Secondary | ICD-10-CM | POA: Insufficient documentation

## 2012-10-03 DIAGNOSIS — R609 Edema, unspecified: Secondary | ICD-10-CM | POA: Insufficient documentation

## 2012-10-03 LAB — COMPREHENSIVE METABOLIC PANEL
ALT: 19 U/L (ref 0–35)
AST: 17 U/L (ref 0–37)
Albumin: 4.1 g/dL (ref 3.5–5.2)
Alkaline Phosphatase: 85 U/L (ref 39–117)
BUN: 16 mg/dL (ref 6–23)
CO2: 26 mEq/L (ref 19–32)
Calcium: 10 mg/dL (ref 8.4–10.5)
Chloride: 102 mEq/L (ref 96–112)
Creatinine, Ser: 1.39 mg/dL — ABNORMAL HIGH (ref 0.50–1.10)
GFR calc Af Amer: 48 mL/min — ABNORMAL LOW (ref >90)
GFR calc non Af Amer: 42 mL/min — ABNORMAL LOW (ref >90)
Glucose, Bld: 147 mg/dL — ABNORMAL HIGH (ref 70–99)
Potassium: 4 mEq/L (ref 3.5–5.1)
Sodium: 139 mEq/L (ref 135–145)
Total Bilirubin: 0.2 mg/dL — ABNORMAL LOW (ref 0.3–1.2)
Total Protein: 8.1 g/dL (ref 6.0–8.3)

## 2012-10-03 LAB — CBC WITH DIFFERENTIAL/PLATELET
Basophils Absolute: 0 10*3/uL (ref 0.0–0.1)
Basophils Relative: 0 % (ref 0–1)
Eosinophils Absolute: 0.3 10*3/uL (ref 0.0–0.7)
Eosinophils Relative: 3 % (ref 0–5)
HCT: 39.1 % (ref 36.0–46.0)
Hemoglobin: 12.7 g/dL (ref 12.0–15.0)
Lymphocytes Relative: 35 % (ref 12–46)
Lymphs Abs: 3.1 10*3/uL (ref 0.7–4.0)
MCH: 26.8 pg (ref 26.0–34.0)
MCHC: 32.5 g/dL (ref 30.0–36.0)
MCV: 82.7 fL (ref 78.0–100.0)
Monocytes Absolute: 0.7 10*3/uL (ref 0.1–1.0)
Monocytes Relative: 8 % (ref 3–12)
Neutro Abs: 4.9 10*3/uL (ref 1.7–7.7)
Neutrophils Relative %: 54 % (ref 43–77)
Platelets: 202 10*3/uL (ref 150–400)
RBC: 4.73 MIL/uL (ref 3.87–5.11)
RDW: 15.8 % — ABNORMAL HIGH (ref 11.5–15.5)
WBC: 9 10*3/uL (ref 4.0–10.5)

## 2012-10-03 LAB — PRO B NATRIURETIC PEPTIDE: Pro B Natriuretic peptide (BNP): 35.9 pg/mL (ref 0–125)

## 2012-10-03 LAB — GLUCOSE, CAPILLARY: Glucose-Capillary: 155 mg/dL — ABNORMAL HIGH (ref 70–99)

## 2012-10-03 MED ORDER — TELMISARTAN 40 MG PO TABS: 40.0000 mg | ORAL_TABLET | Freq: Every day | ORAL | Status: DC

## 2012-10-03 NOTE — ED Notes (Signed)
Pt states that she has had feet swelling x 2 wks and is hypertensive. Recently changed HCTZ to mycardis. HA.

## 2012-10-03 NOTE — ED Notes (Signed)
[id number]<[id number]> Expected date:<BR> Expected time:<BR> Means of arrival:<BR> Comments:<BR> EMS/weakness

## 2012-10-03 NOTE — Telephone Encounter (Signed)
Spoke with pt Will give samples to last until ov  Will discuss alternative then

## 2012-10-04 MED ORDER — FUROSEMIDE 20 MG PO TABS
20.0000 mg | ORAL_TABLET | Freq: Once | ORAL | Status: AC
  Administered 2012-10-04: 20 mg via ORAL
  Filled 2012-10-04: qty 1

## 2012-10-04 MED ORDER — FUROSEMIDE 20 MG PO TABS: 20.0000 mg | ORAL_TABLET | Freq: Every day | ORAL | Status: DC

## 2012-10-04 NOTE — ED Provider Notes (Signed)
History     CSN: [id number]  Arrival date & time 10/03/12  2152   First MD Initiated Contact with Patient 10/04/12 0006      Chief Complaint  Patient presents with  . 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  She also states, that since, she switched to the Micardis for blood pressure has been elevated, whereas with the hydrochlorothiazide.  Her blood sugar was always low.  This was switched because her physician told her she was protecting her kidneys.  She does report some shortness of breath with exertion.  He is still able to lay flat in bed at night and slipped while the edema is mostly resolved by morning, but by the day has returned  The history is provided by the patient.    Past Medical History  Diagnosis Date  . Hypertension   . Obstructive sleep apnea     compliant with cpap  . COPD (chronic obstructive pulmonary disease)   . GERD (gastroesophageal reflux disease)     takes tums occ  . Constipation   . Chronic bronchitis   . Type II diabetes mellitus   . Chronic lower back pain   . Chronic renal insufficiency     Past Surgical History  Procedure Laterality Date  . Cesarean section  1970  . Breast biopsy  12/06/2011    Procedure: BREAST BIOPSY WITH NEEDLE LOCALIZATION;  Surgeon: Emelia Loron, MD;  Location: [facility] OR;  Service: General;  Laterality: Left;    Family History  Problem Relation Age of Onset  . Stomach cancer Mother 63  . Hypertension Mother   . Hyperlipidemia Mother   . Diabetes Mother   . Cancer Mother     stomach  . Colon cancer Neg Hx   . Hypertension Father   . Diabetes Father   . Heart attack Father   . Asthma Sister     History  Substance Use Topics  . Smoking status: Former Smoker -- 0.50 packs/day for 38 years    Types:  Cigarettes    Quit date: 09/01/2012  . Smokeless tobacco: Never Used  . Alcohol Use: No    OB History   Grav Para Term Preterm Abortions TAB SAB Ect Mult Living                  Review of Systems  Constitutional: Negative for fever and chills.  HENT: Negative for congestion.   Respiratory: Positive for shortness of breath. Negative for cough.   Cardiovascular: Positive for leg swelling. Negative for chest pain.  Gastrointestinal: Negative for nausea and diarrhea.  Genitourinary: Negative for dysuria and frequency.  Musculoskeletal: Negative for joint swelling.  Skin: Negative for rash and wound.  All other systems reviewed and are negative.    Allergies  Review of patient's allergies indicates no known allergies.  Home Medications   Current Outpatient Rx  Name  Route  Sig  Dispense  Refill  . Aclidinium Bromide (TUDORZA PRESSAIR) 400 MCG/ACT AEPB   Inhalation   Inhale 1 puff into the lungs 2 (two) times daily. One twice daily   1 each   11   . albuterol (PROAIR HFA) 108 (90 BASE) MCG/ACT inhaler   Inhalation   Inhale 2 puffs into the lungs every 6 (six) hours as needed for wheezing.         Marland Kitchen albuterol (PROVENTIL) (2.5 MG/3ML)  0.083% nebulizer solution   Nebulization   Take 2.5 mg by nebulization 3 (three) times daily as needed. For shortness of breath         . amLODipine (NORVASC) 10 MG tablet   Oral   Take 10 mg by mouth daily.         Marland Kitchen atorvastatin (LIPITOR) 10 MG tablet   Oral   Take 10 mg by mouth every evening.          . budesonide-formoterol (SYMBICORT) 160-4.5 MCG/ACT inhaler   Inhalation   Inhale 2 puffs into the lungs 2 (two) times daily.         . cloNIDine (CATAPRES) 0.2 MG tablet   Oral   Take 1 tablet (0.2 mg total) by mouth 3 (three) times daily.   90 tablet   0   . glipiZIDE (GLUCOTROL) 5 MG tablet   Oral   Take 2.5 mg by mouth 2 (two) times daily before a meal.          . HYDROcodone-acetaminophen (NORCO/VICODIN)  5-325 MG per tablet      1-2 tablets po q 6 hours prn moderate to severe pain   15 tablet   0   . pantoprazole (PROTONIX) 40 MG tablet      take 1 tablet by mouth once daily TAKE 30 TO 60 MINUTES BEFORE THE FIRST MEAL OF THE DAY   30 tablet   0     Keep appt for refills thanks   . telmisartan (MICARDIS) 40 MG tablet   Oral   Take 1 tablet (40 mg total) by mouth daily.   14 tablet   0   . furosemide (LASIX) 20 MG tablet   Oral   Take 1 tablet (20 mg total) by mouth daily.   3 tablet   0     BP 127/77  Pulse 72  Temp(Src) 98.4 F (36.9 C) (Oral)  Resp 19  SpO2 96%  Physical Exam  Nursing note and vitals reviewed. Constitutional: She is oriented to person, place, and time. She appears well-developed and well-nourished.  HENT:  Head: Normocephalic and atraumatic.  Eyes: Pupils are equal, round, and reactive to light.  Neck: Normal range of motion.  Cardiovascular: Normal rate and regular rhythm.   Pulmonary/Chest: Effort normal. No respiratory distress. She has no wheezes.  Musculoskeletal: She exhibits edema. She exhibits no tenderness.  Neurological: She is alert and oriented to person, place, and time.  Skin: Skin is warm and dry. No erythema.    ED Course  Procedures (including critical care time)  Labs Reviewed  CBC WITH DIFFERENTIAL - Abnormal; Notable for the following:    RDW 15.8 (*)    All other components within normal limits  COMPREHENSIVE METABOLIC PANEL - Abnormal; Notable for the following:    Glucose, Bld 147 (*)    Creatinine, Ser 1.39 (*)    Total Bilirubin 0.2 (*)    GFR calc non Af Amer 42 (*)    GFR calc Af Amer 48 (*)    All other components within normal limits  GLUCOSE, CAPILLARY - Abnormal; Notable for the following:    Glucose-Capillary 155 (*)    All other components within normal limits  PRO B NATRIURETIC PEPTIDE   Dg Chest 2 View  10/03/2012   *RADIOLOGY REPORT*  Clinical Data: Hypertension.  Shortness of breath.  CHEST - 2  VIEW  Comparison: Chest x-ray 05/18/2012.  Findings: Mild diffuse interstitial prominence and peribronchial cuffing.  No acute  consolidative airspace disease.  No pleural effusions.  No evidence of pulmonary edema.  Heart size and mediastinal contours are within normal limits.  IMPRESSION: 1.  Mild diffuse bronchial wall thickening and interstitial prominence.  Findings may suggest a mild bronchitis.   Original Report Authenticated By: Trudie Reed, M.D.     1. Edema   2. Hypertension       MDM   Patient's labs were reviewed.  Chest x-ray, reviewed all within normal limits.  Note indication for CHF.  She was given 20 mg Lasix tablet.  She has diarrhea.  Reduced in the emergency department.  Her blood pressure has significantly decreased to within normal range.  She has been instructed to follow up with her primary care physician by phone tomorrow and discuss alternative treatment.  Since the Micardis is not controlling her blood pressure and allowing peripheral edema to occur        Arman Filter, NP 10/04/12 0357  Arman Filter, NP 10/04/12 [phone]

## 2012-10-04 NOTE — ED Notes (Signed)
Pt alert x4 v/s stable d/c instruction given teach back done. Will monitor.

## 2012-10-05 NOTE — ED Provider Notes (Signed)
Medical screening examination/treatment/procedure(s) were performed by non-physician practitioner and as supervising physician I was immediately available for consultation/collaboration.  Raena Pau, MD 10/05/12 0532 

## 2012-10-08 ENCOUNTER — Other Ambulatory Visit: Payer: Self-pay | Admitting: Internal Medicine

## 2012-11-01 ENCOUNTER — Ambulatory Visit: Payer: Medicaid Other | Admitting: Internal Medicine

## 2012-11-09 ENCOUNTER — Other Ambulatory Visit: Payer: Self-pay | Admitting: Internal Medicine

## 2012-12-05 ENCOUNTER — Encounter: Payer: Self-pay | Admitting: Internal Medicine

## 2012-12-05 ENCOUNTER — Ambulatory Visit (INDEPENDENT_AMBULATORY_CARE_PROVIDER_SITE_OTHER): Payer: Medicaid Other | Admitting: Internal Medicine

## 2012-12-05 VITALS — BP 128/88 | HR 81 | Temp 97.8°F | Ht 63.0 in | Wt 223.0 lb

## 2012-12-05 DIAGNOSIS — I10 Essential (primary) hypertension: Secondary | ICD-10-CM

## 2012-12-05 DIAGNOSIS — J449 Chronic obstructive pulmonary disease, unspecified: Secondary | ICD-10-CM

## 2012-12-05 DIAGNOSIS — F17200 Nicotine dependence, unspecified, uncomplicated: Secondary | ICD-10-CM

## 2012-12-05 LAB — PULMONARY FUNCTION TEST

## 2012-12-05 MED ORDER — TELMISARTAN 40 MG PO TABS: 40.0000 mg | ORAL_TABLET | Freq: Every day | ORAL | Status: DC

## 2012-12-05 NOTE — Progress Notes (Signed)
Subjective:    Patient ID: Robin Clayton, female    DOB: 05-06-1955  MRN: [id number]   Brief patient profile:  53 yobf smoker no problems until around 2000 referred to pulmonary clinic 05/08/2012 by Dr Asencion Partridge with baseline wt 150 but freq steroids required for flares of ab  to wt 253 at first pulmonary clinic ov  05/08/2012 1st ov/ Wert/ cc 10 years of intermittent cough and sob responsive to short courses of prednisone progressively worse indolent onset doe even in absence of cough to point where sob across the parking lot then developed cp comes goes x 2 months lasting up to a half a day unless takes vicodin. Location is ant, generalized, assoc with overt HB, non-radiating, no worse walking. Coughing does not make it worse. rec Continue symbicort 160 Take 2 puffs first thing in am and then another 2 puffs about 12 hours later.  Pantoprazole 40 mg Take 30-60 min before first meal of the day  GERD  diet Please schedule a follow up office visit in 4 weeks, sooner if needed with pft's on return > did not return  09/15/2012 f/u ov/Wert copd/ 02 dep at baseline/ now on ACEi but quit smoking effective 08/31/12 Chief Complaint  Patient presents with  . Follow-up    Increased DOE x 2 wks, and wheezing for the past 3 days.   sob does improve with neb for an hour or two or rest  But aslo  wakes at night with hoarsness and dry cough day > niight Indolent onset, progressively worse. rec Pepcid 20 ac one at bedtime Stop lisinopril and start micardis 40 mg one daily  Plan A = automatic = symbicort 2 puff followed by one of tudorza twice daily until return Plan B = Backup = proaire up to every 4 hours only  Plan C = Nebulizer every 4 hours Prednisone 10 mg take  4 each am x 2 days,   2 each am x 2 days,  1 each am x2days and stop    12/05/2012 f/u ov/Wert still smoking/ GOLD II copd Chief Complaint  Patient presents with  . Follow-up    Review PFT. Pt reports our of Tudorza--worked very well! Pt has  lost 22lb since last OV!  still using neb q am instead of symbiocrt 2 puffs first thing in am as instructed, though much less daytime saba. Denies limited by sob from desired activities.   No obvious daytime variabilty or assoc cough or  cp or chest tightness, subjective wheeze overt sinus or hb symptoms. No unusual exp hx or h/o childhood pna/ asthma or premature birth to his knowledge.   Sleeping ok without nocturnal  or early am exacerbation  of respiratory  c/o's or need for noct saba. Also denies any obvious fluctuation of symptoms with weather or environmental changes or other aggravating or alleviating factors except as outlined above   Current Medications, Allergies, Past Medical History, Past Surgical History, Family History, and Social History were reviewed in [employer] record.  ROS  The following are not active complaints unless bolded sore throat, dysphagia, dental problems, itching, sneezing,  nasal congestion or excess/ purulent secretions, ear ache,   fever, chills, sweats, unintended wt loss, pleuritic or exertional cp, hemoptysis,  orthopnea pnd or leg swelling, presyncope, palpitations, heartburn, abdominal pain, anorexia, nausea, vomiting, diarrhea  or change in bowel or urinary habits, change in stools or urine, dysuria,hematuria,  rash, arthralgias, visual complaints, headache, numbness weakness or ataxia or problems  with walking or coordination,  change in mood/affect or memory.            Objective:   Physical Exam  amb obese bf nad 12/05/2012          223 Wt Readings from Last 3 Encounters:  09/15/12 245 lb (111.131 kg)  05/08/12 253 lb 3.2 oz (114.851 kg)  12/17/11 241 lb 4 oz (109.43 kg)      HEENT mild turbinate edema.  Oropharynx no thrush or excess pnd or cobblestoning.  No JVD or cervical adenopathy. Mild accessory muscle hypertrophy. Trachea midline, nl thryroid. Chest was hyperinflated by percussion with diminished breath sounds and  moderate increased exp time without wheeze. Hoover sign positive at mid inspiration. Regular rate and rhythm without murmur gallop or rub or increase P2 or edema.  Abd: no hsm, nl excursion. Ext warm without cyanosis or clubbing.     CXR  05/08/2012 : Chronic changes consistent with bronchitis and scarring. No active lung disease.         Assessment & Plan:

## 2012-12-05 NOTE — Assessment & Plan Note (Signed)
>   3 min  I reviewed the Flethcher curve with patient that basically indicates  if you quit smoking when your best day FEV1 is still well preserved (as hers is now) it is highly unlikely you will progress to severe disease and informed the patient there was no medication on the market that has proven to change the curve or the likelihood of progression.  Therefore stopping smoking and maintaining abstinence is the most important aspect of care, not choice of inhalers or for that matter, doctors.

## 2012-12-05 NOTE — Assessment & Plan Note (Addendum)
-   quit smoking around 08/31/12  - ACEi d/c effective 09/16/2012 due to cough - 12/05/2012 PFTs FEV1 1.22 (58%)  64 and no better p B2,  DLCO 59 and corrrects to 74  The proper method of use, as well as anticipated side effects, of a metered-dose inhaler are discussed and demonstrated to the patient. Improved effectiveness after extensive coaching during this visit to a level of approximately  75%   Should be fine with symbicort and prn saba otherwise add back lama  Key is stop smoking now (discussed separately)

## 2012-12-05 NOTE — Progress Notes (Signed)
PFT done today. 

## 2012-12-05 NOTE — Assessment & Plan Note (Addendum)
Trial off acei effective 09/16/2012 due to cough  > resolved on micardis 40 mg daily   Adequate control on present rx, reviewed > no change in rx needed  (continue to avoid acei indefinitely)

## 2012-12-05 NOTE — Patient Instructions (Addendum)
Continue Pepcid 20 ac one at bedtime  micardis 40 mg one daily  (80 mg one half daily)  Plan A = automatic =  symbicort 160 Take 2 puffs first thing in am and then another 2 puffs about 12 hours later.    Plan B = Backup = proaire up to every 4 hours only as needed  Plan C = Nebulizer every 4 hours only if  B not effective   The key is to stop smoking completely before smoking completely stops you!   Please schedule a follow up visit in 3 months but call sooner if needed

## 2012-12-08 ENCOUNTER — Other Ambulatory Visit: Payer: Self-pay | Admitting: Internal Medicine

## 2012-12-08 ENCOUNTER — Telehealth: Payer: Self-pay | Admitting: *Deleted

## 2012-12-08 MED ORDER — TELMISARTAN 80 MG PO TABS: 40.0000 mg | ORAL_TABLET | Freq: Every day | ORAL | Status: DC

## 2012-12-08 NOTE — Telephone Encounter (Signed)
Change to diovan 80 mg daily

## 2012-12-08 NOTE — Telephone Encounter (Signed)
Called [phone] to do the PA for the micardis.  This is on the non formulary  And they will not cover this medication unless the pt has tried and failed an ACE inhibitor like amlodipine or lotrel.   They will cover diovan or losartan.   MW please advise if ok to change the micardis to another medication.  thanks

## 2012-12-11 MED ORDER — VALSARTAN 80 MG PO TABS: 80.0000 mg | ORAL_TABLET | Freq: Every day | ORAL | Status: DC

## 2012-12-11 NOTE — Telephone Encounter (Signed)
I called and made pt aware. She stated call this [facility]. RX has been sent

## 2012-12-11 NOTE — Telephone Encounter (Signed)
LMTCB x1 for pt.  

## 2012-12-11 NOTE — Telephone Encounter (Signed)
Patient returning call.

## 2012-12-12 ENCOUNTER — Encounter: Payer: Self-pay | Admitting: Internal Medicine

## 2012-12-14 ENCOUNTER — Other Ambulatory Visit: Payer: Self-pay | Admitting: Internal Medicine

## 2013-03-05 ENCOUNTER — Ambulatory Visit (INDEPENDENT_AMBULATORY_CARE_PROVIDER_SITE_OTHER): Payer: Medicaid Other | Admitting: Internal Medicine

## 2013-03-05 ENCOUNTER — Encounter: Payer: Self-pay | Admitting: Internal Medicine

## 2013-03-05 VITALS — BP 112/72 | HR 80 | Temp 98.4°F | Ht 63.0 in | Wt 217.0 lb

## 2013-03-05 DIAGNOSIS — J449 Chronic obstructive pulmonary disease, unspecified: Secondary | ICD-10-CM

## 2013-03-05 NOTE — Patient Instructions (Signed)
Ok to just use the symbicort 160 up to 2 puffs every 12 hours as needed for any respiratory problems but if you start needing the rescue inhaler then really need to be more consistent with use of symbicort (not as needed)    If you are satisfied with your treatment plan let your doctor know and he/she can either refill your medications or you can return here when your prescription runs out.     If in any way you are not 100% satisfied,  please tell us.  If 100% better, tell your friends!

## 2013-03-05 NOTE — Progress Notes (Signed)
Subjective:    Patient ID: Robin Clayton, female    DOB: [birth year]  MRN: [id number]   Brief patient profile:  22 yobf smoker no problems until around 2000 referred to pulmonary clinic 05/08/2012 by Dr Asencion Partridge with baseline wt 150 but freq steroids required for flares of ab  to wt 253 at first pulmonary clinic ov and documented GOLD II copd 12/05/12    History of Present Illness  05/08/2012 1st ov/ Wert/ cc 10 years of intermittent cough and sob responsive to short courses of prednisone progressively worse indolent onset doe even in absence of cough to point where sob across the parking lot then developed cp comes goes x 2 months lasting up to a half a day unless takes vicodin. Location is ant, generalized, assoc with overt HB, non-radiating, no worse walking. Coughing does not make it worse. rec Continue symbicort 160 Take 2 puffs first thing in am and then another 2 puffs about 12 hours later.  Pantoprazole 40 mg Take 30-60 min before first meal of the day  GERD  diet Please schedule a follow up office visit in 4 weeks, sooner if needed with pft's on return > did not return  09/15/2012 f/u ov/Wert copd/ 02 dep at baseline/ now on ACEi but quit smoking effective 08/31/12 Chief Complaint  Patient presents with  . Follow-up    Increased DOE x 2 wks, and wheezing for the past 3 days.   sob does improve with neb for an hour or two or rest  But aslo  wakes at night with hoarsness and dry cough day > niight Indolent onset, progressively worse. rec Pepcid 20 ac one at bedtime Stop lisinopril and start micardis 40 mg one daily  Plan A = automatic = symbicort 2 puff followed by one of tudorza twice daily until return Plan B = Backup = proaire up to every 4 hours only  Plan C = Nebulizer every 4 hours Prednisone 10 mg take  4 each am x 2 days,   2 each am x 2 days,  1 each am x2days and stop        03/05/2013 f/u ov/Wert re: COPD GOLD II Chief Complaint  Patient presents with  . Follow-up   Pt states that her breathing is doing well. She uses albuterol inhaler approx 3 times per wk on average- mainly in the am's.     She is confusing symbicort and saba and turns out she's just using just the symbicort occ in ams maybe 3 x weekly s limiting doe  No obvious pattern to mild day to   variabilty or assoc cough or  cp or chest tightness, subjective wheeze overt sinus or hb symptoms. No unusual exp hx or h/o childhood pna/ asthma or premature birth to his knowledge.   Sleeping ok without nocturnal  or early am exacerbation  of respiratory  c/o's or need for noct saba. Also denies any obvious fluctuation of symptoms with weather or environmental changes or other aggravating or alleviating factors except as outlined above   Current Medications, Allergies, Past Medical History, Past Surgical History, Family History, and Social History were reviewed in [employer] record.  ROS  The following are not active complaints unless bolded sore throat, dysphagia, dental problems, itching, sneezing,  nasal congestion or excess/ purulent secretions, ear ache,   fever, chills, sweats, unintended wt loss, pleuritic or exertional cp, hemoptysis,  orthopnea pnd or leg swelling, presyncope, palpitations, heartburn, abdominal pain, anorexia, nausea, vomiting, diarrhea  or change in bowel or urinary habits, change in stools or urine, dysuria,hematuria,  rash, arthralgias, visual complaints, headache, numbness weakness or ataxia or problems with walking or coordination,  change in mood/affect or memory.            Objective:   Physical Exam  amb obese bf nad 12/05/2012          223 > 217 03/05/2013  Wt Readings from Last 3 Encounters:  09/15/12 245 lb (111.131 kg)  05/08/12 253 lb 3.2 oz (114.851 kg)  12/17/11 241 lb 4 oz (109.43 kg)      HEENT mild turbinate edema.  Oropharynx no thrush or excess pnd or cobblestoning.  No JVD or cervical adenopathy. Mild accessory muscle hypertrophy.  Trachea midline, nl thryroid. Chest was min hyperinflated by percussion with diminished breath sounds and mild increased exp time without wheeze. Hoover sign positive at end inspiration. Regular rate and rhythm without murmur gallop or rub or increase P2 or edema.  Abd: no hsm, nl excursion. Ext warm without cyanosis or clubbing.          cxr 10/03/12  1. Mild diffuse bronchial wall thickening and interstitial  prominence. Findings may suggest a mild bronchitis.      Assessment & Plan:

## 2013-03-05 NOTE — Assessment & Plan Note (Addendum)
-   hfa 75% 12/05/2012  - quit smoking around 08/31/12  - ACEi d/c effective 09/16/2012 due to cough - 12/05/2012 PFTs FEV1 1.22 (58%)  64 and no better p B2,  DLCO 59 and 74%  Doing great off acei and cigarettes despite GOLD II criteria so ok to use symbicort prn the way it's used in [place]  I had an extended discussion with the patient today lasting 15 to 20 minutes of a 25 minute visit on the following issues:  Reviewed progress to date, critical to maintain off cigs and  Low threshold to restart 2 bid dosing on symbicort    I reviewed the Flethcher curve with patient that basically indicates  if you quit smoking when your best day FEV1 is still well preserved (as hers actually is) it is highly unlikely you will progress to severe disease and informed the patient there was no medication on the market that has proven to change the curve or the likelihood of progression.  Therefore stopping  maintaining abstinence is the most important aspect of care, not choice of inhalers or for that matter, doctors.   Pulmonary f/u can be prn

## 2013-03-14 ENCOUNTER — Telehealth: Payer: Self-pay | Admitting: Internal Medicine

## 2013-03-14 ENCOUNTER — Telehealth: Payer: Self-pay | Admitting: Critical Care Medicine

## 2013-03-14 MED ORDER — LOSARTAN POTASSIUM 50 MG PO TABS: 50.0000 mg | ORAL_TABLET | Freq: Every day | ORAL | Status: DC

## 2013-03-14 NOTE — Telephone Encounter (Signed)
Her bp was fine at last ov so if she was on diovan then she was on diovan then she'll need an insurance equivalent/ alternative  Cozaar 50 mg daily should do until she see's her primary and so only give 3 months

## 2013-03-14 NOTE — Telephone Encounter (Signed)
Med not called in.  I resent losartan again

## 2013-03-14 NOTE — Telephone Encounter (Signed)
Pt aware of recs. RX SENT. NOTHING FURTHER NEEDED

## 2013-03-14 NOTE — Telephone Encounter (Signed)
I spoke with pt. She reports her insurance will not approve the diovan. She reports she has been taking this daily. According to medicine list this was taken off 03/05/13. She was in to see MW this date as well. It does not state anything about not taking this medication. Please advise if pt is to be on diovan? thanks

## 2013-03-15 ENCOUNTER — Telehealth: Payer: Self-pay | Admitting: Internal Medicine

## 2013-03-15 MED ORDER — AMLODIPINE-OLMESARTAN 5-20 MG PO TABS: 1.0000 | ORAL_TABLET | Freq: Every day | ORAL | Status: DC

## 2013-03-15 NOTE — Telephone Encounter (Signed)
Spoke with the pt She is completely out of her BP meds She was taking diovan 80 mg, then that was not covered so changed to cozaar 50 mg and that was not covered either  She states that her BP is 140/84  I have advised that she call and make an appt with her PCP to discuss and she states that she will, but wants samples of something to take in the meantime We do not have samples of diovan at this time Please advise thanks

## 2013-03-15 NOTE — Telephone Encounter (Signed)
She can have the azor 5-20 but she'll need to stop the norvasc

## 2013-03-15 NOTE — Telephone Encounter (Signed)
Spoke with the pt and notified of recs per MW I left her 2 boxes up front and advised that she must make appt with her PCP before they run out

## 2013-03-20 ENCOUNTER — Telehealth: Payer: Self-pay | Admitting: Internal Medicine

## 2013-03-20 NOTE — Telephone Encounter (Signed)
LMTCB

## 2013-03-21 NOTE — Telephone Encounter (Signed)
I spoke with pt. I advised her to call her insurance and see what is covered since we have called in 2 diff meds and they are not covered. She will do so. Will sign off message

## 2013-03-21 NOTE — Telephone Encounter (Signed)
[id number]. Shiquita Collignon, CMA  

## 2013-03-22 ENCOUNTER — Telehealth: Payer: Self-pay | Admitting: *Deleted

## 2013-03-22 NOTE — Telephone Encounter (Signed)
PA approved for Diovan 80mg  qd Confirm # [id number] P 03/22/2013-03/17/2014 Pharmacy notified.

## 2013-04-27 ENCOUNTER — Other Ambulatory Visit: Payer: Self-pay | Admitting: Internal Medicine

## 2013-06-12 DIAGNOSIS — Z9981 Dependence on supplemental oxygen: Secondary | ICD-10-CM | POA: Insufficient documentation

## 2013-06-12 DIAGNOSIS — N189 Chronic kidney disease, unspecified: Secondary | ICD-10-CM | POA: Insufficient documentation

## 2013-06-12 DIAGNOSIS — J449 Chronic obstructive pulmonary disease, unspecified: Secondary | ICD-10-CM | POA: Insufficient documentation

## 2013-06-12 DIAGNOSIS — J4489 Other specified chronic obstructive pulmonary disease: Secondary | ICD-10-CM | POA: Insufficient documentation

## 2013-06-12 DIAGNOSIS — E119 Type 2 diabetes mellitus without complications: Secondary | ICD-10-CM | POA: Insufficient documentation

## 2013-06-12 DIAGNOSIS — G8929 Other chronic pain: Secondary | ICD-10-CM | POA: Insufficient documentation

## 2013-06-12 DIAGNOSIS — K219 Gastro-esophageal reflux disease without esophagitis: Secondary | ICD-10-CM | POA: Insufficient documentation

## 2013-06-12 DIAGNOSIS — Z87891 Personal history of nicotine dependence: Secondary | ICD-10-CM | POA: Insufficient documentation

## 2013-06-12 DIAGNOSIS — I129 Hypertensive chronic kidney disease with stage 1 through stage 4 chronic kidney disease, or unspecified chronic kidney disease: Secondary | ICD-10-CM | POA: Insufficient documentation

## 2013-06-12 DIAGNOSIS — G4733 Obstructive sleep apnea (adult) (pediatric): Secondary | ICD-10-CM | POA: Insufficient documentation

## 2013-06-12 DIAGNOSIS — IMO0002 Reserved for concepts with insufficient information to code with codable children: Secondary | ICD-10-CM | POA: Insufficient documentation

## 2013-06-12 NOTE — ED Notes (Signed)
Pt. reports abscess at right axilla onset 2 weeks ago with drainage .

## 2013-06-13 ENCOUNTER — Emergency Department (HOSPITAL_COMMUNITY)
Admission: EM | Admit: 2013-06-13 | Discharge: 2013-06-13 | Disposition: A | Discharge status: Home / Self Care | Payer: Medicaid Other | Attending: Emergency Medicine | Admitting: Emergency Medicine

## 2013-06-13 ENCOUNTER — Encounter (HOSPITAL_COMMUNITY): Payer: Self-pay | Admitting: Emergency Medicine

## 2013-06-13 DIAGNOSIS — L0390 Cellulitis, unspecified: Secondary | ICD-10-CM

## 2013-06-13 DIAGNOSIS — L0291 Cutaneous abscess, unspecified: Secondary | ICD-10-CM

## 2013-06-13 MED ORDER — FENTANYL CITRATE 0.05 MG/ML IJ SOLN
INTRAMUSCULAR | Status: AC
  Filled 2013-06-13: qty 2

## 2013-06-13 MED ORDER — SULFAMETHOXAZOLE-TRIMETHOPRIM 800-160 MG PO TABS: 1.0000 | ORAL_TABLET | Freq: Two times a day (BID) | ORAL | Status: DC

## 2013-06-13 MED ORDER — OXYCODONE-ACETAMINOPHEN 5-325 MG PO TABS: 1.0000 | ORAL_TABLET | Freq: Four times a day (QID) | ORAL | Status: DC | PRN

## 2013-06-13 NOTE — ED Provider Notes (Signed)
CSN: [id number]     Arrival date & time 06/12/13  2257 History   None    Chief Complaint  Patient presents with  . Abscess     (Consider location/radiation/quality/duration/timing/severity/associated sxs/prior Treatment) HPI  Patient to the ER with complaints of abscess to right axilla. She says it started 2 weeks ago and has drained intermittently. She waited so long because she knows the I&D is very painful and hoped it would go away on its own with warm compresses. She admits to having had these before. She is a diabetic but feels as though her sugars have been at her baseline. She has not been feeling weak, febrile, confused, no SOB, CP, confusion.  Past Medical History  Diagnosis Date  . Hypertension   . Obstructive sleep apnea     compliant with cpap  . COPD (chronic obstructive pulmonary disease)   . GERD (gastroesophageal reflux disease)     takes tums occ  . Constipation   . Chronic bronchitis   . Type II diabetes mellitus   . Chronic lower back pain   . Chronic renal insufficiency    Past Surgical History  Procedure Laterality Date  . Cesarean section  1970  . Breast biopsy  12/06/2011    Procedure: BREAST BIOPSY WITH NEEDLE LOCALIZATION;  Surgeon: Rolm Bookbinder, MD;  Location: [facility] OR;  Service: General;  Laterality: Left;   Family History  Problem Relation Age of Onset  . Stomach cancer Mother 66  . Hypertension Mother   . Hyperlipidemia Mother   . Diabetes Mother   . Cancer Mother     stomach  . Colon cancer Neg Hx   . Hypertension Father   . Diabetes Father   . Heart attack Father   . Asthma Sister    History  Substance Use Topics  . Smoking status: Former Smoker -- 0.50 packs/day for 38 years    Types: Cigarettes    Quit date: 09/01/2012  . Smokeless tobacco: Never Used  . Alcohol Use: No   OB History   Grav Para Term Preterm Abortions TAB SAB Ect Mult Living                 Review of Systems  The patient denies anorexia, fever, weight  loss,, vision loss, decreased hearing, hoarseness, chest pain, syncope, dyspnea on exertion, peripheral edema, balance deficits, hemoptysis, abdominal pain, melena, hematochezia, severe indigestion/heartburn, hematuria, incontinence, genital sores, muscle weakness, suspicious skin lesions, transient blindness, difficulty walking, depression, unusual weight change, abnormal bleeding, enlarged lymph nodes, angioedema, and breast masses.   Allergies  Review of patient's allergies indicates no known allergies.  Home Medications   Current Outpatient Rx  Name  Route  Sig  Dispense  Refill  . albuterol (PROAIR HFA) 108 (90 BASE) MCG/ACT inhaler   Inhalation   Inhale 2 puffs into the lungs every 6 (six) hours as needed for wheezing.         Marland Kitchen albuterol (PROVENTIL) (2.5 MG/3ML) 0.083% nebulizer solution   Nebulization   Take 2.5 mg by nebulization 3 (three) times daily as needed. For shortness of breath         . amLODipine (NORVASC) 10 MG tablet   Oral   Take 10 mg by mouth daily.         Marland Kitchen amLODipine-olmesartan (AZOR) 5-20 MG per tablet   Oral   Take 1 tablet by mouth daily.   10 tablet   0   . atorvastatin (LIPITOR) 10 MG tablet  Oral   Take 10 mg by mouth every evening.          . budesonide-formoterol (SYMBICORT) 160-4.5 MCG/ACT inhaler   Inhalation   Inhale 2 puffs into the lungs 2 (two) times daily.         . cloNIDine (CATAPRES) 0.2 MG tablet   Oral   Take 1 tablet (0.2 mg total) by mouth 3 (three) times daily.   90 tablet   0   . glipiZIDE (GLUCOTROL) 5 MG tablet   Oral   Take 2.5 mg by mouth daily.          . hydrochlorothiazide (HYDRODIURIL) 25 MG tablet   Oral   Take 25 mg by mouth daily.         Marland Kitchen HYDROcodone-acetaminophen (NORCO/VICODIN) 5-325 MG per tablet      1-2 tablets po q 6 hours prn moderate to severe pain   15 tablet   0   . losartan (COZAAR) 50 MG tablet   Oral   Take 1 tablet (50 mg total) by mouth daily.   30 tablet   3   .  oxyCODONE-acetaminophen (PERCOCET/ROXICET) 5-325 MG per tablet   Oral   Take 1 tablet by mouth every 6 (six) hours as needed for severe pain.   20 tablet   0   . pantoprazole (PROTONIX) 40 MG tablet      TAKE 1 TABLET BY MOUTH EVERY DAY 30-60 MINUTES BEFORE MAIN MEAL   90 tablet   2     **Patient requests 90 days supply**   . sulfamethoxazole-trimethoprim (SEPTRA DS) 800-160 MG per tablet   Oral   Take 1 tablet by mouth every 12 (twelve) hours.   14 tablet   0    BP 135/72  Pulse 80  Temp(Src) 97.4 F (36.3 C) (Oral)  Resp 18  SpO2 95% Physical Exam  Nursing note and vitals reviewed. Constitutional: She appears well-developed and well-nourished. No distress.  HENT:  Head: Normocephalic and atraumatic.  Eyes: Pupils are equal, round, and reactive to light.  Neck: Normal range of motion. Neck supple.  Cardiovascular: Normal rate and regular rhythm.   Pulmonary/Chest: Effort normal.  Abdominal: Soft.  Musculoskeletal:       Arms: Neurological: She is alert.  Skin: Skin is warm and dry.    ED Course  Procedures (including critical care time) Labs Review Labs Reviewed - No data to display Imaging Review No results found.  EKG Interpretation   None       MDM   Final diagnoses:  Abscess and cellulitis    Patient given 100 mcg of Fentanyl IM in the ED for pain control.   INCISION AND DRAINAGE Performed by: Linus Mako Consent: Verbal consent obtained. Risks and benefits: risks, benefits and alternatives were discussed Type: abscess  Body area:right axilla  Anesthesia: local infiltration  Incision was made with a scalpel.  Local anesthetic: lidocaine 2 % with epinephrine  Anesthetic total: 5 ml  Complexity: complex Blunt dissection to break up loculations  Drainage: purulent  Drainage amount: large   Patient tolerance: Patient tolerated the procedure well with no immediate complications.  Patient also placed on bactrim for  cellulitis and given Rx for pain medication.   58 y.o.Arrington Connolly's evaluation in the Emergency Department is complete. It has been determined that no acute conditions requiring further emergency intervention are present at this time. The patient/guardian have been advised of the diagnosis and plan. We have discussed signs and symptoms that warrant  return to the ED, such as changes or worsening in symptoms.  Vital signs are stable at discharge. Filed Vitals:   06/12/13 2347  BP: 135/72  Pulse: 80  Temp: 97.4 F (36.3 C)  Resp: 18    Patient/guardian has voiced understanding and agreed to follow-up with the PCP or specialist.   Linus Mako, PA-C 06/13/13 0302

## 2013-06-13 NOTE — ED Provider Notes (Signed)
Medical screening examination/treatment/procedure(s) were performed by non-physician practitioner and as supervising physician I was immediately available for consultation/collaboration.    Hajer Dwyer, MD 06/13/13 0709 

## 2013-07-05 ENCOUNTER — Ambulatory Visit: Payer: Medicaid Other | Admitting: Cardiology

## 2013-07-10 ENCOUNTER — Ambulatory Visit (INDEPENDENT_AMBULATORY_CARE_PROVIDER_SITE_OTHER): Payer: Medicaid Other | Admitting: Internal Medicine

## 2013-07-10 ENCOUNTER — Encounter: Payer: Self-pay | Admitting: Internal Medicine

## 2013-07-10 VITALS — BP 124/72 | HR 84 | Ht 63.0 in | Wt 207.5 lb

## 2013-07-10 DIAGNOSIS — G4734 Idiopathic sleep related nonobstructive alveolar hypoventilation: Secondary | ICD-10-CM | POA: Insufficient documentation

## 2013-07-10 DIAGNOSIS — R0902 Hypoxemia: Secondary | ICD-10-CM

## 2013-07-10 DIAGNOSIS — J449 Chronic obstructive pulmonary disease, unspecified: Secondary | ICD-10-CM

## 2013-07-10 NOTE — Assessment & Plan Note (Signed)
-   quit smoking around 08/31/12  - ACEi d/c effective 09/16/2012 due to cough - 12/05/2012 PFTs FEV1 1.22 (58%)  64 and no better p B2,  DLCO 59 and 74%   The proper method of use, as well as anticipated side effects, of a metered-dose inhaler are discussed and demonstrated to the patient. Improved effectiveness after extensive coaching during this visit to a level of approximately  75% so continue "prn symbicort 160 2 bid"  See instructions for specific recommendations which were reviewed directly with the patient who was given a copy with highlighter outlining the key components.   She's done much better since quit smoking so pulmonary f/u is prn

## 2013-07-10 NOTE — Patient Instructions (Addendum)
Ok to just use the symbicort 160 up to 2 puffs every 12 hours as needed for any respiratory problems but if you start needing the rescue inhaler then really need to be more consistent with use of symbicort (not as needed) 2 puffs perfectly regularly every 12 hours   Please see patient coordinator before you leave today  to schedule overnight 02 sats room air    If you are satisfied with your treatment plan let your doctor know and he/she can either refill your medications or you can return here when your prescription runs out.     If in any way you are not 100% satisfied,  please tell us.  If 100% better, tell your friends!

## 2013-07-10 NOTE — Progress Notes (Signed)
Subjective:    Patient ID: Robin Clayton, female    DOB: 1955-09-27  MRN: [id number]   Brief patient profile:  44 yobf  Quit smoking 08/2012   no problems until around 2000 referred to pulmonary clinic 05/08/2012 by Dr Billey Chang with baseline wt 150 but freq steroids required for flares of ab  to wt 253 at first pulmonary clinic ov and documented GOLD II copd 12/05/12    History of Present Illness  05/08/2012 1st ov/ Robin Clayton/ cc 10 years of intermittent cough and sob responsive to short courses of prednisone progressively worse indolent onset doe even in absence of cough to point where sob across the parking lot then developed cp comes goes x 2 months lasting up to a half a day unless takes vicodin. Location is ant, generalized, assoc with overt HB, non-radiating, no worse walking. Coughing does not make it worse. rec Continue symbicort 160 Take 2 puffs first thing in am and then another 2 puffs about 12 hours later.  Pantoprazole 40 mg Take 30-60 min before first meal of the day  GERD  diet Please schedule a follow up office visit in 4 weeks, sooner if needed with pft's on return > did not return  09/15/2012 f/u ov/Robin Clayton copd/ 02 dep at baseline/ now on ACEi but quit smoking effective 08/31/12 Chief Complaint  Patient presents with  . Follow-up    Increased DOE x 2 wks, and wheezing for the past 3 days.   sob does improve with neb for an hour or two or rest  But aslo  wakes at night with hoarsness and dry cough day > niight Indolent onset, progressively worse. rec Pepcid 20 ac one at bedtime Stop lisinopril and start micardis 40 mg one daily  Plan A = automatic = symbicort 2 puff followed by one of tudorza twice daily until return Plan B = Backup = proaire up to every 4 hours only  Plan C = Nebulizer every 4 hours Prednisone 10 mg take  4 each am x 2 days,   2 each am x 2 days,  1 each am x2days and stop        03/05/2013 f/u ov/Robin Clayton re: COPD GOLD II Chief Complaint  Patient presents with   . Follow-up    Pt states that her breathing is doing well. She uses albuterol inhaler approx 3 times per wk on average- mainly in the am's.     She is confusing symbicort and saba and turns out she's just using just the symbicort occ in ams maybe 3 x weekly s limiting doe rec Ok to just use the symbicort 160 up to 2 puffs every 12 hours as needed for any respiratory problems but if you start needing the rescue inhaler then really need to be more consistent with use of symbicort (not as needed)    07/10/2013 f/u ov/Robin Clayton re: GOLD II COPD using symbicort 4 x a month / has had 02 x years for prn hs use Chief Complaint  Patient presents with  . Follow-up    Pt here to recertify for 02 qhs.  SOB with exertion, no other complaints.   really Not limited by breathing from desired activities    No obvious pattern to mild day to   variabilty or assoc cough or  cp or chest tightness, subjective wheeze overt sinus or hb symptoms. No unusual exp hx or h/o childhood pna/ asthma or premature birth to his knowledge.   Sleeping ok without nocturnal  or  early am exacerbation  of respiratory  c/o's or need for noct saba. Also denies any obvious fluctuation of symptoms with weather or environmental changes or other aggravating or alleviating factors except as outlined above   Current Medications, Allergies, Past Medical History, Past Surgical History, Family History, and Social History were reviewed in [employer] record.  ROS  The following are not active complaints unless bolded sore throat, dysphagia, dental problems, itching, sneezing,  nasal congestion or excess/ purulent secretions, ear ache,   fever, chills, sweats, unintended wt loss, pleuritic or exertional cp, hemoptysis,  orthopnea pnd or leg swelling, presyncope, palpitations, heartburn, abdominal pain, anorexia, nausea, vomiting, diarrhea  or change in bowel or urinary habits, change in stools or urine, dysuria,hematuria,  rash,  arthralgias, visual complaints, headache, numbness weakness or ataxia or problems with walking or coordination,  change in mood/affect or memory.            Objective:   Physical Exam  amb obese bf nad 12/05/2012          223 > 217 03/05/2013 > 207 07/10/2013  Wt Readings from Last 3 Encounters:  09/15/12 245 lb (111.131 kg)  05/08/12 253 lb 3.2 oz (114.851 kg)  12/17/11 241 lb 4 oz (109.43 kg)      HEENT mild turbinate edema.  Oropharynx no thrush or excess pnd or cobblestoning.  No JVD or cervical adenopathy. Mild accessory muscle hypertrophy. Trachea midline, nl thryroid. Chest was min hyperinflated by percussion with diminished breath sounds and mild increased exp time without wheeze. Hoover sign positive at end inspiration. Regular rate and rhythm without murmur gallop or rub or increase P2 or edema.  Abd: no hsm, nl excursion. Ext warm without cyanosis or clubbing.          cxr 10/03/12  1. Mild diffuse bronchial wall thickening and interstitial  prominence. Findings may suggest a mild bronchitis.      Assessment & Plan:

## 2013-07-10 NOTE — Assessment & Plan Note (Signed)
-   on CPAP and 02 prn chronically but doesn't use either consistently as of 07/10/2013  - ono RA 07/10/2013 (would prefer to keep 02 if qualifies off cpap) >>>

## 2013-07-15 ENCOUNTER — Encounter: Payer: Self-pay | Admitting: *Deleted

## 2013-07-19 ENCOUNTER — Emergency Department (HOSPITAL_COMMUNITY)
Admission: EM | Admit: 2013-07-19 | Discharge: 2013-07-20 | Disposition: A | Discharge status: Home / Self Care | Payer: Medicaid Other | Attending: Emergency Medicine | Admitting: Emergency Medicine

## 2013-07-19 ENCOUNTER — Encounter (HOSPITAL_COMMUNITY): Payer: Self-pay | Admitting: Emergency Medicine

## 2013-07-19 ENCOUNTER — Emergency Department (HOSPITAL_COMMUNITY): Payer: Medicaid Other

## 2013-07-19 DIAGNOSIS — J069 Acute upper respiratory infection, unspecified: Secondary | ICD-10-CM | POA: Insufficient documentation

## 2013-07-19 DIAGNOSIS — M545 Low back pain, unspecified: Secondary | ICD-10-CM | POA: Insufficient documentation

## 2013-07-19 DIAGNOSIS — Z9981 Dependence on supplemental oxygen: Secondary | ICD-10-CM | POA: Insufficient documentation

## 2013-07-19 DIAGNOSIS — J440 Chronic obstructive pulmonary disease with acute lower respiratory infection: Secondary | ICD-10-CM | POA: Insufficient documentation

## 2013-07-19 DIAGNOSIS — J40 Bronchitis, not specified as acute or chronic: Secondary | ICD-10-CM

## 2013-07-19 DIAGNOSIS — I129 Hypertensive chronic kidney disease with stage 1 through stage 4 chronic kidney disease, or unspecified chronic kidney disease: Secondary | ICD-10-CM | POA: Insufficient documentation

## 2013-07-19 DIAGNOSIS — G4733 Obstructive sleep apnea (adult) (pediatric): Secondary | ICD-10-CM | POA: Insufficient documentation

## 2013-07-19 DIAGNOSIS — K219 Gastro-esophageal reflux disease without esophagitis: Secondary | ICD-10-CM | POA: Insufficient documentation

## 2013-07-19 DIAGNOSIS — Z87891 Personal history of nicotine dependence: Secondary | ICD-10-CM | POA: Insufficient documentation

## 2013-07-19 DIAGNOSIS — N189 Chronic kidney disease, unspecified: Secondary | ICD-10-CM | POA: Insufficient documentation

## 2013-07-19 DIAGNOSIS — E119 Type 2 diabetes mellitus without complications: Secondary | ICD-10-CM | POA: Insufficient documentation

## 2013-07-19 DIAGNOSIS — Z79899 Other long term (current) drug therapy: Secondary | ICD-10-CM | POA: Insufficient documentation

## 2013-07-19 DIAGNOSIS — G8929 Other chronic pain: Secondary | ICD-10-CM | POA: Insufficient documentation

## 2013-07-19 DIAGNOSIS — J209 Acute bronchitis, unspecified: Secondary | ICD-10-CM | POA: Insufficient documentation

## 2013-07-19 DIAGNOSIS — IMO0002 Reserved for concepts with insufficient information to code with codable children: Secondary | ICD-10-CM | POA: Insufficient documentation

## 2013-07-19 LAB — COMPREHENSIVE METABOLIC PANEL
ALT: 12 U/L (ref 0–35)
AST: 13 U/L (ref 0–37)
Albumin: 4.3 g/dL (ref 3.5–5.2)
Alkaline Phosphatase: 56 U/L (ref 39–117)
BILIRUBIN TOTAL: 0.5 mg/dL (ref 0.3–1.2)
BUN: 24 mg/dL — AB (ref 6–23)
CHLORIDE: 93 meq/L — AB (ref 96–112)
CO2: 26 meq/L (ref 19–32)
CREATININE: 1.57 mg/dL — AB (ref 0.50–1.10)
Calcium: 10.1 mg/dL (ref 8.4–10.5)
GFR calc Af Amer: 41 mL/min — ABNORMAL LOW (ref >90)
GFR, EST NON AFRICAN AMERICAN: 36 mL/min — AB (ref >90)
GLUCOSE: 111 mg/dL — AB (ref 70–99)
Potassium: 3.6 mEq/L — ABNORMAL LOW (ref 3.7–5.3)
Sodium: 136 mEq/L — ABNORMAL LOW (ref 137–147)
Total Protein: 8.2 g/dL (ref 6.0–8.3)

## 2013-07-19 LAB — CBC WITH DIFFERENTIAL/PLATELET
Basophils Absolute: 0 10*3/uL (ref 0.0–0.1)
Basophils Relative: 0 % (ref 0–1)
Eosinophils Absolute: 0.2 10*3/uL (ref 0.0–0.7)
Eosinophils Relative: 2 % (ref 0–5)
HEMATOCRIT: 43.4 % (ref 36.0–46.0)
HEMOGLOBIN: 14.6 g/dL (ref 12.0–15.0)
LYMPHS PCT: 35 % (ref 12–46)
Lymphs Abs: 3.1 10*3/uL (ref 0.7–4.0)
MCH: 27.3 pg (ref 26.0–34.0)
MCHC: 33.6 g/dL (ref 30.0–36.0)
MCV: 81.3 fL (ref 78.0–100.0)
MONO ABS: 0.7 10*3/uL (ref 0.1–1.0)
MONOS PCT: 8 % (ref 3–12)
Neutro Abs: 4.7 10*3/uL (ref 1.7–7.7)
Neutrophils Relative %: 54 % (ref 43–77)
Platelets: 178 10*3/uL (ref 150–400)
RBC: 5.34 MIL/uL — ABNORMAL HIGH (ref 3.87–5.11)
RDW: 15 % (ref 11.5–15.5)
WBC: 8.8 10*3/uL (ref 4.0–10.5)

## 2013-07-19 MED ORDER — ALBUTEROL SULFATE (2.5 MG/3ML) 0.083% IN NEBU
2.5000 mg | INHALATION_SOLUTION | Freq: Once | RESPIRATORY_TRACT | Status: AC
  Administered 2013-07-20: 2.5 mg via RESPIRATORY_TRACT
  Filled 2013-07-19: qty 3

## 2013-07-19 MED ORDER — DM-GUAIFENESIN ER 30-600 MG PO TB12
1.0000 | ORAL_TABLET | Freq: Once | ORAL | Status: AC
  Administered 2013-07-20: 1 via ORAL
  Filled 2013-07-19: qty 1

## 2013-07-19 MED ORDER — AZITHROMYCIN 250 MG PO TABS: 250.0000 mg | ORAL_TABLET | Freq: Every day | ORAL | Status: DC

## 2013-07-19 MED ORDER — DM-GUAIFENESIN ER 30-600 MG PO TB12: 1.0000 | ORAL_TABLET | Freq: Two times a day (BID) | ORAL | Status: DC | PRN

## 2013-07-19 MED ORDER — SODIUM CHLORIDE 0.9 % IV BOLUS (SEPSIS): 1000.0000 mL | Freq: Once | INTRAVENOUS | Status: DC

## 2013-07-19 MED ORDER — AZITHROMYCIN 250 MG PO TABS
500.0000 mg | ORAL_TABLET | Freq: Once | ORAL | Status: AC
  Administered 2013-07-20: 500 mg via ORAL
  Filled 2013-07-19: qty 2

## 2013-07-19 NOTE — ED Notes (Signed)
Pt reports generalized body aches for last 3 days. Reports coughing up clear sputum and running a fever. Bilateral lung sounds CTA.

## 2013-07-19 NOTE — Discharge Instructions (Signed)
Bronchitis °Bronchitis is inflammation of the airways that extend from the windpipe into the lungs (bronchi). The inflammation often causes mucus to develop, which leads to a cough. If the inflammation becomes severe, it may cause shortness of breath. °CAUSES  °Bronchitis may be caused by:  °· Viral infections.   °· Bacteria.   °· Cigarette smoke.   °· Allergens, pollutants, and other irritants.   °SIGNS AND SYMPTOMS  °The most common symptom of bronchitis is a frequent cough that produces mucus. Other symptoms include: °· Fever.   °· Body aches.   °· Chest congestion.   °· Chills.   °· Shortness of breath.   °· Sore throat.   °DIAGNOSIS  °Bronchitis is usually diagnosed through a medical history and physical exam. Tests, such as chest X-rays, are sometimes done to rule out other conditions.  °TREATMENT  °You may need to avoid contact with whatever caused the problem (smoking, for example). Medicines are sometimes needed. These may include: °· Antibiotics. These may be prescribed if the condition is caused by bacteria. °· Cough suppressants. These may be prescribed for relief of cough symptoms.   °· Inhaled medicines. These may be prescribed to help open your airways and make it easier for you to breathe.   °· Steroid medicines. These may be prescribed for those with recurrent (chronic) bronchitis. °HOME CARE INSTRUCTIONS °· Get plenty of rest.   °· Drink enough fluids to keep your urine clear or pale yellow (unless you have a medical condition that requires fluid restriction). Increasing fluids may help thin your secretions and will prevent dehydration.   °· Only take over-the-counter or prescription medicines as directed by your health care provider. °· Only take antibiotics as directed. Make sure you finish them even if you start to feel better. °· Avoid secondhand smoke, irritating chemicals, and strong fumes. These will make bronchitis worse. If you are a smoker, quit smoking. Consider using nicotine gum or  skin patches to help control withdrawal symptoms. Quitting smoking will help your lungs heal faster.   °· Put a cool-mist humidifier in your bedroom at night to moisten the air. This may help loosen mucus. Change the water in the humidifier daily. You can also run the hot water in your shower and sit in the bathroom with the door closed for 5 10 minutes.   °· Follow up with your health care provider as directed.   °· Wash your hands frequently to avoid catching bronchitis again or spreading an infection to others.   °SEEK MEDICAL CARE IF: °Your symptoms do not improve after 1 week of treatment.  °SEEK IMMEDIATE MEDICAL CARE IF: °· Your fever increases. °· You have chills.   °· You have chest pain.   °· You have worsening shortness of breath.   °· You have bloody sputum. °· You faint.   °· You have lightheadedness. °· You have a severe headache.   °· You vomit repeatedly. °MAKE SURE YOU:  °· Understand these instructions. °· Will watch your condition. °· Will get help right away if you are not doing well or get worse. °Document Released: 04/19/2005 Document Revised: 02/07/2013 Document Reviewed: 12/12/2012 °[employer]® Patient Information ©2014 [employer]. ° °Upper Respiratory Infection, Adult °An upper respiratory infection (URI) is also sometimes known as the common cold. The upper respiratory tract includes the nose, sinuses, throat, trachea, and bronchi. Bronchi are the airways leading to the lungs. Most people improve within 1 week, but symptoms can last up to 2 weeks. A residual cough may last even longer.  °CAUSES °Many different viruses can infect the tissues lining the upper respiratory tract. The tissues become irritated   and inflamed and often become very moist. Mucus production is also common. A cold is contagious. You can easily spread the virus to others by oral contact. This includes kissing, sharing a glass, coughing, or sneezing. Touching your mouth or nose and then touching a surface, which is then  touched by another person, can also spread the virus. SYMPTOMS  Symptoms typically develop 1 to 3 days after you come in contact with a cold virus. Symptoms vary from person to person. They may include:  Runny nose.  Sneezing.  Nasal congestion.  Sinus irritation.  Sore throat.  Loss of voice (laryngitis).  Cough.  Fatigue.  Muscle aches.  Loss of appetite.  Headache.  Low-grade fever. DIAGNOSIS  You might diagnose your own cold based on familiar symptoms, since most people get a cold 2 to 3 times a year. Your caregiver can confirm this based on your exam. Most importantly, your caregiver can check that your symptoms are not due to another disease such as strep throat, sinusitis, pneumonia, asthma, or epiglottitis. Blood tests, throat tests, and X-rays are not necessary to diagnose a common cold, but they may sometimes be helpful in excluding other more serious diseases. Your caregiver will decide if any further tests are required. RISKS AND COMPLICATIONS  You may be at risk for a more severe case of the common cold if you smoke cigarettes, have chronic heart disease (such as heart failure) or lung disease (such as asthma), or if you have a weakened immune system. The very young and very old are also at risk for more serious infections. Bacterial sinusitis, middle ear infections, and bacterial pneumonia can complicate the common cold. The common cold can worsen asthma and chronic obstructive pulmonary disease (COPD). Sometimes, these complications can require emergency medical care and may be life-threatening. PREVENTION  The best way to protect against getting a cold is to practice good hygiene. Avoid oral or hand contact with people with cold symptoms. Wash your hands often if contact occurs. There is no clear evidence that vitamin C, vitamin E, echinacea, or exercise reduces the chance of developing a cold. However, it is always recommended to get plenty of rest and practice good  nutrition. TREATMENT  Treatment is directed at relieving symptoms. There is no cure. Antibiotics are not effective, because the infection is caused by a virus, not by bacteria. Treatment may include:  Increased fluid intake. Sports drinks offer valuable electrolytes, sugars, and fluids.  Breathing heated mist or steam (vaporizer or shower).  Eating chicken soup or other clear broths, and maintaining good nutrition.  Getting plenty of rest.  Using gargles or lozenges for comfort.  Controlling fevers with ibuprofen or acetaminophen as directed by your caregiver.  Increasing usage of your inhaler if you have asthma. Zinc gel and zinc lozenges, taken in the first 24 hours of the common cold, can shorten the duration and lessen the severity of symptoms. Pain medicines may help with fever, muscle aches, and throat pain. A variety of non-prescription medicines are available to treat congestion and runny nose. Your caregiver can make recommendations and may suggest nasal or lung inhalers for other symptoms.  HOME CARE INSTRUCTIONS   Only take over-the-counter or prescription medicines for pain, discomfort, or fever as directed by your caregiver.  Use a warm mist humidifier or inhale steam from a shower to increase air moisture. This may keep secretions moist and make it easier to breathe.  Drink enough water and fluids to keep your urine clear or pale  yellow.  Rest as needed.  Return to work when your temperature has returned to normal or as your caregiver advises. You may need to stay home longer to avoid infecting others. You can also use a face mask and careful hand washing to prevent spread of the virus. SEEK MEDICAL CARE IF:   After the first few days, you feel you are getting worse rather than better.  You need your caregiver's advice about medicines to control symptoms.  You develop chills, worsening shortness of breath, or brown or red sputum. These may be signs of  pneumonia.  You develop yellow or brown nasal discharge or pain in the face, especially when you bend forward. These may be signs of sinusitis.  You develop a fever, swollen neck glands, pain with swallowing, or white areas in the back of your throat. These may be signs of strep throat. SEEK IMMEDIATE MEDICAL CARE IF:   You have a fever.  You develop severe or persistent headache, ear pain, sinus pain, or chest pain.  You develop wheezing, a prolonged cough, cough up blood, or have a change in your usual mucus (if you have chronic lung disease).  You develop sore muscles or a stiff neck. Document Released: 10/13/2000 Document Revised: 07/12/2011 Document Reviewed: 08/21/2010 [facility] Patient Information 2014 [place], [place].

## 2013-07-19 NOTE — ED Notes (Signed)
Blood to be drawn now.

## 2013-07-19 NOTE — ED Notes (Signed)
Pt c/o generalized body aches x's 1 week.  C/o pain in upper back.  Productive cough (yellow).  Denies nausea, vomiting or diarrhea

## 2013-07-19 NOTE — ED Provider Notes (Signed)
CSN: [id number]     Arrival date & time 07/19/13  1717 History   First MD Initiated Contact with Patient 07/19/13 2258     Chief Complaint  Patient presents with  . Generalized Body Aches     (Consider location/radiation/quality/duration/timing/severity/associated sxs/prior Treatment) HPI 58 year old female presents to emergency department with complaint of one week of feeling ill.  She reports generalized myalgias, cough, sinus pressure, fullness in ears.  She has had 3 days of low fever, chills.  She has had cough, productive of yellow sputum.  She has history of COPD.  Patient, reports she's been extremely fatigued, and sleeping most of the time.  She denies any nausea, vomiting, or diarrhea.  No sick contacts.  Unknown.  Flu shot this year.  She has been taking Aleve with only minimal improvement. Past Medical History  Diagnosis Date  . Hypertension   . Obstructive sleep apnea     compliant with cpap  . COPD (chronic obstructive pulmonary disease)   . GERD (gastroesophageal reflux disease)     takes tums occ  . Constipation   . Chronic bronchitis   . Type II diabetes mellitus   . Chronic lower back pain   . Chronic renal insufficiency    Past Surgical History  Procedure Laterality Date  . Cesarean section  1970  . Breast biopsy  12/06/2011    Procedure: BREAST BIOPSY WITH NEEDLE LOCALIZATION;  Surgeon: Rolm Bookbinder, MD;  Location: [facility] OR;  Service: General;  Laterality: Left;   Family History  Problem Relation Age of Onset  . Stomach cancer Mother 62  . Hypertension Mother   . Hyperlipidemia Mother   . Diabetes Mother   . Cancer Mother     stomach  . Colon cancer Neg Hx   . Hypertension Father   . Diabetes Father   . Heart attack Father   . Asthma Sister    History  Substance Use Topics  . Smoking status: Former Smoker -- 0.50 packs/day for 38 years    Types: Cigarettes    Quit date: 09/01/2012  . Smokeless tobacco: Never Used  . Alcohol Use: No   OB  History   Grav Para Term Preterm Abortions TAB SAB Ect Mult Living                 Review of Systems  See History of Present Illness; otherwise all other systems are reviewed and negative   Allergies  Review of patient's allergies indicates no known allergies.  Home Medications   Current Outpatient Rx  Name  Route  Sig  Dispense  Refill  . albuterol (PROAIR HFA) 108 (90 BASE) MCG/ACT inhaler   Inhalation   Inhale 2 puffs into the lungs every 6 (six) hours as needed for wheezing.         Marland Kitchen amLODipine (NORVASC) 10 MG tablet   Oral   Take 10 mg by mouth daily.         Marland Kitchen atorvastatin (LIPITOR) 10 MG tablet   Oral   Take 10 mg by mouth every evening.          . budesonide-formoterol (SYMBICORT) 160-4.5 MCG/ACT inhaler   Inhalation   Inhale 2 puffs into the lungs 2 (two) times daily.         . hydrochlorothiazide (HYDRODIURIL) 25 MG tablet   Oral   Take 25 mg by mouth daily.         . pantoprazole (PROTONIX) 40 MG tablet   Oral  Take 40 mg by mouth daily.         . valsartan (DIOVAN) 40 MG tablet   Oral   Take 40 mg by mouth daily.          BP 112/66  Pulse 78  Temp(Src) 99.3 F (37.4 C) (Oral)  Resp 20  Ht 5\' 3"  (1.6 m)  Wt 198 lb 1 oz (89.841 kg)  BMI 35.09 kg/m2  SpO2 98% Physical Exam  Nursing note and vitals reviewed. Constitutional: She is oriented to person, place, and time. She appears well-developed and well-nourished.  HENT:  Head: Normocephalic and atraumatic.  Nose: Nose normal.  Mouth/Throat: Oropharynx is clear and moist.  Patient has serous fluidnoted behind both ears without erythema or bulging of the eardrum  Eyes: Conjunctivae and EOM are normal. Pupils are equal, round, and reactive to light.  Neck: Normal range of motion. Neck supple. No JVD present. No tracheal deviation present. No thyromegaly present.  Cardiovascular: Normal rate, regular rhythm, normal heart sounds and intact distal pulses.  Exam reveals no gallop and no  friction rub.   No murmur heard. Pulmonary/Chest: Effort normal. No stridor. No respiratory distress. She has wheezes. She has no rales. She exhibits no tenderness.  Cough, adventitial lung sounds  Abdominal: Soft. Bowel sounds are normal. She exhibits no distension and no mass. There is no tenderness. There is no rebound and no guarding.  Musculoskeletal: Normal range of motion. She exhibits no edema and no tenderness.  Lymphadenopathy:    She has no cervical adenopathy.  Neurological: She is alert and oriented to person, place, and time. She exhibits normal muscle tone. Coordination normal.  Skin: Skin is warm and dry. No rash noted. No erythema. No pallor.  Psychiatric: She has a normal mood and affect. Her behavior is normal. Judgment and thought content normal.    ED Course  Procedures (including critical care time) Labs Review Labs Reviewed  CBC WITH DIFFERENTIAL - Abnormal; Notable for the following:    RBC 5.34 (*)    All other components within normal limits  COMPREHENSIVE METABOLIC PANEL - Abnormal; Notable for the following:    Sodium 136 (*)    Potassium 3.6 (*)    Chloride 93 (*)    Glucose, Bld 111 (*)    BUN 24 (*)    Creatinine, Ser 1.57 (*)    GFR calc non Af Amer 36 (*)    GFR calc Af Amer 41 (*)    All other components within normal limits   Imaging Review Dg Chest 2 View  07/19/2013   CLINICAL DATA:  Generalized body aches, cough, history hypertension, smoking, sleep apnea, diabetes  EXAM: CHEST  2 VIEW  COMPARISON:  10/03/2012  FINDINGS: Normal heart size, mediastinal contours, and pulmonary vascularity.  Minimal peribronchial thickening.  Lungs otherwise clear.  No pleural effusion or pneumothorax.  Bones unremarkable.  IMPRESSION: No acute abnormalities.   Electronically Signed   By: Lavonia Dana M.D.   On: 07/19/2013 18:27     EKG Interpretation None      MDM   Final diagnoses:  Bronchitis  URI (upper respiratory infection)    58 year old female  with history of COPD, with bronchitic-type cough, wheezing.  Suspect viral syndrome given sinusitis and fluid in the ears.  Given history of COPD, will place patient on Zithromax and have her followup with her primary care Dr.  Mild elevation in Cr from baseline, pt instructed to increase fluids   Kalman Drape, MD  07/19/13 2336 

## 2013-07-25 ENCOUNTER — Encounter: Payer: Self-pay | Admitting: Cardiology

## 2013-07-25 ENCOUNTER — Ambulatory Visit (INDEPENDENT_AMBULATORY_CARE_PROVIDER_SITE_OTHER): Payer: Medicaid Other | Admitting: Cardiology

## 2013-07-25 VITALS — BP 101/66 | HR 80 | Ht 65.0 in | Wt 195.0 lb

## 2013-07-25 DIAGNOSIS — I498 Other specified cardiac arrhythmias: Secondary | ICD-10-CM

## 2013-07-25 DIAGNOSIS — E669 Obesity, unspecified: Secondary | ICD-10-CM

## 2013-07-25 DIAGNOSIS — R001 Bradycardia, unspecified: Secondary | ICD-10-CM

## 2013-07-25 DIAGNOSIS — I10 Essential (primary) hypertension: Secondary | ICD-10-CM

## 2013-07-25 DIAGNOSIS — G4733 Obstructive sleep apnea (adult) (pediatric): Secondary | ICD-10-CM | POA: Insufficient documentation

## 2013-07-25 NOTE — Patient Instructions (Signed)
Your physician recommends that you continue on your current medications as directed. Please refer to the Current Medication list given to you today.  Your physician wants you to follow-up in: 6 months with Dr Turner You will receive a reminder letter in the mail two months in advance. If you don't receive a letter, please call our office to schedule the follow-up appointment.  

## 2013-07-25 NOTE — Progress Notes (Signed)
[place], [address] [place], [place]  [address] Phone: [phone] Fax:  [phone]  Date:  07/25/2013   ID:  [name], DOB [birth year], MRN [id number]  PCP:  Leamon Arnt, MD  Cardiologist:  Fransico Him, MD     History of Present Illness: Robin Clayton is a 58 y.o. female with a history of bradycardia, OSA on CPAP, obesity and HTN who presents today for followup.  She is doing well.  She tolerates her CPAP without difficulty but does not like it and is not using it much.  She does not like the full face mask but feels the pressure is adequate.  She feels rested in the am and has no daytime sleepiness.  She denies any chest pain, SOB, DOE, LE edema, dizziness, palpitations or syncope.   Wt Readings from Last 3 Encounters:  07/19/13 198 lb 1 oz (89.841 kg)  07/10/13 207 lb 8 oz (94.121 kg)  03/05/13 217 lb (98.431 kg)     Past Medical History  Diagnosis Date  . Hypertension   . Obstructive sleep apnea     compliant with cpap  . COPD (chronic obstructive pulmonary disease)   . GERD (gastroesophageal reflux disease)     takes tums occ  . Constipation   . Chronic bronchitis   . Type II diabetes mellitus   . Chronic lower back pain   . Chronic renal insufficiency     Current Outpatient Prescriptions  Medication Sig Dispense Refill  . albuterol (PROAIR HFA) 108 (90 BASE) MCG/ACT inhaler Inhale 2 puffs into the lungs every 6 (six) hours as needed for wheezing.      Marland Kitchen amLODipine (NORVASC) 10 MG tablet Take 10 mg by mouth daily.      Marland Kitchen atorvastatin (LIPITOR) 10 MG tablet Take 10 mg by mouth every evening.       Marland Kitchen azithromycin (ZITHROMAX) 250 MG tablet Take 1 tablet (250 mg total) by mouth daily.  4 tablet  0  . budesonide-formoterol (SYMBICORT) 160-4.5 MCG/ACT inhaler Inhale 2 puffs into the lungs 2 (two) times daily.      Marland Kitchen dextromethorphan-guaiFENesin (MUCINEX DM) 30-600 MG per 12 hr tablet Take 1 tablet by mouth 2 (two) times daily as needed for cough.  30 tablet  0    . hydrochlorothiazide (HYDRODIURIL) 25 MG tablet Take 25 mg by mouth daily.      . pantoprazole (PROTONIX) 40 MG tablet Take 40 mg by mouth daily.      . valsartan (DIOVAN) 40 MG tablet Take 40 mg by mouth daily.       No current facility-administered medications for this visit.    Allergies:   No Known Allergies  Social History:  The patient  reports that she quit smoking about 10 months ago. Her smoking use included Cigarettes. She has a 19 pack-year smoking history. She has never used smokeless tobacco. She reports that she does not drink alcohol or use illicit drugs.   Family History:  The patient's family history includes Asthma in her sister; Cancer in her mother; Diabetes in her father and mother; Heart attack in her father; Hyperlipidemia in her mother; Hypertension in her father and mother; Stomach cancer (age of onset: 63) in her mother. There is no history of Colon cancer.   ROS:  Please see the history of present illness.      All other systems reviewed and negative.   PHYSICAL EXAM: VS:  There were no vitals taken for this visit. Well nourished,  well developed, in no acute distress HEENT: normal Neck: no JVD Cardiac:  normal S1, S2; RRR; no murmur Lungs:  clear to auscultation bilaterally, no wheezing, rhonchi or rales Abd: soft, nontender, no hepatomegaly Ext: no edema Skin: warm and dry Neuro:  CNs 2-12 intact, no focal abnormalities noted      ASSESSMENT AND PLAN:  1. OSA on CPAP and tolerating well - I will order her a ResMed Airfit P10 mask with chin strap and then get a CPAP d/l from the DME 2 weeks later 2. HTN - well controlled - continue amlodipine/HCTZ/Diovan 3. Obesity - I congratulated her on losing weight and encouraged her to try to get into a routine exercise program 4.   Asymptomatic bradycardia - resolved  Followup with me in 6 months  Signed, Fransico Him, MD 07/25/2013 2:27 PM

## 2013-07-27 ENCOUNTER — Other Ambulatory Visit: Payer: Self-pay | Admitting: Internal Medicine

## 2013-08-13 ENCOUNTER — Encounter: Payer: Self-pay | Admitting: Internal Medicine

## 2013-08-13 ENCOUNTER — Telehealth: Payer: Self-pay | Admitting: Internal Medicine

## 2013-08-13 NOTE — Telephone Encounter (Signed)
Per MW- ONO 2lpm was normal  Needs to continue o2  Spoke with pt and notified of results per Dr. Melvyn Novas. Pt verbalized understanding and denied any questions.

## 2013-08-23 ENCOUNTER — Other Ambulatory Visit: Payer: Self-pay | Admitting: Internal Medicine

## 2013-09-23 ENCOUNTER — Other Ambulatory Visit: Payer: Self-pay | Admitting: Internal Medicine

## 2013-09-28 ENCOUNTER — Telehealth: Payer: Self-pay | Admitting: Cardiology

## 2013-09-28 NOTE — Telephone Encounter (Signed)
Spoke with Robin Clayton and she will refax forms over to be signed.  

## 2013-09-28 NOTE — Telephone Encounter (Signed)
Following up ° ° ° °Robin Clayton with [facility] called to find out the status of the billing paper work on this pt. May 8th they were fax to us.   Please give her a call with any question or fax [phone] °

## 2013-10-01 ENCOUNTER — Other Ambulatory Visit: Payer: Self-pay

## 2013-10-01 DIAGNOSIS — Z1231 Encounter for screening mammogram for malignant neoplasm of breast: Secondary | ICD-10-CM

## 2013-10-05 ENCOUNTER — Ambulatory Visit
Admission: RE | Admit: 2013-10-05 | Discharge: 2013-10-05 | Disposition: A | Discharge status: Home / Self Care | Payer: Medicaid Other | Source: Ambulatory Visit

## 2013-10-05 ENCOUNTER — Encounter (INDEPENDENT_AMBULATORY_CARE_PROVIDER_SITE_OTHER): Payer: Self-pay

## 2013-10-05 DIAGNOSIS — Z1231 Encounter for screening mammogram for malignant neoplasm of breast: Secondary | ICD-10-CM

## 2014-01-23 ENCOUNTER — Ambulatory Visit (INDEPENDENT_AMBULATORY_CARE_PROVIDER_SITE_OTHER): Payer: Medicaid Other | Admitting: Cardiology

## 2014-01-23 ENCOUNTER — Encounter: Payer: Self-pay | Admitting: Cardiology

## 2014-01-23 VITALS — BP 100/67 | HR 66 | Ht 63.0 in | Wt 204.6 lb

## 2014-01-23 DIAGNOSIS — G4733 Obstructive sleep apnea (adult) (pediatric): Secondary | ICD-10-CM

## 2014-01-23 DIAGNOSIS — I10 Essential (primary) hypertension: Secondary | ICD-10-CM

## 2014-01-23 DIAGNOSIS — E669 Obesity, unspecified: Secondary | ICD-10-CM

## 2014-01-23 NOTE — Progress Notes (Signed)
[place], [address] [place], [place]  [address] Phone: [phone] Fax:  [phone]  Date:  01/23/2014   ID:  Robin Clayton, DOB 1955-06-06, MRN [id number]  PCP:  Leamon Arnt, MD  Cardiologist:  Fransico Him, MD    History of Present Illness: Robin Clayton is a 58 y.o. female with a history of bradycardia, OSA on CPAP, obesity and HTN who presents today for followup. She is doing well. She tolerates her CPAP without difficulty.  When I saw her last she complained that she did not like her full face mask so I changed her to a nasal pillow mask but she did not like that and she is now back to the full face mask which she it tolerating.  She occasionally has some problems with dry mouth.   She feels rested in the am but occasionally takes a nap. She denies any chest pain, SOB, DOE, LE edema, dizziness, palpitations or syncope.    Wt Readings from Last 3 Encounters:  01/23/14 204 lb 9.6 oz (92.806 kg)  07/25/13 195 lb (88.451 kg)  07/19/13 198 lb 1 oz (89.841 kg)     Past Medical History  Diagnosis Date  . Hypertension   . Obstructive sleep apnea     compliant with cpap  . COPD (chronic obstructive pulmonary disease)   . GERD (gastroesophageal reflux disease)     takes tums occ  . Constipation   . Chronic bronchitis   . Type II diabetes mellitus   . Chronic lower back pain   . Chronic renal insufficiency     Current Outpatient Prescriptions  Medication Sig Dispense Refill  . cloNIDine (CATAPRES) 0.2 MG tablet Take 0.2 mg by mouth daily. Per pt. She did not have med bottle with her      . albuterol (PROAIR HFA) 108 (90 BASE) MCG/ACT inhaler Inhale 2 puffs into the lungs every 6 (six) hours as needed for wheezing.      Marland Kitchen amLODipine (NORVASC) 10 MG tablet Take 10 mg by mouth daily.      Marland Kitchen atorvastatin (LIPITOR) 10 MG tablet Take 10 mg by mouth every evening.       . budesonide-formoterol (SYMBICORT) 160-4.5 MCG/ACT inhaler Inhale 2 puffs into the lungs 2 (two) times daily.       . hydrochlorothiazide (HYDRODIURIL) 25 MG tablet Take 25 mg by mouth daily.      . pantoprazole (PROTONIX) 40 MG tablet Take 40 mg by mouth daily.      . valsartan (DIOVAN) 80 MG tablet TAKE 1 TABLET BY MOUTH EVERY DAY  30 tablet  0   No current facility-administered medications for this visit.    Allergies:   No Known Allergies  Social History:  The patient  reports that she quit smoking about 16 months ago. Her smoking use included Cigarettes. She has a 19 pack-year smoking history. She has never used smokeless tobacco. She reports that she does not drink alcohol or use illicit drugs.   Family History:  The patient's family history includes Asthma in her sister; Cancer in her mother; Diabetes in her father and mother; Heart attack in her father; Hyperlipidemia in her mother; Hypertension in her father and mother; Stomach cancer (age of onset: 38) in her mother. There is no history of Colon cancer.   ROS:  Please see the history of present illness.      All other systems reviewed and negative.   PHYSICAL EXAM: VS:  BP 100/67  Pulse 66  Ht 5\' 3"  (1.6 m)  Wt 204 lb 9.6 oz (92.806 kg)  BMI 36.25 kg/m2 Well nourished, well developed, in no acute distress HEENT: normal Neck: no JVD Cardiac:  normal S1, S2; RRR; no murmur Lungs:  clear to auscultation bilaterally, no wheezing, rhonchi or rales Abd: soft, nontender, no hepatomegaly Ext: no edema Skin: warm and dry Neuro:  CNs 2-12 intact, no focal abnormalities noted     ASSESSMENT AND PLAN:  1. OSA on CPAP and tolerating well - I will get a d/l from her DME - I recommended that she take her device to her DME to get guidance on the humidifier dials to help with dry mouth 2. HTN - well controlled - continue amlodipine/HCTZ/Diovan/Clonidine 3. Obesity - I congratulated her on losing weight and encouraged her to try to get into a routine exercise program       4. Asymptomatic bradycardia - resolved   Followup with me in 6  months    Signed, Fransico Him, MD [facility] 01/23/2014 1:43 PM

## 2014-01-23 NOTE — Patient Instructions (Signed)
Continue current CPAP/BiPAP Settings. (Bring your download card to [facility] this week and have them fax [place] the results)  Your physician wants you to follow-up in: 6 months with Dr Mallie Snooks will receive a reminder letter in the mail two months in advance. If you don't receive a letter, please call our office to schedule the follow-up appointment.

## 2014-03-23 ENCOUNTER — Other Ambulatory Visit: Payer: Self-pay | Admitting: Internal Medicine

## 2014-06-19 ENCOUNTER — Other Ambulatory Visit: Payer: Self-pay | Admitting: Internal Medicine

## 2014-07-23 ENCOUNTER — Ambulatory Visit: Payer: Medicaid Other | Admitting: Cardiology

## 2014-07-23 NOTE — Progress Notes (Signed)
Cardiology Office Note   Date:  07/24/2014   ID:  Robin Clayton, DOB [birth year], MRN [id number]  PCP:  Robin Arnt, MD  Cardiologist/Sleep Medicine:   Robin Margarita, MD   Chief Complaint  Patient presents with  . Sleep Apnea  . Hypertension  . Obesity      History of Present Illness: Robin Clayton is a 59 y.o. female with a history of bradycardia, OSA on CPAP, obesity and HTN who presents today for followup. She is doing well. She is no longer wearing the CPAP.  She says it is too constraining.  She has tried the full face mask, nasal mask and nasal pillow mask and does not tolerate any of them.  She feels rested in the am but occasionally takes a nap. She denies any chest pain, SOB, DOE, dizziness, palpitations or syncope.  She occasionally has some LE edema but she sits a lot during the day.     Past Medical History  Diagnosis Date  . Hypertension   . Obstructive sleep apnea     compliant with cpap  . COPD (chronic obstructive pulmonary disease)   . GERD (gastroesophageal reflux disease)     takes tums occ  . Constipation   . Chronic bronchitis   . Type II diabetes mellitus   . Chronic lower back pain   . Chronic renal insufficiency     Past Surgical History  Procedure Laterality Date  . Cesarean section  1970  . Breast biopsy  12/06/2011    Procedure: BREAST BIOPSY WITH NEEDLE LOCALIZATION;  Surgeon: Robin Bookbinder, MD;  Location: [facility];  Service: General;  Laterality: Left;     Current Outpatient Prescriptions  Medication Sig Dispense Refill  . albuterol (PROAIR HFA) 108 (90 BASE) MCG/ACT inhaler Inhale 2 puffs into the lungs every 6 (six) hours as needed for wheezing.    Marland Kitchen albuterol (PROVENTIL) (2.5 MG/3ML) 0.083% nebulizer solution   3  . amLODipine (NORVASC) 10 MG tablet Take 10 mg by mouth daily.    Marland Kitchen atorvastatin (LIPITOR) 10 MG tablet Take 10 mg by mouth every evening.     . budesonide-formoterol (SYMBICORT) 160-4.5 MCG/ACT inhaler Inhale 2 puffs  into the lungs 2 (two) times daily.    . cloNIDine (CATAPRES) 0.2 MG tablet Take 0.2 mg by mouth daily. Per pt. She did not have med bottle with her    . hydrochlorothiazide (HYDRODIURIL) 25 MG tablet Take 25 mg by mouth daily.    . pantoprazole (PROTONIX) 40 MG tablet Take 40 mg by mouth daily.    . pantoprazole (PROTONIX) 40 MG tablet TAKE 1 TABLET BY MOUTH EVERY DAY 30 TO 60 MINUTES BEFORE A MEAL 90 tablet 0  . valsartan (DIOVAN) 80 MG tablet TAKE 1 TABLET BY MOUTH EVERY DAY 30 tablet 0   No current facility-administered medications for this visit.    Allergies:   Review of patient's allergies indicates no known allergies.    Social History:  The patient  reports that she quit smoking about 22 months ago. Her smoking use included Cigarettes. She has a 19 pack-year smoking history. She has never used smokeless tobacco. She reports that she does not drink alcohol or use illicit drugs.   Family History:  The patient's family history includes Asthma in her sister; Cancer in her mother; Diabetes in her father and mother; Heart attack in her father; Hyperlipidemia in her mother; Hypertension in her father and mother; Stomach cancer (age of onset: 22) in her mother.  There is no history of Colon cancer.    ROS:  Please see the history of present illness.   Otherwise, review of systems are positive for none.   All other systems are reviewed and negative.    PHYSICAL EXAM: VS:  BP 100/60 mmHg  Pulse 72  Ht 5\' 3"  (1.6 m)  Wt 213 lb 12.8 oz (96.979 kg)  BMI 37.88 kg/m2 , BMI Body mass index is 37.88 kg/(m^2). GEN: Well nourished, well developed, in no acute distress HEENT: normal Neck: no JVD, carotid bruits, or masses Cardiac: RRR; no murmurs, rubs, or gallops,no edema  Respiratory:  clear to auscultation bilaterally, normal work of breathing GI: soft, nontender, nondistended, + BS MS: no deformity or atrophy Skin: warm and dry, no rash Neuro:  Strength and sensation are intact Psych:  euthymic mood, full affect   EKG:  EKG is not ordered today.    Recent Labs: No results found for requested labs within last 365 days.    Lipid Panel    Component Value Date/Time   CHOL  01/05/2010 0355    184        ATP III CLASSIFICATION:  <200     mg/dL   Desirable  200-239  mg/dL   Borderline High  >=240    mg/dL   High          TRIG 62 01/05/2010 0355   HDL 57 01/05/2010 0355   CHOLHDL 3.2 01/05/2010 0355   VLDL 12 01/05/2010 0355   LDLCALC * 01/05/2010 0355    115        Total Cholesterol/HDL:CHD Risk Coronary Heart Disease Risk Table                     Men   Women  1/2 Average Risk   3.4   3.3  Average Risk       5.0   4.4  2 X Average Risk   9.6   7.1  3 X Average Risk  23.4   11.0        Use the calculated Patient Ratio above and the CHD Risk Table to determine the patient's CHD Risk.        ATP III CLASSIFICATION (LDL):  <100     mg/dL   Optimal  100-129  mg/dL   Near or Above                    Optimal  130-159  mg/dL   Borderline  160-189  mg/dL   High  >190     mg/dL   Very High      Wt Readings from Last 3 Encounters:  07/24/14 213 lb 12.8 oz (96.979 kg)  01/23/14 204 lb 9.6 oz (92.806 kg)  07/25/13 195 lb (88.451 kg)       ASSESSMENT AND PLAN:  1. OSA on CPAP - she has stopped using the device due to intolerance to the mask.  She has tried all 3 types of masks and says she cannot wear them.  She had moderate OSA when she had her sleep study and therefore CPAP would be the first line therapy but she cannot tolerate it the next step would be an oral device.   - I will refer her to dentistry for an oral device.   2. HTN - well controlled - continue amlodipine/HCTZ/Diovan/Clonidine - check BMET      3.  Obesity - She has gained 6 lbs since I saw  her last.  I encouraged her to try to get into a routine exercise program and watch her diet  4. Asymptomatic bradycardia - resolved   Current medicines are reviewed at length with the patient  today.  The patient does not have concerns regarding medicines.  The following changes have been made:  no change  Labs/ tests ordered today include: BMET  No orders of the defined types were placed in this encounter.     Disposition:   FU with me in 6 months   Signed, Robin Margarita, MD  07/24/2014 9:43 AM    [facility] [address], [place], [place]  [address] Phone: [phone]; Fax: [phone]

## 2014-07-24 ENCOUNTER — Ambulatory Visit (INDEPENDENT_AMBULATORY_CARE_PROVIDER_SITE_OTHER): Payer: Medicaid Other | Admitting: Cardiology

## 2014-07-24 ENCOUNTER — Encounter: Payer: Self-pay | Admitting: Cardiology

## 2014-07-24 VITALS — BP 100/60 | HR 72 | Ht 63.0 in | Wt 213.8 lb

## 2014-07-24 DIAGNOSIS — R001 Bradycardia, unspecified: Secondary | ICD-10-CM

## 2014-07-24 DIAGNOSIS — G4733 Obstructive sleep apnea (adult) (pediatric): Secondary | ICD-10-CM | POA: Diagnosis not present

## 2014-07-24 DIAGNOSIS — E669 Obesity, unspecified: Secondary | ICD-10-CM | POA: Diagnosis not present

## 2014-07-24 DIAGNOSIS — I10 Essential (primary) hypertension: Secondary | ICD-10-CM

## 2014-07-24 LAB — BASIC METABOLIC PANEL
BUN: 17 mg/dL (ref 6–23)
CO2: 28 meq/L (ref 19–32)
Calcium: 9.4 mg/dL (ref 8.4–10.5)
Chloride: 103 mEq/L (ref 96–112)
Creatinine, Ser: 1.3 mg/dL — ABNORMAL HIGH (ref 0.40–1.20)
GFR: 54.21 mL/min — AB (ref >60.00)
GLUCOSE: 138 mg/dL — AB (ref 70–99)
Potassium: 3.8 mEq/L (ref 3.5–5.1)
SODIUM: 139 meq/L (ref 135–145)

## 2014-07-24 NOTE — Patient Instructions (Signed)
You will be hearing from Dr. Corky Sing office soon about an oral device.   Your physician recommends that you have lab work TODAY (BMET).  Your physician wants you to follow-up in: 6 months with Dr. Radford Pax. You will receive a reminder letter in the mail two months in advance. If you don't receive a letter, please call our office to schedule the follow-up appointment.

## 2014-09-21 ENCOUNTER — Other Ambulatory Visit: Payer: Self-pay | Admitting: Internal Medicine

## 2014-10-16 ENCOUNTER — Other Ambulatory Visit: Payer: Self-pay | Admitting: Family Medicine

## 2014-10-16 DIAGNOSIS — Z1231 Encounter for screening mammogram for malignant neoplasm of breast: Secondary | ICD-10-CM

## 2014-10-22 ENCOUNTER — Other Ambulatory Visit: Payer: Self-pay | Admitting: Internal Medicine

## 2014-10-28 ENCOUNTER — Ambulatory Visit
Admission: RE | Admit: 2014-10-28 | Discharge: 2014-10-28 | Disposition: A | Discharge status: Home / Self Care | Payer: Medicaid Other | Source: Ambulatory Visit | Attending: Family Medicine | Admitting: Family Medicine

## 2014-10-28 DIAGNOSIS — Z1231 Encounter for screening mammogram for malignant neoplasm of breast: Secondary | ICD-10-CM

## 2014-10-29 ENCOUNTER — Other Ambulatory Visit: Payer: Self-pay | Admitting: Family Medicine

## 2014-10-29 ENCOUNTER — Other Ambulatory Visit: Payer: Self-pay | Admitting: Internal Medicine

## 2014-10-29 DIAGNOSIS — R928 Other abnormal and inconclusive findings on diagnostic imaging of breast: Secondary | ICD-10-CM

## 2014-11-05 ENCOUNTER — Other Ambulatory Visit: Payer: Medicaid Other

## 2014-11-07 ENCOUNTER — Ambulatory Visit
Admission: RE | Admit: 2014-11-07 | Discharge: 2014-11-07 | Disposition: A | Discharge status: Home / Self Care | Payer: Medicaid Other | Source: Ambulatory Visit | Attending: Family Medicine | Admitting: Family Medicine

## 2014-11-07 ENCOUNTER — Encounter: Payer: Self-pay | Admitting: Cardiology

## 2014-11-07 DIAGNOSIS — R928 Other abnormal and inconclusive findings on diagnostic imaging of breast: Secondary | ICD-10-CM

## 2014-12-20 ENCOUNTER — Other Ambulatory Visit: Payer: Self-pay | Admitting: Internal Medicine

## 2014-12-25 ENCOUNTER — Telehealth: Payer: Self-pay | Admitting: Internal Medicine

## 2014-12-25 MED ORDER — PANTOPRAZOLE SODIUM 40 MG PO TBEC: DELAYED_RELEASE_TABLET | ORAL | Status: DC

## 2014-12-25 NOTE — Telephone Encounter (Signed)
Spoke with pt and advised that refill was sent and to keep f/u appt with Dr Melvyn Novas.  Pt verbalized understanding.

## 2014-12-25 NOTE — Telephone Encounter (Signed)
Refill sent for Pantoprazole. [place] x 1 - Pt needs to keep f/u appt for further refills

## 2014-12-28 IMAGING — CR DG CHEST 2V
2 series · 2 of 2 positions shown · non-contrast
Comparison: Chest x-ray of 04/09/2011

CLINICAL DATA: COPD, some chest pain and shortness of breath,
smoking history

CHEST - 2 VIEW

[view not recorded (1 of 2)]
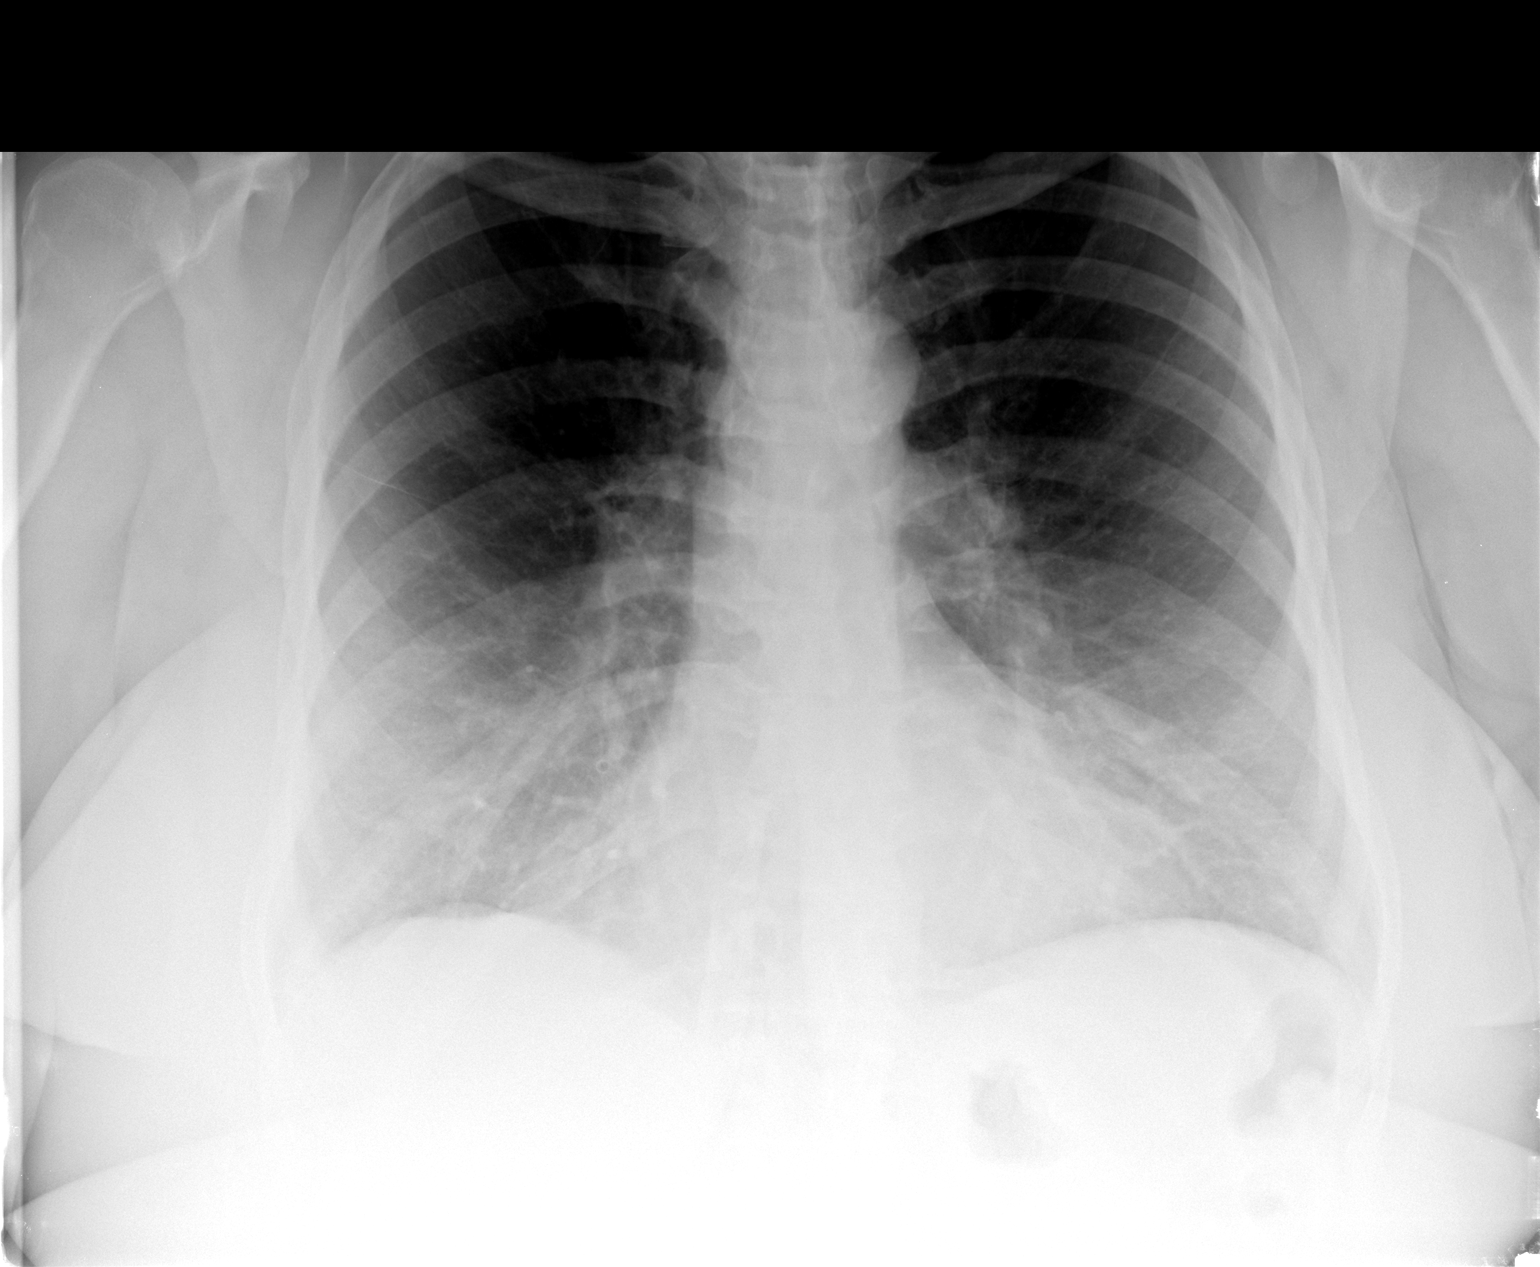

[view not recorded (2 of 2)]
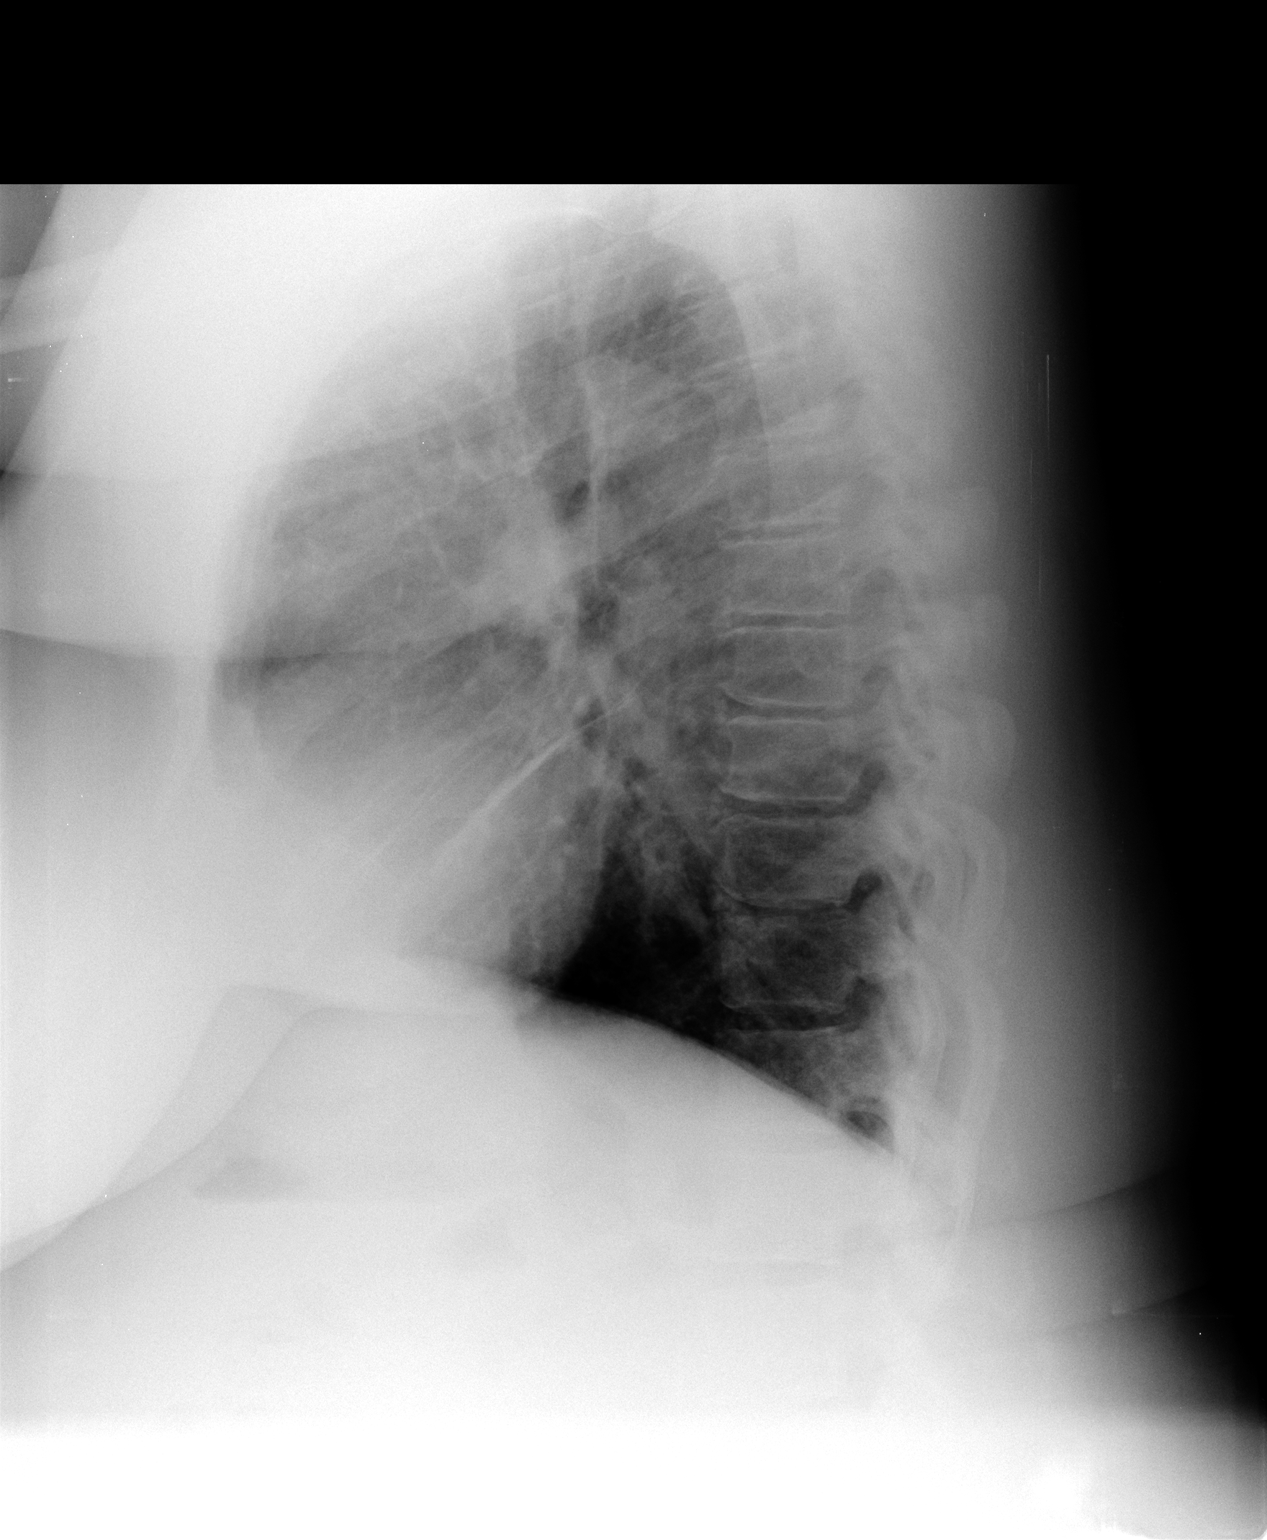

[2 of 2 positions shown; findings below may reference images not displayed]

FINDINGS: Chronic changes are present with somewhat prominent
markings in the perihilar region and in the lung bases consistent
with scarring and probable bronchitis.  No active infiltrate or
effusion is seen.  The heart is within upper limits of normal.
There are degenerative changes in the thoracic spine.
IMPRESSION: Chronic changes consistent with bronchitis and scarring.  No active
lung disease.

## 2015-01-03 ENCOUNTER — Encounter: Payer: Self-pay | Admitting: Internal Medicine

## 2015-01-03 ENCOUNTER — Ambulatory Visit (INDEPENDENT_AMBULATORY_CARE_PROVIDER_SITE_OTHER): Payer: Medicaid Other | Admitting: Internal Medicine

## 2015-01-03 VITALS — BP 114/76 | HR 77 | Ht 63.0 in | Wt 204.0 lb

## 2015-01-03 DIAGNOSIS — E669 Obesity, unspecified: Secondary | ICD-10-CM | POA: Diagnosis not present

## 2015-01-03 DIAGNOSIS — G4734 Idiopathic sleep related nonobstructive alveolar hypoventilation: Secondary | ICD-10-CM

## 2015-01-03 DIAGNOSIS — J449 Chronic obstructive pulmonary disease, unspecified: Secondary | ICD-10-CM | POA: Diagnosis not present

## 2015-01-03 MED ORDER — BUDESONIDE-FORMOTEROL FUMARATE 160-4.5 MCG/ACT IN AERO: INHALATION_SPRAY | RESPIRATORY_TRACT | Status: DC

## 2015-01-03 NOTE — Progress Notes (Addendum)
Subjective:    Patient ID: Robin Clayton, female    DOB: February 08, 1956  MRN: [id number]   Brief patient profile:  45 yobf  Quit smoking 08/2012   no problems until around 2000 referred to pulmonary clinic 05/08/2012 by Dr Billey Chang with baseline wt 150 but freq steroids required for flares of ab  to wt 253 at first pulmonary clinic ov and documented GOLD II copd 12/05/12    History of Present Illness  05/08/2012 1st ov/ Sallye Lunz/ cc 10 years of intermittent cough and sob responsive to short courses of prednisone progressively worse indolent onset doe even in absence of cough to point where sob across the parking lot then developed cp comes goes x 2 months lasting up to a half a day unless takes vicodin. Location is ant, generalized, assoc with overt HB, non-radiating, no worse walking. Coughing does not make it worse. rec Continue symbicort 160 Take 2 puffs first thing in am and then another 2 puffs about 12 hours later.  Pantoprazole 40 mg Take 30-60 min before first meal of the day  GERD  diet Please schedule a follow up office visit in 4 weeks, sooner if needed with pft's on return > did not return  09/15/2012 f/u ov/Taneshia Lorence copd/ 02 dep at baseline/ now on ACEi but quit smoking effective 08/31/12 Chief Complaint  Patient presents with  . Follow-up    Increased DOE x 2 wks, and wheezing for the past 3 days.   sob does improve with neb for an hour or two or rest  But aslo  wakes at night with hoarsness and dry cough day > niight Indolent onset, progressively worse. rec Pepcid 20 ac one at bedtime Stop lisinopril and start micardis 40 mg one daily  Plan A = automatic = symbicort 2 puff followed by one of tudorza twice daily until return Plan B = Backup = proaire up to every 4 hours only  Plan C = Nebulizer every 4 hours Prednisone 10 mg take  4 each am x 2 days,   2 each am x 2 days,  1 each am x2days and stop        03/05/2013 f/u ov/Kamilya Wakeman re: COPD GOLD II Chief Complaint  Patient presents with   . Follow-up    Pt states that her breathing is doing well. She uses albuterol inhaler approx 3 times per wk on average- mainly in the am's.     She is confusing symbicort and saba and turns out she's just using just the symbicort occ in ams maybe 3 x weekly s limiting doe rec Ok to just use the symbicort 160 up to 2 puffs every 12 hours as needed for any respiratory problems but if you start needing the rescue inhaler then really need to be more consistent with use of symbicort (not as needed)    07/10/2013 f/u ov/Roshelle Traub re: GOLD II COPD using symbicort 4 x a month / has had 02 x years for prn hs use Chief Complaint  Patient presents with  . Follow-up    Pt here to recertify for 02 qhs.  SOB with exertion, no other complaints.   really Not limited by breathing from desired activities   rec Ok to just use the symbicort 160 up to 2 puffs every 12 hours as needed for any respiratory problems but if you start needing the rescue inhaler then really need to be more consistent with use of symbicort (not as needed) 2 puffs perfectly regularly every 12 hours  Please see patient coordinator before you leave today  to schedule overnight 02 sats room air > did not do   01/05/2015 f/u ov/Oakleigh Hesketh re: obesity/ GOLD II copd on symbicort 160 2bid and rare saba  Chief Complaint  Patient presents with  . Follow-up    Pt states her breathing is doing well and has not needed to use rescue inhaler or neb. She has o2 that she uses rarely.    Not limited by breathing from desired activities  But not very active   No obvious pattern to mild day to variabilty or assoc cough or  cp or chest tightness, subjective wheeze overt sinus or hb symptoms. No unusual exp hx or h/o childhood pna/ asthma or premature birth to his knowledge.   Sleeping ok without nocturnal  or early am exacerbation  of respiratory  c/o's or need for noct saba. Also denies any obvious fluctuation of symptoms with weather or environmental changes or  other aggravating or alleviating factors except as outlined above   Current Medications, Allergies, Past Medical History, Past Surgical History, Family History, and Social History were reviewed in [employer] record.  ROS  The following are not active complaints unless bolded sore throat, dysphagia, dental problems, itching, sneezing,  nasal congestion or excess/ purulent secretions, ear ache,   fever, chills, sweats, unintended wt loss, pleuritic or exertional cp, hemoptysis,  orthopnea pnd or leg swelling, presyncope, palpitations, heartburn, abdominal pain, anorexia, nausea, vomiting, diarrhea  or change in bowel or urinary habits, change in stools or urine, dysuria,hematuria,  rash, arthralgias, visual complaints, headache, numbness weakness or ataxia or problems with walking or coordination,  change in mood/affect or memory.            Objective:   Physical Exam  amb obese bf nad 12/05/2012          223 > 217 03/05/2013 > 207 07/10/2013 > 01/03/2015   204  Wt Readings from Last 3 Encounters:  09/15/12 245 lb (111.131 kg)  05/08/12 253 lb 3.2 oz (114.851 kg)  12/17/11 241 lb 4 oz (109.43 kg)      HEENT mild turbinate edema.  Oropharynx no thrush or excess pnd or cobblestoning.  No JVD or cervical adenopathy. Mild accessory muscle hypertrophy. Trachea midline, nl thryroid. Chest was min hyperinflated by percussion with diminished breath sounds and mild increased exp time without wheeze. Hoover sign positive at end inspiration. Regular rate and rhythm without murmur gallop or rub or increase P2 or edema.  Abd: no hsm, nl excursion. Ext warm without cyanosis or clubbing.          I personally reviewed images and agree with radiology impression as follows:  CXR:  07/19/2013 No acute abnormalities. .      Assessment & Plan:   Outpatient Encounter Prescriptions as of 01/03/2015  Medication Sig  . albuterol (PROAIR HFA) 108 (90 BASE) MCG/ACT inhaler Inhale 2 puffs  into the lungs every 6 (six) hours as needed for wheezing.  Marland Kitchen albuterol (PROVENTIL) (2.5 MG/3ML) 0.083% nebulizer solution Take 2.5 mg by nebulization every 4 (four) hours as needed.   Marland Kitchen amLODipine (NORVASC) 10 MG tablet Take 10 mg by mouth daily.  Marland Kitchen atorvastatin (LIPITOR) 10 MG tablet Take 10 mg by mouth every evening.   . budesonide-formoterol (SYMBICORT) 160-4.5 MCG/ACT inhaler Take 2 puffs first thing in am and then another 2 puffs about 12 hours later.  . cloNIDine (CATAPRES) 0.2 MG tablet Take 0.2 mg by mouth daily. Per  pt. She did not have med bottle with her  . hydrochlorothiazide (HYDRODIURIL) 25 MG tablet Take 25 mg by mouth daily.  . pantoprazole (PROTONIX) 40 MG tablet Take 40 mg by mouth daily.  . valsartan (DIOVAN) 80 MG tablet TAKE 1 TABLET BY MOUTH EVERY DAY  . [DISCONTINUED] budesonide-formoterol (SYMBICORT) 160-4.5 MCG/ACT inhaler Inhale 2 puffs into the lungs 2 (two) times daily.  . [DISCONTINUED] pantoprazole (PROTONIX) 40 MG tablet TAKE 1 TABLET BY MOUTH EVERY DAY 30 TO 60 MINUTES BEFORE A MEAL   No facility-administered encounter medications on file as of 01/03/2015.

## 2015-01-03 NOTE — Patient Instructions (Signed)
Continue symbicort 160 up to 2 pffs every 12 hours   Only use your albuterol as a rescue medication to be used if you can't catch your breath by resting or doing a relaxed purse lip breathing pattern.  - The less you use it, the better it will work when you need it. - Ok to use up to 2 puffs  every 4 hours if you must but call for immediate appointment if use goes up over your usual need - Don't leave home without it !!  (think of it like the spare tire for your car)   If you are satisfied with your treatment plan,  let your doctor know and he/she can either refill your medications or you can return here when your prescription runs out.     If in any way you are not 100% satisfied,  please tell us.  If 100% better, tell your friends!  Pulmonary follow up is as needed

## 2015-01-05 NOTE — Assessment & Plan Note (Addendum)
-   quit smoking around 08/31/12  - ACEi d/c    09/16/2012 due to cough - 12/05/2012 PFTs FEV1 1.22 (58%)  64 and no better p B2,  DLCO 59 and 74  She has moderate dz. / well controlled at this point despite suboptimal  hfa.  The proper method of use, as well as anticipated side effects, of a metered-dose inhaler are discussed and demonstrated to the patient. Improved effectiveness after extensive coaching during this visit to a level of approximately  75% from a basline of < 50% so ok to continue symbiocort 160 2bid  / no need for lama   I had an extended summary final discussion with the patient reviewing all relevant studies completed to date and  lasting 15 to 20 minutes of a 25 minute visit    I reviewed the Fletcher curve with the patient that basically indicates  if you quit smoking when your best day FEV1 is still well preserved (as is clearly  the case here)  it is highly unlikely you will progress to severe disease and informed the patient there was no medication on the market that has proven to alter the curve/ its downward trajectory  or the likelihood of progression of their disease.  Therefore  maintaining abstinence from smoking is the most important aspect of care, not choice of inhalers or for that matter, doctors, and the treatment is all focused on symptoms at this point  Each maintenance medication was reviewed in detail including most importantly the difference between maintenance and prns and under what circumstances the prns are to be triggered using an action plan format that is not reflected in the computer generated alphabetically organized AVS.    Please see instructions for details which were reviewed in writing and the patient given a copy highlighting the part that I personally wrote and discussed at today's ov.

## 2015-01-05 NOTE — Assessment & Plan Note (Signed)
-   on CPAP and 02 prn chronically but doesn't use either consistently as of 07/10/2013  - ono RA 07/10/2013 (would prefer to keep 02 if qualifies off cpap) :   desat < 89% x 30 m on RA 07/25/13 so rec continue noct 02

## 2015-01-20 ENCOUNTER — Other Ambulatory Visit: Payer: Self-pay | Admitting: Internal Medicine

## 2015-01-20 ENCOUNTER — Encounter: Payer: Self-pay | Admitting: Cardiology

## 2015-01-20 ENCOUNTER — Ambulatory Visit (INDEPENDENT_AMBULATORY_CARE_PROVIDER_SITE_OTHER): Payer: Medicaid Other | Admitting: Cardiology

## 2015-01-20 VITALS — BP 116/68 | HR 90 | Ht 63.5 in | Wt 206.4 lb

## 2015-01-20 DIAGNOSIS — E669 Obesity, unspecified: Secondary | ICD-10-CM

## 2015-01-20 DIAGNOSIS — R001 Bradycardia, unspecified: Secondary | ICD-10-CM

## 2015-01-20 DIAGNOSIS — I10 Essential (primary) hypertension: Secondary | ICD-10-CM | POA: Diagnosis not present

## 2015-01-20 DIAGNOSIS — G4733 Obstructive sleep apnea (adult) (pediatric): Secondary | ICD-10-CM | POA: Diagnosis not present

## 2015-01-20 LAB — BASIC METABOLIC PANEL
BUN: 21 mg/dL (ref 6–23)
CO2: 29 meq/L (ref 19–32)
Calcium: 9.1 mg/dL (ref 8.4–10.5)
Chloride: 106 mEq/L (ref 96–112)
Creatinine, Ser: 1.45 mg/dL — ABNORMAL HIGH (ref 0.40–1.20)
GFR: 47.71 mL/min — ABNORMAL LOW (ref >60.00)
Glucose, Bld: 148 mg/dL — ABNORMAL HIGH (ref 70–99)
Potassium: 3.8 mEq/L (ref 3.5–5.1)
SODIUM: 142 meq/L (ref 135–145)

## 2015-01-20 NOTE — Addendum Note (Signed)
Addended by: Harland German A on: 01/20/2015 05:45 PM   Modules accepted: Orders

## 2015-01-20 NOTE — Patient Instructions (Addendum)

## 2015-01-20 NOTE — Progress Notes (Signed)
Cardiology Office Note   Date:  01/20/2015   ID:  Robin Clayton, DOB 17-Apr-1956, MRN [id number]  PCP:  Leamon Arnt, MD    Chief Complaint  Patient presents with  . Sleep Apnea  . Hypertension      History of Present Illness: Robin Clayton is a 59 y.o. female with a history of bradycardia, OSA on CPAP, obesity and HTN who presents today for followup. She is doing well. She is no longer wearing the CPAP because the mask was too constraining. She has tried the full face mask, nasal mask and nasal pillow mask and did not tolerate any of them. She feels rested in the am but occasionally takes a nap. She went to see dentistry for oral device but insurance would not pay for it.  She denies any chest pain, dizziness or syncope.She has chronic SOB that is stable.  She has been noticing some fluttering in her chest on occasion.     Past Medical History  Diagnosis Date  . Hypertension   . Obstructive sleep apnea     moderate OSA with AHI 24/hr now intolerant to CPAP  . COPD (chronic obstructive pulmonary disease)   . GERD (gastroesophageal reflux disease)     takes tums occ  . Constipation   . Chronic bronchitis   . Type II diabetes mellitus   . Chronic lower back pain   . Chronic renal insufficiency     Past Surgical History  Procedure Laterality Date  . Cesarean section  1970  . Breast biopsy  12/06/2011    Procedure: BREAST BIOPSY WITH NEEDLE LOCALIZATION;  Surgeon: Rolm Bookbinder, MD;  Location: [facility];  Service: General;  Laterality: Left;     Current Outpatient Prescriptions  Medication Sig Dispense Refill  . albuterol (PROAIR HFA) 108 (90 BASE) MCG/ACT inhaler Inhale 2 puffs into the lungs every 6 (six) hours as needed for wheezing.    Marland Kitchen albuterol (PROVENTIL) (2.5 MG/3ML) 0.083% nebulizer solution Take 2.5 mg by nebulization every 4 (four) hours as needed (shortness of breath).   3  . amLODipine (NORVASC) 10 MG tablet Take 10 mg by mouth daily.    Marland Kitchen  atorvastatin (LIPITOR) 10 MG tablet Take 10 mg by mouth every evening.     . budesonide-formoterol (SYMBICORT) 160-4.5 MCG/ACT inhaler Take 2 puffs first thing in am and then another 2 puffs about 12 hours later. 1 Inhaler 11  . cloNIDine (CATAPRES) 0.2 MG tablet Take 0.2 mg by mouth daily. Per pt. She did not have med bottle with her    . hydrochlorothiazide (HYDRODIURIL) 25 MG tablet Take 25 mg by mouth daily.    . pantoprazole (PROTONIX) 40 MG tablet Take 40 mg by mouth daily.    . valsartan (DIOVAN) 80 MG tablet TAKE 1 TABLET BY MOUTH EVERY DAY 30 tablet 0   No current facility-administered medications for this visit.    Allergies:   Review of patient's allergies indicates no known allergies.    Social History:  The patient  reports that she quit smoking about 2 years ago. Her smoking use included Cigarettes. She has a 19 pack-year smoking history. She has never used smokeless tobacco. She reports that she does not drink alcohol or use illicit drugs.   Family History:  The patient's family history includes Asthma in her sister; Cancer in her mother; Diabetes in her father and mother; Heart attack in her  father; Hyperlipidemia in her mother; Hypertension in her father and mother; Stomach cancer (age of onset: 30) in her mother. There is no history of Colon cancer.    ROS:  Please see the history of present illness.   Otherwise, review of systems are positive for none.   All other systems are reviewed and negative.    PHYSICAL EXAM: VS:  BP 116/68 mmHg  Pulse 90  Ht 5' 3.5" (1.613 m)  Wt 206 lb 6.4 oz (93.622 kg)  BMI 35.98 kg/m2 , BMI Body mass index is 35.98 kg/(m^2). GEN: Well nourished, well developed, in no acute distress HEENT: normal Neck: no JVD, carotid bruits, or masses Cardiac: RRR; no murmurs, rubs, or gallops,no edema  Respiratory:  clear to auscultation bilaterally, normal work of breathing GI: soft, nontender, nondistended, + BS MS: no deformity or atrophy Skin:  warm and dry, no rash Neuro:  Strength and sensation are intact Psych: euthymic mood, full affect   EKG:  EKG was ordered today and showed NSR with septal infarct and no ST changes    Recent Labs: 07/24/2014: BUN 17; Creatinine, Ser 1.30*; Potassium 3.8; Sodium 139    Lipid Panel    Component Value Date/Time   CHOL  01/05/2010 0355    184        ATP III CLASSIFICATION:  <200     mg/dL   Desirable  200-239  mg/dL   Borderline High  >=240    mg/dL   High          TRIG 62 01/05/2010 0355   HDL 57 01/05/2010 0355   CHOLHDL 3.2 01/05/2010 0355   VLDL 12 01/05/2010 0355   LDLCALC * 01/05/2010 0355    115        Total Cholesterol/HDL:CHD Risk Coronary Heart Disease Risk Table                     Men   Women  1/2 Average Risk   3.4   3.3  Average Risk       5.0   4.4  2 X Average Risk   9.6   7.1  3 X Average Risk  23.4   11.0        Use the calculated Patient Ratio above and the CHD Risk Table to determine the patient's CHD Risk.        ATP III CLASSIFICATION (LDL):  <100     mg/dL   Optimal  100-129  mg/dL   Near or Above                    Optimal  130-159  mg/dL   Borderline  160-189  mg/dL   High  >190     mg/dL   Very High      Wt Readings from Last 3 Encounters:  01/20/15 206 lb 6.4 oz (93.622 kg)  01/03/15 204 lb (92.534 kg)  07/24/14 213 lb 12.8 oz (96.979 kg)      ASSESSMENT AND PLAN:  1.  OSA on CPAP - she has stopped using the device due to intolerance to the mask. She has tried all 3 types of masks and says she cannot wear them. She was referred for an oral device but her insurance would not pay for it. She wants to go back on CPAP and still has her machine and mask.  She is going to use the full face mask.  I will get a 2 week autotitration from  4 to 20cm H2O and then set at 90th% pressure.   2. HTN - well controlled - continue amlodipine/HCTZ/Diovan/Clonidine - check BMET  3. Obesity - She has lost 9lbs since I saw her last and I  congratulated her. I encouraged her to try to get into a routine exercise program and watch her diet  4. Asymptomatic bradycardia - resolved   Current medicines are reviewed at length with the patient today.  The patient does not have concerns regarding medicines.  The following changes have been made:  no change  Labs/ tests ordered today: See above Assessment and Plan No orders of the defined types were placed in this encounter.     Disposition:   FU with me in 6 months  Signed, Sueanne Margarita, MD  01/20/2015 2:55 PM    [facility] [place], [place], [place]  [address] Phone: [phone]; Fax: [phone]

## 2015-01-21 ENCOUNTER — Telehealth: Payer: Self-pay | Admitting: Cardiology

## 2015-01-21 NOTE — Telephone Encounter (Signed)
New message ° ° ° ° °Pt returning your call °

## 2015-01-21 NOTE — Telephone Encounter (Signed)
-----   Message from Sueanne Margarita, MD sent at 01/20/2015  6:59 PM EDT ----- Stable labs - continue current meds

## 2015-01-21 NOTE — Telephone Encounter (Signed)
Informed patient of results and verbal understanding expressed.  

## 2015-04-19 ENCOUNTER — Other Ambulatory Visit: Payer: Self-pay | Admitting: Internal Medicine

## 2015-04-23 ENCOUNTER — Telehealth: Payer: Self-pay | Admitting: Internal Medicine

## 2015-04-23 MED ORDER — PANTOPRAZOLE SODIUM 40 MG PO TBEC: 40.0000 mg | DELAYED_RELEASE_TABLET | Freq: Every day | ORAL | Status: DC

## 2015-04-23 NOTE — Telephone Encounter (Signed)
Spoke with the pt  I advised we will refill med, but before refills run out will have to get her PCP to refill, or will need ov here  She verbalized understanding  Nothing further needed

## 2015-04-25 ENCOUNTER — Telehealth: Payer: Self-pay | Admitting: Internal Medicine

## 2015-04-25 ENCOUNTER — Emergency Department (HOSPITAL_COMMUNITY)
Admission: EM | Admit: 2015-04-25 | Discharge: 2015-04-25 | Disposition: A | Discharge status: Home / Self Care | Payer: Medicaid Other | Attending: Emergency Medicine | Admitting: Emergency Medicine

## 2015-04-25 ENCOUNTER — Encounter (HOSPITAL_COMMUNITY): Payer: Self-pay | Admitting: Emergency Medicine

## 2015-04-25 ENCOUNTER — Emergency Department (HOSPITAL_COMMUNITY): Payer: Medicaid Other

## 2015-04-25 DIAGNOSIS — M79641 Pain in right hand: Secondary | ICD-10-CM | POA: Diagnosis present

## 2015-04-25 DIAGNOSIS — F17210 Nicotine dependence, cigarettes, uncomplicated: Secondary | ICD-10-CM | POA: Diagnosis not present

## 2015-04-25 DIAGNOSIS — Z8669 Personal history of other diseases of the nervous system and sense organs: Secondary | ICD-10-CM | POA: Insufficient documentation

## 2015-04-25 DIAGNOSIS — R6889 Other general symptoms and signs: Secondary | ICD-10-CM

## 2015-04-25 DIAGNOSIS — J449 Chronic obstructive pulmonary disease, unspecified: Secondary | ICD-10-CM | POA: Insufficient documentation

## 2015-04-25 DIAGNOSIS — Z8719 Personal history of other diseases of the digestive system: Secondary | ICD-10-CM | POA: Insufficient documentation

## 2015-04-25 DIAGNOSIS — N189 Chronic kidney disease, unspecified: Secondary | ICD-10-CM | POA: Diagnosis not present

## 2015-04-25 DIAGNOSIS — L905 Scar conditions and fibrosis of skin: Secondary | ICD-10-CM | POA: Insufficient documentation

## 2015-04-25 DIAGNOSIS — Z79899 Other long term (current) drug therapy: Secondary | ICD-10-CM | POA: Insufficient documentation

## 2015-04-25 DIAGNOSIS — E119 Type 2 diabetes mellitus without complications: Secondary | ICD-10-CM | POA: Diagnosis not present

## 2015-04-25 DIAGNOSIS — I129 Hypertensive chronic kidney disease with stage 1 through stage 4 chronic kidney disease, or unspecified chronic kidney disease: Secondary | ICD-10-CM | POA: Diagnosis not present

## 2015-04-25 DIAGNOSIS — G8929 Other chronic pain: Secondary | ICD-10-CM | POA: Diagnosis not present

## 2015-04-25 LAB — CBG MONITORING, ED: Glucose-Capillary: 114 mg/dL — ABNORMAL HIGH (ref 65–99)

## 2015-04-25 MED ORDER — SULFAMETHOXAZOLE-TRIMETHOPRIM 800-160 MG PO TABS: 1.0000 | ORAL_TABLET | Freq: Two times a day (BID) | ORAL | Status: AC

## 2015-04-25 MED ORDER — SULFAMETHOXAZOLE-TRIMETHOPRIM 800-160 MG PO TABS
1.0000 | ORAL_TABLET | Freq: Once | ORAL | Status: AC
  Administered 2015-04-25: 1 via ORAL
  Filled 2015-04-25: qty 1

## 2015-04-25 MED ORDER — PANTOPRAZOLE SODIUM 40 MG PO TBEC: 40.0000 mg | DELAYED_RELEASE_TABLET | Freq: Every day | ORAL | Status: DC

## 2015-04-25 NOTE — ED Provider Notes (Signed)
CSN: [id number]     Arrival date & time 04/25/15  [id number] History  By signing my name below, I, Robin Clayton, attest that this documentation has been prepared under the direction and in the presence of Ottie Glazier, PA-C Electronically Signed: Ladene Clayton, ED Scribe 04/25/2015 at 10:50 AM.   Chief Complaint  Patient presents with  . Hand Pain   The history is provided by the patient. No language interpreter was used.   HPI Comments: Robin Clayton is a 59 y.o. female, with a h/o HTN and DM, who presents to the Emergency Department complaining of gradually worsening right hand pain over the past 3 months, worsened this morning. Pt states that she obtained a splinter in her right hand 3 months ago from holding onto a wooden rail. She states that she removed the splinter but is not sure if she removed all of it. Pt was not seen for this complaint. She reports associated tenderness, swelling and dark scarring to the area for the past few months. Pt tried to puncture the sight to drain the area; she reports pus-like drainage. No treatments tried PTA. Pt is right hand dominant.  She denies any fever.  Past Medical History  Diagnosis Date  . Hypertension   . Obstructive sleep apnea     moderate OSA with AHI 24/hr now intolerant to CPAP  . COPD (chronic obstructive pulmonary disease) ([place])   . GERD (gastroesophageal reflux disease)     takes tums occ  . Constipation   . Chronic bronchitis ([place])   . Type II diabetes mellitus ([place])   . Chronic lower back pain   . Chronic renal insufficiency    Past Surgical History  Procedure Laterality Date  . Cesarean section  1970  . Breast biopsy  12/06/2011    Procedure: BREAST BIOPSY WITH NEEDLE LOCALIZATION;  Surgeon: Rolm Bookbinder, MD;  Location: [facility] OR;  Service: General;  Laterality: Left;   Family History  Problem Relation Age of Onset  . Stomach cancer Mother 25  . Hypertension Mother   . Hyperlipidemia Mother   . Diabetes Mother   .  Cancer Mother     stomach  . Colon cancer Neg Hx   . Hypertension Father   . Diabetes Father   . Heart attack Father   . Asthma Sister    Social History  Substance Use Topics  . Smoking status: Current Every Day Smoker -- 0.50 packs/day for 38 years    Types: Cigarettes    Last Attempt to Quit: 09/01/2012  . Smokeless tobacco: Never Used  . Alcohol Use: No   OB History    No data available     Review of Systems  Constitutional: Negative for fatigue.  Skin: Positive for color change and wound.  Neurological: Negative for numbness.   Allergies  Review of patient's allergies indicates no known allergies.  Home Medications   Prior to Admission medications   Medication Sig Start Date End Date Taking? Authorizing Provider  albuterol (PROAIR HFA) 108 (90 BASE) MCG/ACT inhaler Inhale 2 puffs into the lungs every 6 (six) hours as needed for wheezing.    Historical Provider, MD  albuterol (PROVENTIL) (2.5 MG/3ML) 0.083% nebulizer solution Take 2.5 mg by nebulization every 4 (four) hours as needed (shortness of breath).  05/14/14   Historical Provider, MD  amLODipine (NORVASC) 10 MG tablet Take 10 mg by mouth daily.    Historical Provider, MD  atorvastatin (LIPITOR) 10 MG tablet Take 10 mg by mouth every  evening.  09/25/12   Historical Provider, MD  budesonide-formoterol (SYMBICORT) 160-4.5 MCG/ACT inhaler Take 2 puffs first thing in am and then another 2 puffs about 12 hours later. 01/03/15   [name], MD  cloNIDine (CATAPRES) 0.2 MG tablet Take 0.2 mg by mouth daily. Per pt. She did not have med bottle with her    Historical Provider, MD  hydrochlorothiazide (HYDRODIURIL) 25 MG tablet Take 25 mg by mouth daily.    Historical Provider, MD  pantoprazole (PROTONIX) 40 MG tablet Take 1 tablet (40 mg total) by mouth daily. 04/25/15   [name], MD  sulfamethoxazole-trimethoprim (BACTRIM DS,SEPTRA DS) 800-160 MG tablet Take 1 tablet by mouth 2 (two) times daily. 04/25/15 05/02/15   [name], PA-C  valsartan (DIOVAN) 80 MG tablet TAKE 1 TABLET BY MOUTH EVERY DAY 08/23/13   [name], MD   BP 127/71 mmHg  Pulse 91  Temp(Src) 97.9 F (36.6 C) (Oral)  Resp 18  Ht 5\' 4"  (1.626 m)  Wt 199 lb (90.266 kg)  BMI 34.14 kg/m2  SpO2 99% Physical Exam  Constitutional: She is oriented to person, place, and time. She appears well-developed and well-nourished. No distress.  HENT:  Head: Normocephalic and atraumatic.  Eyes: Conjunctivae and EOM are normal.  Neck: Neck supple. No tracheal deviation present.  Cardiovascular: Normal rate.   Pulmonary/Chest: Effort normal. No respiratory distress.  Musculoskeletal: Normal range of motion.  Neurological: She is alert and oriented to person, place, and time.  Skin: Skin is warm and dry.  1 cm round area of scarred tissue along the palmar surface of the  Mid R 5th metatarsal but no drainage or surrounding cellulitis. Able tot flex and extend all fingers.   Psychiatric: She has a normal mood and affect. Her behavior is normal.  Nursing note and vitals reviewed.  ED Course  Procedures (including critical care time) DIAGNOSTIC STUDIES: Oxygen Saturation is 99% on RA, normal by my interpretation.    COORDINATION OF CARE: 10:08 AM-Discussed treatment plan which includes XR and Bactrim DS with pt at bedside and pt agreed to plan.   Labs Review Labs Reviewed  CBG MONITORING, ED - Abnormal; Notable for the following:    Glucose-Capillary 114 (*)    All other components within normal limits   Imaging Review Dg Hand Complete Right  04/25/2015  CLINICAL DATA:  Recent trauma medial and with concern for residual splinter EXAM: RIGHT HAND - COMPLETE 3+ VIEW COMPARISON:  None. FINDINGS: Frontal, oblique, and lateral views were obtained. There is no demonstrable fracture or dislocation. No radiopaque foreign body identified. On the lateral view, there is a small area of soft tissue air along the palmar aspect of the hand,  superficial in location, not confirmed on other views. There is calcification within the first IP and second and third DIP joints, felt to be of arthropathic etiology. There is slight osteoarthritic change in all DIP joints. No erosive change. IMPRESSION: Multifocal distal joint osteoarthritic change, mild. No fracture or dislocation. No erosive change or bony destruction. No radiopaque foreign body appreciable. On the lateral view only, there is a superficial focus of air along the palmar aspect of the hand. This finding potentially could indicate early soft tissue infection. Electronically Signed   By: Lowella Grip III M.D.   On: 04/25/2015 10:40   I have personally reviewed and evaluated these image results as part of my medical decision-making.   EKG Interpretation None      MDM  Final diagnoses:  Suspected soft tissue infection  Patient presents for right hand pain at the site of a previous splinter a few months ago. She has scar tissue formation around the site but no obvious deformity or infection that would suggest cellulitis. She is afebrile and well-appearing. She has a normal exam and can flex and extend all fingers without difficulty. I do not suspect tendon infection. X-ray of the hand is negative for foreign body but does show some soft tissue swelling that could be the start of infection. Due to her history of diabetes I will treat her with Bactrim to cover for MRSA. I discussed return precautions with the patient as well as following up with her PCP. She agrees with the plan. Filed Vitals:   04/25/15 0946  BP: 127/71  Pulse: 91  Temp: 97.9 F (36.6 C)  Resp: 18   Medications  sulfamethoxazole-trimethoprim (BACTRIM DS,SEPTRA DS) 800-160 MG per tablet 1 tablet (1 tablet Oral Given 04/25/15 1058)   I personally performed the services described in this documentation, which was scribed in my presence. The recorded information has been reviewed and is accurate.   Ottie Glazier, PA-C 04/25/15 1155  Leo Grosser, MD 04/26/15 [phone]

## 2015-04-25 NOTE — ED Notes (Signed)
Pt reports she got a splinter in right hand four months ago. Started yesterday with pain at the sight of the splinter. No recent injury. Appears to have dark scarring at sight.

## 2015-04-25 NOTE — Telephone Encounter (Signed)
Spoke with pt. Needs refill on Pantoprazole. This was sent in on 04/23/15 but the pharmacy has not received it. Rx has been resent. Nothing further was needed.

## 2015-05-20 ENCOUNTER — Other Ambulatory Visit: Payer: Self-pay | Admitting: Family Medicine

## 2015-05-20 DIAGNOSIS — N6310 Unspecified lump in the right breast, unspecified quadrant: Secondary | ICD-10-CM

## 2015-05-28 ENCOUNTER — Other Ambulatory Visit: Payer: Self-pay | Admitting: Family Medicine

## 2015-05-28 ENCOUNTER — Ambulatory Visit
Admission: RE | Admit: 2015-05-28 | Discharge: 2015-05-28 | Disposition: A | Discharge status: Home / Self Care | Payer: Medicaid Other | Source: Ambulatory Visit | Attending: Family Medicine | Admitting: Family Medicine

## 2015-05-28 DIAGNOSIS — N6310 Unspecified lump in the right breast, unspecified quadrant: Secondary | ICD-10-CM

## 2015-06-03 ENCOUNTER — Other Ambulatory Visit: Payer: Self-pay | Admitting: Family Medicine

## 2015-06-03 ENCOUNTER — Ambulatory Visit
Admission: RE | Admit: 2015-06-03 | Discharge: 2015-06-03 | Disposition: A | Discharge status: Home / Self Care | Payer: Medicaid Other | Source: Ambulatory Visit | Attending: Family Medicine | Admitting: Family Medicine

## 2015-06-03 DIAGNOSIS — N6310 Unspecified lump in the right breast, unspecified quadrant: Secondary | ICD-10-CM

## 2015-06-03 DIAGNOSIS — N6001 Solitary cyst of right breast: Secondary | ICD-10-CM

## 2015-08-21 ENCOUNTER — Ambulatory Visit (INDEPENDENT_AMBULATORY_CARE_PROVIDER_SITE_OTHER): Payer: Medicaid Other | Admitting: Cardiology

## 2015-08-21 ENCOUNTER — Encounter: Payer: Self-pay | Admitting: Cardiology

## 2015-08-21 VITALS — BP 120/72 | HR 89 | Ht 63.0 in | Wt 209.8 lb

## 2015-08-21 DIAGNOSIS — R001 Bradycardia, unspecified: Secondary | ICD-10-CM | POA: Diagnosis not present

## 2015-08-21 DIAGNOSIS — R002 Palpitations: Secondary | ICD-10-CM | POA: Diagnosis not present

## 2015-08-21 DIAGNOSIS — I10 Essential (primary) hypertension: Secondary | ICD-10-CM

## 2015-08-21 DIAGNOSIS — G4733 Obstructive sleep apnea (adult) (pediatric): Secondary | ICD-10-CM | POA: Diagnosis not present

## 2015-08-21 NOTE — Patient Instructions (Signed)
Medication Instructions:  Your physician recommends that you continue on your current medications as directed. Please refer to the Current Medication list given to you today.   Labwork: None  Testing/Procedures: Your physician has recommended that you wear an event monitor. Event monitors are medical devices that record the heart's electrical activity. Doctors most often us these monitors to diagnose arrhythmias. Arrhythmias are problems with the speed or rhythm of the heartbeat. The monitor is a small, portable device. You can wear one while you do your normal daily activities. This is usually used to diagnose what is causing palpitations/syncope (passing out).  Follow-Up: Your physician recommends that you schedule a follow-up appointment AS NEEDED with Dr. Turner pending study results.  Any Other Special Instructions Will Be Listed Below (If Applicable).     If you need a refill on your cardiac medications before your next appointment, please call your pharmacy.   

## 2015-08-21 NOTE — Progress Notes (Signed)
Cardiology Office Note    Date:  08/21/2015   ID:  Robin Clayton, DOB 1955-08-31, MRN [id number]  PCP:  Leamon Arnt, MD  Cardiologist:  Sueanne Margarita, MD   Chief Complaint  Patient presents with  . Hypertension  . Sleep Apnea    History of Present Illness:  Robin Clayton is a 60 y.o. female with a history of bradycardia, OSA on CPAP, obesity and HTN who presents today for followup. She is doing well. She is no longer wearing the CPAP because the mask was too constraining and she went to see dentistry for oral device but insurance would not pay for it. She denies any chest pain, SOB, PND, orthopnea, dizziness or syncope. She has been noticing some fluttering in her chest on occasion.      Past Medical History  Diagnosis Date  . Hypertension   . Obstructive sleep apnea     moderate OSA with AHI 24/hr intolerant to CPAP and could not afford oral device  . COPD (chronic obstructive pulmonary disease) ([place])   . GERD (gastroesophageal reflux disease)     takes tums occ  . Constipation   . Chronic bronchitis ([place])   . Type II diabetes mellitus ([place])   . Chronic lower back pain   . Chronic renal insufficiency     Past Surgical History  Procedure Laterality Date  . Cesarean section  1970  . Breast biopsy  12/06/2011    Procedure: BREAST BIOPSY WITH NEEDLE LOCALIZATION;  Surgeon: Rolm Bookbinder, MD;  Location: [facility];  Service: General;  Laterality: Left;    Current Medications: Outpatient Prescriptions Prior to Visit  Medication Sig Dispense Refill  . albuterol (PROAIR HFA) 108 (90 BASE) MCG/ACT inhaler Inhale 2 puffs into the lungs every 6 (six) hours as needed for wheezing.    Marland Kitchen albuterol (PROVENTIL) (2.5 MG/3ML) 0.083% nebulizer solution Take 2.5 mg by nebulization every 4 (four) hours as needed (shortness of breath).   3  . amLODipine (NORVASC) 10 MG tablet Take 10 mg by mouth daily.    Marland Kitchen atorvastatin (LIPITOR) 10 MG tablet Take 10 mg by mouth every evening.     .  budesonide-formoterol (SYMBICORT) 160-4.5 MCG/ACT inhaler Take 2 puffs first thing in am and then another 2 puffs about 12 hours later. 1 Inhaler 11  . cloNIDine (CATAPRES) 0.2 MG tablet Take 0.2 mg by mouth daily. Per pt. She did not have med bottle with her    . hydrochlorothiazide (HYDRODIURIL) 25 MG tablet Take 25 mg by mouth daily.    . pantoprazole (PROTONIX) 40 MG tablet Take 1 tablet (40 mg total) by mouth daily. 30 tablet 11  . valsartan (DIOVAN) 80 MG tablet TAKE 1 TABLET BY MOUTH EVERY DAY 30 tablet 0   No facility-administered medications prior to visit.     Allergies:   Review of patient's allergies indicates no known allergies.   Social History   Social History  . Marital Status: Single    Spouse Name: N/A  . Number of Children: 7  . Years of Education: N/A   Occupational History  . Disabled    Social History Main Topics  . Smoking status: Current Every Day Smoker -- 0.50 packs/day for 38 years    Types: Cigarettes    Last Attempt to Quit: 09/01/2012  . Smokeless tobacco: Never Used  . Alcohol Use: No  . Drug Use: No  . Sexual Activity: No   Other Topics Concern  . None   Social History  Narrative     Family History:  The patient's family history includes Asthma in her sister; Cancer in her mother; Diabetes in her father and mother; Heart attack in her father; Hyperlipidemia in her mother; Hypertension in her father and mother; Stomach cancer (age of onset: 53) in her mother. There is no history of Colon cancer.   ROS:   Please see the history of present illness.    Review of Systems  Constitution: Negative.  HENT: Negative.   Eyes: Negative.   Cardiovascular: Negative.   Respiratory: Negative.   Skin: Negative.   Musculoskeletal: Positive for muscle cramps.  Gastrointestinal: Negative.   Genitourinary: Negative.   Neurological: Negative.   Psychiatric/Behavioral: Negative.    All other systems reviewed and are negative.   PHYSICAL EXAM:   VS:  BP  120/72 mmHg  Pulse 89  Ht 5\' 3"  (1.6 m)  Wt 209 lb 12.8 oz (95.165 kg)  BMI 37.17 kg/m2   GEN: Well nourished, well developed, in no acute distress HEENT: normal Neck: no JVD, carotid bruits, or masses Cardiac: RRR; no murmurs, rubs, or gallops,no edema.  Intact distal pulses bilaterally.  Respiratory:  clear to auscultation bilaterally, normal work of breathing GI: soft, nontender, nondistended, + BS MS: no deformity or atrophy Skin: warm and dry, no rash Neuro:  Alert and Oriented x 3, Strength and sensation are intact Psych: euthymic mood, full affect  Wt Readings from Last 3 Encounters:  08/21/15 209 lb 12.8 oz (95.165 kg)  04/25/15 199 lb (90.266 kg)  01/20/15 206 lb 6.4 oz (93.622 kg)      Studies/Labs Reviewed:   EKG:  EKG is not ordered today.   Recent Labs: 01/20/2015: BUN 21; Creatinine, Ser 1.45*; Potassium 3.8; Sodium 142   Lipid Panel    Component Value Date/Time   CHOL  01/05/2010 0355    184        ATP III CLASSIFICATION:  <200     mg/dL   Desirable  200-239  mg/dL   Borderline High  >=240    mg/dL   High          TRIG 62 01/05/2010 0355   HDL 57 01/05/2010 0355   CHOLHDL 3.2 01/05/2010 0355   VLDL 12 01/05/2010 0355   LDLCALC * 01/05/2010 0355    115        Total Cholesterol/HDL:CHD Risk Coronary Heart Disease Risk Table                     Men   Women  1/2 Average Risk   3.4   3.3  Average Risk       5.0   4.4  2 X Average Risk   9.6   7.1  3 X Average Risk  23.4   11.0        Use the calculated Patient Ratio above and the CHD Risk Table to determine the patient's CHD Risk.        ATP III CLASSIFICATION (LDL):  <100     mg/dL   Optimal  100-129  mg/dL   Near or Above                    Optimal  130-159  mg/dL   Borderline  160-189  mg/dL   High  >190     mg/dL   Very High    Additional studies/ records that were reviewed today include:  none    ASSESSMENT:  1. HYPERTENSION, BENIGN ESSENTIAL   2. OSA (obstructive sleep apnea)    3. Bradycardia   4. Heart palpitations      PLAN:  In order of problems listed above:  1. HTN - BP well controlled.  Continue amlodipine/Clonidine/HCTZ/ARB 2. OSA - intolerant to CPAP and cannot afford oral device. 3. Bradycardia - this has resolved 4. Palpitations - I will get a 30 day heart monitor to assess  Followup with me PRN if heart monitor is ok.  Her PCP will be following her HTN for here on out.    Medication Adjustments/Labs and Tests Ordered: Current medicines are reviewed at length with the patient today.  Concerns regarding medicines are outlined above.  Medication changes, Labs and Tests ordered today are listed in the Patient Instructions below. There are no Patient Instructions on file for this visit.   Lurena Nida, MD  08/21/2015 1:59 PM    [facility] [place], [place], [place]  [address] Phone: [phone]; Fax: [phone]

## 2015-08-28 ENCOUNTER — Ambulatory Visit (INDEPENDENT_AMBULATORY_CARE_PROVIDER_SITE_OTHER): Payer: Medicaid Other

## 2015-08-28 DIAGNOSIS — R002 Palpitations: Secondary | ICD-10-CM | POA: Diagnosis not present

## 2015-08-29 ENCOUNTER — Telehealth: Payer: Self-pay | Admitting: Student

## 2015-08-29 NOTE — Telephone Encounter (Signed)
Fax of monitor report received. Placed in MD box for review upon return as PA has already been notified.

## 2015-08-29 NOTE — Telephone Encounter (Signed)
  Received a call from [place]. The patient had a 3.4 second asymptomatic pause while asleep. Currently on a 30-day cardiac event monitor.  Signed, Erma Heritage, PA-C 08/29/2015, 7:45 AM Pager: [phone]

## 2015-09-20 ENCOUNTER — Other Ambulatory Visit: Payer: Self-pay | Admitting: Internal Medicine

## 2015-10-30 ENCOUNTER — Encounter: Payer: Self-pay | Admitting: Cardiology

## 2015-10-30 NOTE — Telephone Encounter (Signed)
New message ° ° ° °Pt verbalized that she is returning call  °

## 2015-10-30 NOTE — Telephone Encounter (Signed)
This encounter was created in error - please disregard.

## 2015-11-27 ENCOUNTER — Other Ambulatory Visit: Payer: Self-pay | Admitting: Family Medicine

## 2015-11-27 DIAGNOSIS — Z1231 Encounter for screening mammogram for malignant neoplasm of breast: Secondary | ICD-10-CM

## 2015-12-04 ENCOUNTER — Ambulatory Visit
Admission: RE | Admit: 2015-12-04 | Discharge: 2015-12-04 | Disposition: A | Discharge status: Home / Self Care | Payer: Medicaid Other | Source: Ambulatory Visit | Attending: Family Medicine | Admitting: Family Medicine

## 2015-12-04 DIAGNOSIS — Z1231 Encounter for screening mammogram for malignant neoplasm of breast: Secondary | ICD-10-CM

## 2016-04-15 ENCOUNTER — Other Ambulatory Visit: Payer: Self-pay | Admitting: Internal Medicine

## 2016-10-22 ENCOUNTER — Other Ambulatory Visit: Payer: Self-pay | Admitting: Family Medicine

## 2016-10-22 DIAGNOSIS — Z1231 Encounter for screening mammogram for malignant neoplasm of breast: Secondary | ICD-10-CM

## 2016-12-03 ENCOUNTER — Encounter: Payer: Self-pay | Admitting: Internal Medicine

## 2016-12-03 ENCOUNTER — Ambulatory Visit (INDEPENDENT_AMBULATORY_CARE_PROVIDER_SITE_OTHER): Payer: Medicaid Other | Admitting: Internal Medicine

## 2016-12-03 ENCOUNTER — Ambulatory Visit (INDEPENDENT_AMBULATORY_CARE_PROVIDER_SITE_OTHER)
Admission: RE | Admit: 2016-12-03 | Discharge: 2016-12-03 | Disposition: A | Discharge status: Home / Self Care | Payer: Medicaid Other | Source: Ambulatory Visit | Attending: Internal Medicine | Admitting: Internal Medicine

## 2016-12-03 VITALS — BP 120/74 | HR 87 | Ht 63.0 in | Wt 190.6 lb

## 2016-12-03 DIAGNOSIS — J449 Chronic obstructive pulmonary disease, unspecified: Secondary | ICD-10-CM

## 2016-12-03 DIAGNOSIS — R05 Cough: Secondary | ICD-10-CM | POA: Diagnosis not present

## 2016-12-03 DIAGNOSIS — R058 Other specified cough: Secondary | ICD-10-CM

## 2016-12-03 MED ORDER — FAMOTIDINE 20 MG PO TABS: 20.0000 mg | ORAL_TABLET | Freq: Every day | ORAL | 1 refills | Status: DC

## 2016-12-03 NOTE — Patient Instructions (Addendum)
Work on inhaler technique:  relax and gently blow all the way out then take a nice smooth deep breath back in, triggering the inhaler at same time you start breathing in.  Hold for up to 5 seconds if you can. Blow out thru nose. Rinse and gargle with water when done  Pantoprazole (protonix) 40 mg   Take  30-60 min before first meal of the day and Pepcid (famotidine)  20 mg one @  bedtime until return to office - this is the best way to tell whether stomach acid is contributing to your problem.    GERD (REFLUX)  is an extremely common cause of respiratory symptoms just like yours , many times with no obvious heartburn at all.    It can be treated with medication, but also with lifestyle changes including elevation of the head of your bed (ideally with 6 inch  bed blocks),  Smoking cessation, avoidance of late meals, excessive alcohol, and avoid fatty foods, chocolate, peppermint, colas, red wine, and acidic juices such as orange juice.  NO MINT OR MENTHOL PRODUCTS SO NO COUGH DROPS  USE SUGARLESS CANDY INSTEAD (Jolley ranchers or Stover's or Life Savers) or even ice chips will also do - the key is to swallow to prevent all throat clearing. NO OIL BASED VITAMINS - use powdered substitutes.     You are cleared for surgery    Please remember to go to the  x-ray department downstairs in the basement  for your tests - we will call you with the results when they are available.

## 2016-12-03 NOTE — Progress Notes (Signed)
Subjective:    Patient ID: Robin Clayton, female    DOB: 04/11/1956  MRN: [id number]   Brief patient profile:  47 yobf  Quit smoking 08/2012   no problems until around 2000 referred to pulmonary clinic 05/08/2012 by Dr Billey Chang with baseline wt 150 but freq steroids required for flares of ab  to wt 253 at first pulmonary clinic ov and documented GOLD II copd 12/05/12    History of Present Illness  05/08/2012 1st ov/ Robin Clayton/ cc 10 years of intermittent cough and sob responsive to short courses of prednisone progressively worse indolent onset doe even in absence of cough to point where sob across the parking lot then developed cp comes goes x 2 months lasting up to a half a day unless takes vicodin. Location is ant, generalized, assoc with overt HB, non-radiating, no worse walking. Coughing does not make it worse. rec Continue symbicort 160 Take 2 puffs first thing in am and then another 2 puffs about 12 hours later.  Pantoprazole 40 mg Take 30-60 min before first meal of the day  GERD  diet Please schedule a follow up office visit in 4 weeks, sooner if needed with pft's on return > did not return  09/15/2012 f/u ov/Robin Clayton copd/ 02 dep at baseline/ now on ACEi but quit smoking effective 08/31/12 Chief Complaint  Patient presents with  . Follow-up    Increased DOE x 2 wks, and wheezing for the past 3 days.   sob does improve with neb for an hour or two or rest  But aslo  wakes at night with hoarsness and dry cough day > niight Indolent onset, progressively worse. rec Pepcid 20 ac one at bedtime Stop lisinopril and start micardis 40 mg one daily  Plan A = automatic = symbicort 2 puff followed by one of tudorza twice daily until return Plan B = Backup = proaire up to every 4 hours only  Plan C = Nebulizer every 4 hours Prednisone 10 mg take  4 each am x 2 days,   2 each am x 2 days,  1 each am x2days and stop        03/05/2013 f/u ov/Robin Clayton re: COPD GOLD II Chief Complaint  Patient presents with   . Follow-up    Pt states that her breathing is doing well. She uses albuterol inhaler approx 3 times per wk on average- mainly in the am's.     She is confusing symbicort and saba and turns out she's just using just the symbicort occ in ams maybe 3 x weekly s limiting doe rec Ok to just use the symbicort 160 up to 2 puffs every 12 hours as needed for any respiratory problems but if you start needing the rescue inhaler then really need to be more consistent with use of symbicort (not as needed)    07/10/2013 f/u ov/Robin Clayton re: GOLD II COPD using symbicort 4 x a month / has had 02 x years for prn hs use Chief Complaint  Patient presents with  . Follow-up    Pt here to recertify for 02 qhs.  SOB with exertion, no other complaints.   really Not limited by breathing from desired activities   rec Ok to just use the symbicort 160 up to 2 puffs every 12 hours as needed for any respiratory problems but if you start needing the rescue inhaler then really need to be more consistent with use of symbicort (not as needed) 2 puffs perfectly regularly every 12 hours  Please see patient coordinator before you leave today  to schedule overnight 02 sats room air > did not do   01/05/2015 f/u ov/Robin Clayton re: obesity/ GOLD II copd on symbicort 160 2bid and rare saba  Chief Complaint  Patient presents with  . Follow-up    Pt states her breathing is doing well and has not needed to use rescue inhaler or neb. She has o2 that she uses rarely.    Not limited by breathing from desired activities  But not very active  rec Continue symbicort 160 up to 2 pffs every 12 hours  Only use your albuterol as a rescue medication to be used     12/03/2016  f/u ov/Robin Clayton re:  Copd II/ needs clearance for R hip replacement  Chief Complaint  Patient presents with  . Pulmonary Consult    Pt states needing pulmonary clearance for total hip replacement- rt. She states her surgeon (can not recall his name at the time) noticed she had a  cough and wanted her to be cleared. Pt states she has occ cough with clear sputum.  She has not needed albuterol inhaler or neb.   maint on symb 80 2bid and no need for saba - Not limited by breathing from desired activities  But by hips cough is random x one month, doesn't wake her up On protonix 40 mg randomly if takes at all   No obvious  Patterns in day to day or daytime variability with cough or assoc excess/ purulent sputum or obvious mucus plugs or hemoptysis or cp or chest tightness, subjective wheeze or overt sinus or HB/reflux  symptoms. No unusual exp hx or h/o childhood pna/ asthma or knowledge of premature birth.  Sleeping ok without nocturnal  or early am exacerbation of respiratory  c/o's or need for noct saba. Also denies any obvious fluctuation of symptoms with weather or environmental changes or other aggravating or alleviating factors except as outlined above   Current Medications, Allergies, Complete Past Medical History, Past Surgical History, Family History, and Social History were reviewed in [facility] record.  ROS  The following are not active complaints unless bolded sore throat, hoarseness, dysphagia, dental problems, itching, sneezing,  nasal congestion or excess/ purulent secretions, ear aches,   fever, chills, sweats, unintended wt loss or wt gain, classically pleuritic or exertional cp,  orthopnea pnd or leg swelling, presyncope, palpitations, abdominal pain, anorexia, nausea, vomiting, diarrhea  or change in bowel habits, change in stools or urine, dysuria,hematuria,  rash, arthralgias, visual complaints, headache, numbness, weakness or ataxia or problems with walking or coordination,  change in mood/affect or memory.                  Objective:   Physical Exam  amb obese bf nad 12/05/2012          223 > 217 03/05/2013 > 207 07/10/2013 > 01/03/2015   204 > 12/03/2016  190              09/15/12 245 lb (111.131 kg)  05/08/12 253 lb 3.2 oz  (114.851 kg)  12/17/11 241 lb 4 oz (109.43 kg)      HEENT: nl dentition, turbinates bilaterally, and oropharynx. Nl external ear canals without cough reflex   NECK :  without JVD/Nodes/TM/ nl carotid upstrokes bilaterally   LUNGS: no acc muscle use,  Nl contour chest which is clear to A and P bilaterally without cough on insp or exp maneuvers   CV:  RRR  no s3 or murmur or increase in P2, and no edema   ABD:  soft and nontender with nl inspiratory excursion in the supine position. No bruits or organomegaly appreciated, bowel sounds nl  MS:  Nl gait/ ext warm without deformities, calf tenderness, cyanosis or clubbing No obvious joint restrictions   SKIN: warm and dry without lesions    NEURO:  alert, approp, nl sensorium with  no motor or cerebellar deficits apparent.    CXR PA and Lateral:   12/03/2016 :    I personally reviewed images and agree with radiology impression as follows:    No active cardiopulmonary disease.    Assessment & Plan:

## 2016-12-05 DIAGNOSIS — R05 Cough: Secondary | ICD-10-CM | POA: Insufficient documentation

## 2016-12-05 DIAGNOSIS — R058 Other specified cough: Secondary | ICD-10-CM | POA: Insufficient documentation

## 2016-12-05 NOTE — Assessment & Plan Note (Signed)
Body mass index is 33.76 kg/m.  -  trending down/ encouraged Lab Results  Component Value Date   TSH 1.215 01/15/2011     Contributing to gerd risk/ doe/reviewed the need and the process to achieve and maintain neg calorie balance > defer f/u primary care including intermittently monitoring thyroid status

## 2016-12-05 NOTE — Assessment & Plan Note (Signed)
?   Acid (or non-acid) GERD related  > always difficult to exclude as up to 75% of pts in some series report no assoc GI/ Heartburn symptoms> rec max (24h)  acid suppression and diet restrictions/ reviewed and instructions given in writing.

## 2016-12-05 NOTE — Assessment & Plan Note (Signed)
-   quit smoking around 08/31/12  - ACEi d/c    09/16/2012 due to cough - 12/05/2012 PFTs FEV1 1.22 (58%)  64 and no better p B2,  DLCO 59 and 74%  - 12/03/2016 p extensive coaching HFA effectiveness =  75%  From a baseline  Of 50%   Her only pulmonary symptom at present is a cough that does not have the typical copd/cb features and is more likely due to UACS / gerd  (see separate a/p)   There is no "red line" in clearing stable copd pts for non-thoracic surgery but she has not problem lying flat with any issues presently like am cough/ congestion and not limted by breathing (albeit limited by hips) or need for rescue so she's a reasonable candidate for surgery/ post op rehab  Discussed in detail all the  indications, usual  risks and alternatives  relative to the benefits with patient who agrees to proceed with surgery with assoc acceptable risk  I had an extended discussion with the patient reviewing all relevant studies completed to date and  lasting 25 minutes of a 40  minute preop  visit to clear her for surgery  with potentially very serious consequences.  Each maintenance medication was reviewed in detail including most importantly the difference between maintenance and prns and under what circumstances the prns are to be triggered using an action plan format that is not reflected in the computer generated alphabetically organized AVS.    Please see AVS for specific instructions unique to this office visit that I personally wrote and verbalized to the the pt in detail and then reviewed with pt  by my nurse highlighting any changes in therapy/plan of care  recommended at today's visit.

## 2016-12-06 ENCOUNTER — Telehealth: Payer: Self-pay | Admitting: Internal Medicine

## 2016-12-06 ENCOUNTER — Ambulatory Visit
Admission: RE | Admit: 2016-12-06 | Discharge: 2016-12-06 | Disposition: A | Discharge status: Home / Self Care | Payer: Medicaid Other | Source: Ambulatory Visit | Attending: Family Medicine | Admitting: Family Medicine

## 2016-12-06 DIAGNOSIS — Z1231 Encounter for screening mammogram for malignant neoplasm of breast: Secondary | ICD-10-CM

## 2016-12-06 NOTE — Progress Notes (Signed)
LMTCB

## 2016-12-06 NOTE — Telephone Encounter (Signed)
Notes recorded by [name], MD on 12/03/2016 at 5:07 PM EDT Call pt: Reviewed cxr and no acute change so no change in recommendations made at ov ------- lmtcb X1 for pt to relay results/recs.

## 2016-12-07 NOTE — Telephone Encounter (Signed)
LM x 2

## 2016-12-07 NOTE — Telephone Encounter (Signed)
Pt is aware of CXR results and voiced her understanding. Nothing further needed.

## 2016-12-07 NOTE — Telephone Encounter (Signed)
Patient is returning phone call. [phone]

## 2017-08-11 ENCOUNTER — Ambulatory Visit: Payer: Self-pay | Admitting: Cardiology

## 2017-08-19 ENCOUNTER — Ambulatory Visit (INDEPENDENT_AMBULATORY_CARE_PROVIDER_SITE_OTHER): Payer: Medicaid Other | Admitting: Cardiology

## 2017-08-19 VITALS — BP 98/63 | HR 77 | Ht 63.0 in | Wt 192.0 lb

## 2017-08-19 DIAGNOSIS — I10 Essential (primary) hypertension: Secondary | ICD-10-CM | POA: Diagnosis not present

## 2017-08-19 DIAGNOSIS — R002 Palpitations: Secondary | ICD-10-CM

## 2017-08-19 DIAGNOSIS — G4733 Obstructive sleep apnea (adult) (pediatric): Secondary | ICD-10-CM

## 2017-08-19 LAB — BASIC METABOLIC PANEL
BUN / CREAT RATIO: 12 (ref 12–28)
BUN: 15 mg/dL (ref 8–27)
CO2: 23 mmol/L (ref 20–29)
CREATININE: 1.23 mg/dL — AB (ref 0.57–1.00)
Calcium: 9.6 mg/dL (ref 8.7–10.3)
Chloride: 102 mmol/L (ref 96–106)
GFR calc non Af Amer: 48 mL/min/{1.73_m2} — ABNORMAL LOW (ref >59)
GFR, EST AFRICAN AMERICAN: 55 mL/min/{1.73_m2} — AB (ref >59)
GLUCOSE: 116 mg/dL — AB (ref 65–99)
Potassium: 4.7 mmol/L (ref 3.5–5.2)
Sodium: 139 mmol/L (ref 134–144)

## 2017-08-19 MED ORDER — LOSARTAN POTASSIUM 50 MG PO TABS: 50.0000 mg | ORAL_TABLET | Freq: Every day | ORAL | 11 refills | Status: DC

## 2017-08-19 NOTE — Patient Instructions (Signed)
Medication Instructions:  STOP: Hyzaar START: Losartan 50 mg once a day   If you need a refill on your cardiac medications, please contact your pharmacy first.  Labwork: Today for kidney function test   Testing/Procedures: Your physician has recommended that you wear an event monitor. Event monitors are medical devices that record the heart's electrical activity. Doctors most often [place] these monitors to diagnose arrhythmias. Arrhythmias are problems with the speed or rhythm of the heartbeat. The monitor is a small, portable device. You can wear one while you do your normal daily activities. This is usually used to diagnose what is causing palpitations/syncope (passing out).  Follow-Up: Your physician recommends that you schedule a follow-up appointment in: 2 weeks with PA   Your physician recommends you follow up as needed with Dr. Radford Pax.   Any Other Special Instructions Will Be Listed Below (If Applicable).   Thank you for choosing [facility], RN  [phone]  If you need a refill on your cardiac medications before your next appointment, please call your pharmacy.

## 2017-08-19 NOTE — Progress Notes (Signed)
Cardiology Office Note:    Date:  08/19/2017   ID:  [name], DOB 1956-04-28, MRN [id number]  PCP:  Patient, No Pcp Per  Cardiologist:  No primary care provider on file.    Referring MD: Leamon Arnt, MD   Chief Complaint  Patient presents with  . Sleep Apnea  . Hypertension    History of Present Illness:    Robin Clayton is a 62 y.o. female with a hx of bradycardia, OSA on CPAP, obesity and HTN.  She is here today for followup and is doing well.  She denies any chest pain or pressure, SOB, DOE, PND, orthopnea, LE edema, dizziness or syncope. She is compliant with her meds but has been having problems with low BP.  She has not been using her CPAP for over a year due to noncompliance.  She is here today because she has been having intermittent palpitations for a few weeks.  She says sometimes she will noticed a fluttering sensation in her chest when she is lying down and she will have to sit up to make it go away.  These occur a few times a week and last a few seconds at a time.  She denies any dizziness or shortness of breath with them.   Past Medical History:  Diagnosis Date  . Chronic bronchitis ([place])   . Chronic lower back pain   . Chronic renal insufficiency   . Constipation   . COPD (chronic obstructive pulmonary disease) ([place])   . GERD (gastroesophageal reflux disease)    takes tums occ  . Hypertension   . Obstructive sleep apnea    moderate OSA with AHI 24/hr intolerant to CPAP and could not afford oral device  . Type II diabetes mellitus ([place])     Past Surgical History:  Procedure Laterality Date  . BREAST BIOPSY  12/06/2011   Procedure: BREAST BIOPSY WITH NEEDLE LOCALIZATION;  Surgeon: Rolm Bookbinder, MD;  Location: [facility];  Service: General;  Laterality: Left;  . CESAREAN SECTION  1970    Current Medications: Current Meds  Medication Sig  . albuterol (PROAIR HFA) 108 (90 BASE) MCG/ACT inhaler Inhale 2 puffs into the lungs every 6 (six) hours as needed for  wheezing.  Marland Kitchen albuterol (PROVENTIL) (2.5 MG/3ML) 0.083% nebulizer solution Take 2.5 mg by nebulization every 4 (four) hours as needed (shortness of breath).   Marland Kitchen amLODipine (NORVASC) 10 MG tablet Take 10 mg by mouth daily.  Marland Kitchen aspirin 81 MG tablet Take 81 mg by mouth daily.  Marland Kitchen atorvastatin (LIPITOR) 10 MG tablet Take 10 mg by mouth every evening.   . budesonide-formoterol (SYMBICORT) 160-4.5 MCG/ACT inhaler Take 2 puffs first thing in am and then another 2 puffs about 12 hours later.  . cloNIDine (CATAPRES) 0.2 MG tablet Take 0.2 mg by mouth daily. Per pt. She did not have med bottle with her  . famotidine (PEPCID) 20 MG tablet Take 1 tablet (20 mg total) by mouth at bedtime.  Marland Kitchen losartan-hydrochlorothiazide (HYZAAR) 50-12.5 MG tablet Take 1 tablet by mouth daily.  . metFORMIN (GLUCOPHAGE) 500 MG tablet Take 500 mg by mouth 2 (two) times daily.  . methocarbamol (ROBAXIN) 500 MG tablet Take 500 mg by mouth as needed.  . naproxen sodium (ALEVE) 220 MG tablet Take 220 mg by mouth as needed.  Marland Kitchen oxyCODONE (OXY IR/ROXICODONE) 5 MG immediate release tablet Take 5 mg by mouth as needed.  . pantoprazole (PROTONIX) 40 MG tablet Take 1 tablet (40 mg total) by mouth  daily.  . [DISCONTINUED] hydrochlorothiazide (HYDRODIURIL) 25 MG tablet Take 25 mg by mouth daily.  . [DISCONTINUED] valsartan (DIOVAN) 80 MG tablet TAKE 1 TABLET BY MOUTH EVERY DAY     Allergies:   Wound dressings   Social History   Socioeconomic History  . Marital status: Single    Spouse name: Not on file  . Number of children: 7  . Years of education: Not on file  . Highest education level: Not on file  Occupational History  . Occupation: Disabled  Social Needs  . Financial resource strain: Not on file  . Food insecurity:    Worry: Not on file    Inability: Not on file  . Transportation needs:    Medical: Not on file    Non-medical: Not on file  Tobacco Use  . Smoking status: Former Smoker    Packs/day: 0.50    Years: 38.00      Pack years: 19.00    Types: Cigarettes    Last attempt to quit: 05/03/2014    Years since quitting: 3.2  . Smokeless tobacco: Never Used  Substance and Sexual Activity  . Alcohol use: No  . Drug use: No  . Sexual activity: Never  Lifestyle  . Physical activity:    Days per week: Not on file    Minutes per session: Not on file  . Stress: Not on file  Relationships  . Social connections:    Talks on phone: Not on file    Gets together: Not on file    Attends religious service: Not on file    Active member of club or organization: Not on file    Attends meetings of clubs or organizations: Not on file    Relationship status: Not on file  Other Topics Concern  . Not on file  Social History Narrative  . Not on file     Family History: The patient's family history includes Asthma in her sister; Cancer in her mother; Diabetes in her father and mother; Heart attack in her father; Hyperlipidemia in her mother; Hypertension in her father and mother; Stomach cancer (age of onset: 58) in her mother. There is no history of Colon cancer.  ROS:   Please see the history of present illness.    ROS  All other systems reviewed and negative.   EKGs/Labs/Other Studies Reviewed:    The following studies were reviewed today: PAP download  EKG:  EKG is ordered today and showed NSR at 77bpm with septal infarct and no ST changes  Recent Labs: No results found for requested labs within last 8760 hours.   Recent Lipid Panel    Component Value Date/Time   CHOL  01/05/2010 0355    184        ATP III CLASSIFICATION:  <200     mg/dL   Desirable  200-239  mg/dL   Borderline High  >=240    mg/dL   High          TRIG 62 01/05/2010 0355   HDL 57 01/05/2010 0355   CHOLHDL 3.2 01/05/2010 0355   VLDL 12 01/05/2010 0355   LDLCALC (H) 01/05/2010 0355    115        Total Cholesterol/HDL:CHD Risk Coronary Heart Disease Risk Table                     Men   Women  1/2 Average Risk   3.4   3.3   Average Risk  5.0   4.4  2 X Average Risk   9.6   7.1  3 X Average Risk  23.4   11.0        Use the calculated Patient Ratio above and the CHD Risk Table to determine the patient's CHD Risk.        ATP III CLASSIFICATION (LDL):  <100     mg/dL   Optimal  100-129  mg/dL   Near or Above                    Optimal  130-159  mg/dL   Borderline  160-189  mg/dL   High  >190     mg/dL   Very High    Physical Exam:    VS:  BP 98/63   Pulse 77   Ht 5\' 3"  (1.6 m)   Wt 192 lb (87.1 kg)   BMI 34.01 kg/m     Wt Readings from Last 3 Encounters:  08/19/17 192 lb (87.1 kg)  12/03/16 190 lb 9.6 oz (86.5 kg)  08/21/15 209 lb 12.8 oz (95.2 kg)     GEN:  Well nourished, well developed in no acute distress HEENT: Normal NECK: No JVD; No carotid bruits LYMPHATICS: No lymphadenopathy CARDIAC: RRR, no murmurs, rubs, gallops RESPIRATORY:  Clear to auscultation without rales, wheezing or rhonchi  ABDOMEN: Soft, non-tender, non-distended MUSCULOSKELETAL:  No edema; No deformity  SKIN: Warm and dry NEUROLOGIC:  Alert and oriented x 3 PSYCHIATRIC:  Normal affect   ASSESSMENT:    1. OSA (obstructive sleep apnea)   2. HYPERTENSION, BENIGN ESSENTIAL   3. Morbid obesity due to excess calories ([place])    PLAN:    In order of problems listed above:  1.  OSA -she is intolerant to CPAP therapy.  2.  HTN - BP well controlled on exam today and actually on the soft side.  She will continue on Clonidine 0.2mg  daily, amlodipine 10 mg daily.  I will change her Hyzaar to losartan 50mg  daily without the diuretic. She will followup with my PA in 2 weeks.  I will check a BMET today.    3. Morbid obesity - I have encouraged her to get into a routine exercise program and cut back on carbs and portions.   4.  Palpitations -she has been having intermittent fluttering in her chest for a few weeks now but is off and on.  I am going to get a 30-day event monitor to make sure she is not having atrial  fibrillation.  She will follow-up with my PA in 2 weeks to make sure that her blood pressure is adequately controlled.  If blood pressure is normal and her event monitor shows no arrhythmias she can follow-up with me on a as needed basis.   Medication Adjustments/Labs and Tests Ordered: Current medicines are reviewed at length with the patient today.  Concerns regarding medicines are outlined above.  No orders of the defined types were placed in this encounter.  No orders of the defined types were placed in this encounter.   Signed, Fransico Him, MD  08/19/2017 11:11 AM    [place]

## 2017-09-07 ENCOUNTER — Ambulatory Visit (INDEPENDENT_AMBULATORY_CARE_PROVIDER_SITE_OTHER): Payer: Medicaid Other | Admitting: Physician Assistant

## 2017-09-07 ENCOUNTER — Encounter: Payer: Self-pay | Admitting: Physician Assistant

## 2017-09-07 ENCOUNTER — Ambulatory Visit (INDEPENDENT_AMBULATORY_CARE_PROVIDER_SITE_OTHER): Payer: Medicaid Other

## 2017-09-07 VITALS — BP 114/54 | HR 81 | Ht 64.0 in | Wt 194.0 lb

## 2017-09-07 DIAGNOSIS — I10 Essential (primary) hypertension: Secondary | ICD-10-CM | POA: Diagnosis not present

## 2017-09-07 DIAGNOSIS — R002 Palpitations: Secondary | ICD-10-CM

## 2017-09-07 DIAGNOSIS — G4733 Obstructive sleep apnea (adult) (pediatric): Secondary | ICD-10-CM

## 2017-09-07 MED ORDER — LOSARTAN POTASSIUM-HCTZ 50-12.5 MG PO TABS: 1.0000 | ORAL_TABLET | Freq: Every day | ORAL | 0 refills | Status: DC

## 2017-09-07 NOTE — Patient Instructions (Addendum)
Medication Instructions:  Your physician recommends that you continue on your current medications as directed. Please refer to the Current Medication list given to you today.  Labwork: None ordered  Testing/Procedures: None ordered  Follow-Up: To be determined after monitor results have been reviewed by Dr. Radford Pax.   * If you need a refill on your cardiac medications before your next appointment, please call your pharmacy.   *Please note that any paperwork needing to be filled out by the provider will need to be addressed at the front desk prior to seeing the provider. Please note that any FMLA, disability or other documents regarding health condition is subject to a $25.00 charge that must be received prior to completion of paperwork in the form of a money order or check.  Thank you for choosing [facility]!!    Any Other Special Instructions Will Be Listed Below (If Applicable).  We recommend that you follow a low sodium diet.   Low-Sodium Eating Plan Sodium, which is an element that makes up salt, helps you maintain a healthy balance of fluids in your body. Too much sodium can increase your blood pressure and cause fluid and waste to be held in your body. Your health care provider or dietitian may recommend following this plan if you have high blood pressure (hypertension), kidney disease, liver disease, or heart failure. Eating less sodium can help lower your blood pressure, reduce swelling, and protect your heart, liver, and kidneys. What are tips for following this plan? General guidelines  Most people on this plan should limit their sodium intake to 1,500-2,000 mg (milligrams) of sodium each day. Reading food labels  The Nutrition Facts label lists the amount of sodium in one serving of the food. If you eat more than one serving, you must multiply the listed amount of sodium by the number of servings.  Choose foods with less than 140 mg of sodium per serving.  Avoid  foods with 300 mg of sodium or more per serving. Shopping  Look for lower-sodium products, often labeled as "low-sodium" or "no salt added."  Always check the sodium content even if foods are labeled as "unsalted" or "no salt added".  Buy fresh foods. ? Avoid canned foods and premade or frozen meals. ? Avoid canned, cured, or processed meats  Buy breads that have less than 80 mg of sodium per slice. Cooking  Eat more home-cooked food and less restaurant, buffet, and fast food.  Avoid adding salt when cooking. Use salt-free seasonings or herbs instead of table salt or sea salt. Check with your health care provider or pharmacist before using salt substitutes.  Cook with plant-based oils, such as canola, sunflower, or olive oil. Meal planning  When eating at a restaurant, ask that your food be prepared with less salt or no salt, if possible.  Avoid foods that contain MSG (monosodium glutamate). MSG is sometimes added to [place] food, bouillon, and some canned foods. What foods are recommended? The items listed may not be a complete list. Talk with your dietitian about what dietary choices are best for you. Grains Low-sodium cereals, including oats, puffed wheat and rice, and shredded wheat. Low-sodium crackers. Unsalted rice. Unsalted pasta. Low-sodium bread. Whole-grain breads and whole-grain pasta. Vegetables Fresh or frozen vegetables. "No salt added" canned vegetables. "No salt added" tomato sauce and paste. Low-sodium or reduced-sodium tomato and vegetable juice. Fruits Fresh, frozen, or canned fruit. Fruit juice. Meats and other protein foods Fresh or frozen (no salt added) meat, poultry, seafood, and  fish. Low-sodium canned tuna and salmon. Unsalted nuts. Dried peas, beans, and lentils without added salt. Unsalted canned beans. Eggs. Unsalted nut butters. Dairy Milk. Soy milk. Cheese that is naturally low in sodium, such as ricotta cheese, fresh mozzarella, or Swiss cheese  Low-sodium or reduced-sodium cheese. Cream cheese. Yogurt. Fats and oils Unsalted butter. Unsalted margarine with no trans fat. Vegetable oils such as canola or olive oils. Seasonings and other foods Fresh and dried herbs and spices. Salt-free seasonings. Low-sodium mustard and ketchup. Sodium-free salad dressing. Sodium-free light mayonnaise. Fresh or refrigerated horseradish. Lemon juice. Vinegar. Homemade, reduced-sodium, or low-sodium soups. Unsalted popcorn and pretzels. Low-salt or salt-free chips. What foods are not recommended? The items listed may not be a complete list. Talk with your dietitian about what dietary choices are best for you. Grains Instant hot cereals. Bread stuffing, pancake, and biscuit mixes. Croutons. Seasoned rice or pasta mixes. Noodle soup cups. Boxed or frozen macaroni and cheese. Regular salted crackers. Self-rising flour. Vegetables Sauerkraut, pickled vegetables, and relishes. Olives. [place] fries. Onion rings. Regular canned vegetables (not low-sodium or reduced-sodium). Regular canned tomato sauce and paste (not low-sodium or reduced-sodium). Regular tomato and vegetable juice (not low-sodium or reduced-sodium). Frozen vegetables in sauces. Meats and other protein foods Meat or fish that is salted, canned, smoked, spiced, or pickled. Bacon, ham, sausage, hotdogs, corned beef, chipped beef, packaged lunch meats, salt pork, jerky, pickled herring, anchovies, regular canned tuna, sardines, salted nuts. Dairy Processed cheese and cheese spreads. Cheese curds. Blue cheese. Feta cheese. String cheese. Regular cottage cheese. Buttermilk. Canned milk. Fats and oils Salted butter. Regular margarine. Ghee. Bacon fat. Seasonings and other foods Onion salt, garlic salt, seasoned salt, table salt, and sea salt. Canned and packaged gravies. Worcestershire sauce. Tartar sauce. Barbecue sauce. Teriyaki sauce. Soy sauce, including reduced-sodium. Steak sauce. Fish sauce. Oyster  sauce. Cocktail sauce. Horseradish that you find on the shelf. Regular ketchup and mustard. Meat flavorings and tenderizers. Bouillon cubes. Hot sauce and Tabasco sauce. Premade or packaged marinades. Premade or packaged taco seasonings. Relishes. Regular salad dressings. Salsa. Potato and tortilla chips. Corn chips and puffs. Salted popcorn and pretzels. Canned or dried soups. Pizza. Frozen entrees and pot pies. Summary  Eating less sodium can help lower your blood pressure, reduce swelling, and protect your heart, liver, and kidneys.  Most people on this plan should limit their sodium intake to 1,500-2,000 mg (milligrams) of sodium each day.  Canned, boxed, and frozen foods are high in sodium. Restaurant foods, fast foods, and pizza are also very high in sodium. You also get sodium by adding salt to food.  Try to cook at home, eat more fresh fruits and vegetables, and eat less fast food, canned, processed, or prepared foods. This information is not intended to replace advice given to you by your health care provider. Make sure you discuss any questions you have with your health care provider. Document Released: 10/09/2001 Document Revised: 04/12/2016 Document Reviewed: 04/12/2016 Elsevier Interactive Patient Education  2018 [place] With Less [employer] with less salt is one way to reduce the amount of sodium you get from food. Depending on your condition and overall health, your health care provider or diet and nutrition specialist (dietitian) may recommend that you reduce your sodium intake. Most people should have less than 2,300 milligrams (mg) of sodium each day. If you have high blood pressure (hypertension), you may need to limit your sodium to 1,500 mg each day. Follow the tips below to help  reduce your sodium intake. What do I need to know about cooking with less salt? Shopping  Buy sodium-free or low-sodium products. Look for the following words on food  labels: ? Low-sodium. ? Sodium-free. ? Reduced-sodium. ? No salt added. ? Unsalted.  Buy fresh or frozen vegetables. Avoid canned vegetables.  Avoid buying meats or protein foods that have been injected with broth or saline solution.  Avoid cured or smoked meats, such as hot dogs, bacon, salami, ham, and bologna. Reading food labels  Check the food label before buying or using packaged ingredients.  Look for products with no more than 140 mg of sodium in one serving.  Do not choose foods with salt as one of the first three ingredients on the ingredients list. If salt is one of the first three ingredients, it usually means the item is high in sodium, because ingredients are listed in order of amount in the food item. Cooking  Use herbs, seasonings without salt, and spices as substitutes for salt in foods.  Use sodium-free baking soda when baking.  Grill, braise, or roast foods to add flavor with less salt.  Avoid adding salt to pasta, rice, or hot cereals while cooking.  Drain and rinse canned vegetables before use.  Avoid adding salt when cooking sweets and desserts.  Cook with low-sodium ingredients. What are some salt alternatives? The following are herbs, seasonings, and spices that can be used instead of salt to give taste to your food. Herbs should be fresh or dried. Do not choose packaged mixes. Next to the name of the herb, spice, or seasoning are some examples of foods you can pair it with. Herbs  Bay leaves - Soups, meat and vegetable dishes, and spaghetti sauce.  Basil - [employer], soups, pasta, and fish dishes.  Cilantro - Meat, poultry, and vegetable dishes.  Chili powder - Marinades and Mexican dishes.  Chives - Salad dressings and potato dishes.  Cumin - Mexican dishes, couscous, and meat dishes.  Dill - Fish dishes, sauces, and salads.  Fennel - Meat and vegetable dishes, breads, and cookies.  Garlic (do not use garlic salt) - [place] dishes,  meat dishes, salad dressings, and sauces.  Marjoram - Soups, potato dishes, and meat dishes.  Oregano - Pizza and spaghetti sauce.  Parsley - Salads, soups, pasta, and meat dishes.  Rosemary - [place] dishes, salad dressings, soups, and red meats.  Saffron - Fish dishes, pasta, and some poultry dishes.  Sage - Stuffings and sauces.  Tarragon - Fish and [employer].  Thyme - Stuffing, meat, and fish dishes. Seasonings  Lemon juice - Fish dishes, poultry dishes, vegetables, and salads.  Vinegar - Salad dressings, vegetables, and fish dishes. Spices  Cinnamon - Sweet dishes, such as cakes, cookies, and puddings.  Cloves - Gingerbread, puddings, and marinades for meats.  Curry - Vegetable dishes, fish and poultry dishes, and stir-fry dishes.  Ginger - Vegetables dishes, fish dishes, and stir-fry dishes.  Nutmeg - Pasta, vegetables, poultry, fish dishes, and custard. What are some low-sodium ingredients and foods?  Fresh or frozen fruits and vegetables with no sauce added.  Fresh or frozen whole meats, poultry, and fish with no sauce added.  Eggs.  Noodles, pasta, quinoa, rice.  Shredded or puffed wheat or puffed rice.  Regular or quick oats.  Milk, yogurt, hard cheeses, and low-sodium cheeses. Good cheese choices include Swiss, [place]. Always check the label for the serving size and sodium content.  Unsalted butter or margarine.  Unsalted nuts.  Sherbet or ice cream (keep to  cup per serving).  Homemade pudding.  Sodium-free baking soda and baking powder. This is not a complete list of low-sodium ingredients and foods. Contact your dietitian for more options. Summary  Cooking with less salt is one way to reduce the amount of sodium that you get from food.  Buy sodium-free or low-sodium products.  Check the food label before using or buying packaged ingredients.  Use herbs, seasonings without salt, and spices as substitutes for  salt in foods. This information is not intended to replace advice given to you by your health care provider. Make sure you discuss any questions you have with your health care provider. Document Released: 04/19/2005 Document Revised: 04/27/2016 Document Reviewed: 04/27/2016 Elsevier Interactive Patient Education  2017 [employer].

## 2017-09-07 NOTE — Progress Notes (Signed)
Cardiology Office Note    Date:  09/07/2017   ID:  Robin Clayton, DOB [birth year], MRN [id number]  PCP:  Bernerd Limbo, MD  Cardiologist: Fransico Him, MD  No chief complaint on file.   History of Present Illness:  Robin Clayton is a 62 y.o. female with history of hypertension, bradycardia, obesity, OSA intolerant to CPAP.  Last saw Dr. Radford Pax 08/19/2017 at which time she was having intermittent palpitations.  Blood pressure was on the soft side and she changed her Hyzaar to losartan 50 mg without a diuretic.  30-day monitor also ordered which is being placed today.  Patient comes in today for follow-up of blood pressure.  She never stopped her Hyzaar because she thinks the hydrochlorothiazide is protecting her kidneys and helping her swelling in her ankles.  She says her blood pressure was low because of her recent hip surgery and has now come back up.  She does not want to stop the Hyzaar.  Her creatinine was 1.23 when checked at that office visit.    Past Medical History:  Diagnosis Date  . Chronic bronchitis ([place])   . Chronic lower back pain   . Chronic renal insufficiency   . Constipation   . COPD (chronic obstructive pulmonary disease) ([place])   . GERD (gastroesophageal reflux disease)    takes tums occ  . Hypertension   . Obstructive sleep apnea    moderate OSA with AHI 24/hr intolerant to CPAP and could not afford oral device  . Type II diabetes mellitus ([place])     Past Surgical History:  Procedure Laterality Date  . BREAST BIOPSY  12/06/2011   Procedure: BREAST BIOPSY WITH NEEDLE LOCALIZATION;  Surgeon: Rolm Bookbinder, MD;  Location: [facility];  Service: General;  Laterality: Left;  . CESAREAN SECTION  1970    Current Medications: Current Meds  Medication Sig  . albuterol (PROAIR HFA) 108 (90 BASE) MCG/ACT inhaler Inhale 2 puffs into the lungs every 6 (six) hours as needed for wheezing.  Marland Kitchen albuterol (PROVENTIL) (2.5 MG/3ML) 0.083% nebulizer solution Take 2.5 mg by  nebulization every 4 (four) hours as needed (shortness of breath).   Marland Kitchen amLODipine (NORVASC) 10 MG tablet Take 10 mg by mouth daily.  Marland Kitchen aspirin 81 MG tablet Take 81 mg by mouth daily.  Marland Kitchen atorvastatin (LIPITOR) 10 MG tablet Take 10 mg by mouth every evening.   . budesonide-formoterol (SYMBICORT) 160-4.5 MCG/ACT inhaler Take 2 puffs first thing in am and then another 2 puffs about 12 hours later.  . cloNIDine (CATAPRES) 0.2 MG tablet Take 0.2 mg by mouth daily.   . famotidine (PEPCID) 20 MG tablet Take 1 tablet (20 mg total) by mouth at bedtime.  . gabapentin (NEURONTIN) 100 MG capsule Take by mouth.  . metFORMIN (GLUCOPHAGE) 500 MG tablet Take 500 mg by mouth 2 (two) times daily.  . methocarbamol (ROBAXIN) 500 MG tablet Take 500 mg by mouth as needed for muscle spasms.   . naproxen sodium (ALEVE) 220 MG tablet Take 220 mg by mouth as needed (PAIN).   Marland Kitchen pantoprazole (PROTONIX) 40 MG tablet Take 1 tablet (40 mg total) by mouth daily.  . [DISCONTINUED] losartan (COZAAR) 50 MG tablet Take 1 tablet (50 mg total) by mouth daily.     Allergies:   Wound dressings   Social History   Socioeconomic History  . Marital status: Single    Spouse name: Not on file  . Number of children: 7  . Years of education: Not on file  .  Highest education level: Not on file  Occupational History  . Occupation: Disabled  Social Needs  . Financial resource strain: Not on file  . Food insecurity:    Worry: Not on file    Inability: Not on file  . Transportation needs:    Medical: Not on file    Non-medical: Not on file  Tobacco Use  . Smoking status: Former Smoker    Packs/day: 0.50    Years: 38.00    Pack years: 19.00    Types: Cigarettes    Last attempt to quit: 05/03/2014    Years since quitting: 3.3  . Smokeless tobacco: Never Used  Substance and Sexual Activity  . Alcohol use: No  . Drug use: No  . Sexual activity: Never  Lifestyle  . Physical activity:    Days per week: Not on file    Minutes  per session: Not on file  . Stress: Not on file  Relationships  . Social connections:    Talks on phone: Not on file    Gets together: Not on file    Attends religious service: Not on file    Active member of club or organization: Not on file    Attends meetings of clubs or organizations: Not on file    Relationship status: Not on file  Other Topics Concern  . Not on file  Social History Narrative  . Not on file     Family History:  The patient's family history includes Asthma in her sister; Cancer in her mother; Diabetes in her father and mother; Heart attack in her father; Hyperlipidemia in her mother; Hypertension in her father and mother; Stomach cancer (age of onset: 5) in her mother.   ROS:   Please see the history of present illness.    Review of Systems  Constitution: Negative.  HENT: Negative.   Eyes: Negative.   Cardiovascular: Positive for dyspnea on exertion, leg swelling and palpitations.  Respiratory: Negative.   Hematologic/Lymphatic: Negative.   Musculoskeletal: Negative.  Negative for joint pain.  Gastrointestinal: Positive for constipation.  Genitourinary: Negative.   Neurological: Negative.    All other systems reviewed and are negative.   PHYSICAL EXAM:   VS:  BP (!) 114/54   Pulse 81   Ht 5\' 4"  (1.626 m)   Wt 194 lb (88 kg)   SpO2 97%   BMI 33.30 kg/m   Physical Exam  GEN: Well nourished, well developed, in no acute distress  Neck: no JVD, carotid bruits, or masses Cardiac:RRR; no murmurs, rubs, or gallops  Respiratory:  clear to auscultation bilaterally, normal work of breathing GI: soft, nontender, nondistended, + BS Ext: without cyanosis, clubbing, or edema, Good distal pulses bilaterally Neuro:  Alert and Oriented x 3 Psych: euthymic mood, full affect  Wt Readings from Last 3 Encounters:  09/07/17 194 lb (88 kg)  08/19/17 192 lb (87.1 kg)  12/03/16 190 lb 9.6 oz (86.5 kg)      Studies/Labs Reviewed:   EKG:  EKG is not ordered  today.   Recent Labs: 08/19/2017: BUN 15; Creatinine, Ser 1.23; Potassium 4.7; Sodium 139   Lipid Panel    Component Value Date/Time   CHOL  01/05/2010 0355    184        ATP III CLASSIFICATION:  <200     mg/dL   Desirable  200-239  mg/dL   Borderline High  >=240    mg/dL   High  TRIG 62 01/05/2010 0355   HDL 57 01/05/2010 0355   CHOLHDL 3.2 01/05/2010 0355   VLDL 12 01/05/2010 0355   LDLCALC (H) 01/05/2010 0355    115        Total Cholesterol/HDL:CHD Risk Coronary Heart Disease Risk Table                     Men   Women  1/2 Average Risk   3.4   3.3  Average Risk       5.0   4.4  2 X Average Risk   9.6   7.1  3 X Average Risk  23.4   11.0        Use the calculated Patient Ratio above and the CHD Risk Table to determine the patient's CHD Risk.        ATP III CLASSIFICATION (LDL):  <100     mg/dL   Optimal  100-129  mg/dL   Near or Above                    Optimal  130-159  mg/dL   Borderline  160-189  mg/dL   High  >190     mg/dL   Very High    Additional studies/ records that were reviewed today include:      ASSESSMENT:    1. HYPERTENSION, BENIGN ESSENTIAL   2. Heart palpitations   3. OSA (obstructive sleep apnea)      PLAN:  In order of problems listed above:  Hypertension patient's blood pressure was on the low side and Dr. Radford Pax changed Hyzaar to losartan without diuretic patient never made the change and is not willing to give up the diuretic because of leg edema.  Her blood pressure is now back up.  Creatinine was stable at 1.23 last office visit.  Will continue Hyzaar 50/12.5 mg.  Palpitations 30-day monitor being placed today  OSA intolerant to CPAP    Medication Adjustments/Labs and Tests Ordered: Current medicines are reviewed at length with the patient today.  Concerns regarding medicines are outlined above.  Medication changes, Labs and Tests ordered today are listed in the Patient Instructions below. Patient Instructions    Medication Instructions:  Your physician recommends that you continue on your current medications as directed. Please refer to the Current Medication list given to you today.  Labwork: None ordered  Testing/Procedures: None ordered  Follow-Up: To be determined after monitor results have been reviewed by Dr. Radford Pax.   * If you need a refill on your cardiac medications before your next appointment, please call your pharmacy.   *Please note that any paperwork needing to be filled out by the provider will need to be addressed at the front desk prior to seeing the provider. Please note that any FMLA, disability or other documents regarding health condition is subject to a $25.00 charge that must be received prior to completion of paperwork in the form of a money order or check.  Thank you for choosing [facility]!!    Any Other Special Instructions Will Be Listed Below (If Applicable).  We recommend that you follow a low sodium diet.   Low-Sodium Eating Plan Sodium, which is an element that makes up salt, helps you maintain a healthy balance of fluids in your body. Too much sodium can increase your blood pressure and cause fluid and waste to be held in your body. Your health care provider or dietitian may recommend following this plan if you  have high blood pressure (hypertension), kidney disease, liver disease, or heart failure. Eating less sodium can help lower your blood pressure, reduce swelling, and protect your heart, liver, and kidneys. What are tips for following this plan? General guidelines  Most people on this plan should limit their sodium intake to 1,500-2,000 mg (milligrams) of sodium each day. Reading food labels  The Nutrition Facts label lists the amount of sodium in one serving of the food. If you eat more than one serving, you must multiply the listed amount of sodium by the number of servings.  Choose foods with less than 140 mg of sodium per serving.  Avoid  foods with 300 mg of sodium or more per serving. Shopping  Look for lower-sodium products, often labeled as "low-sodium" or "no salt added."  Always check the sodium content even if foods are labeled as "unsalted" or "no salt added".  Buy fresh foods. ? Avoid canned foods and premade or frozen meals. ? Avoid canned, cured, or processed meats  Buy breads that have less than 80 mg of sodium per slice. Cooking  Eat more home-cooked food and less restaurant, buffet, and fast food.  Avoid adding salt when cooking. Use salt-free seasonings or herbs instead of table salt or sea salt. Check with your health care provider or pharmacist before using salt substitutes.  Cook with plant-based oils, such as canola, sunflower, or olive oil. Meal planning  When eating at a restaurant, ask that your food be prepared with less salt or no salt, if possible.  Avoid foods that contain MSG (monosodium glutamate). MSG is sometimes added to [place] food, bouillon, and some canned foods. What foods are recommended? The items listed may not be a complete list. Talk with your dietitian about what dietary choices are best for you. Grains Low-sodium cereals, including oats, puffed wheat and rice, and shredded wheat. Low-sodium crackers. Unsalted rice. Unsalted pasta. Low-sodium bread. Whole-grain breads and whole-grain pasta. Vegetables Fresh or frozen vegetables. "No salt added" canned vegetables. "No salt added" tomato sauce and paste. Low-sodium or reduced-sodium tomato and vegetable juice. Fruits Fresh, frozen, or canned fruit. Fruit juice. Meats and other protein foods Fresh or frozen (no salt added) meat, poultry, seafood, and fish. Low-sodium canned tuna and salmon. Unsalted nuts. Dried peas, beans, and lentils without added salt. Unsalted canned beans. Eggs. Unsalted nut butters. Dairy Milk. Soy milk. Cheese that is naturally low in sodium, such as ricotta cheese, fresh mozzarella, or Swiss cheese  Low-sodium or reduced-sodium cheese. Cream cheese. Yogurt. Fats and oils Unsalted butter. Unsalted margarine with no trans fat. Vegetable oils such as canola or olive oils. Seasonings and other foods Fresh and dried herbs and spices. Salt-free seasonings. Low-sodium mustard and ketchup. Sodium-free salad dressing. Sodium-free light mayonnaise. Fresh or refrigerated horseradish. Lemon juice. Vinegar. Homemade, reduced-sodium, or low-sodium soups. Unsalted popcorn and pretzels. Low-salt or salt-free chips. What foods are not recommended? The items listed may not be a complete list. Talk with your dietitian about what dietary choices are best for you. Grains Instant hot cereals. Bread stuffing, pancake, and biscuit mixes. Croutons. Seasoned rice or pasta mixes. Noodle soup cups. Boxed or frozen macaroni and cheese. Regular salted crackers. Self-rising flour. Vegetables Sauerkraut, pickled vegetables, and relishes. Olives. [place] fries. Onion rings. Regular canned vegetables (not low-sodium or reduced-sodium). Regular canned tomato sauce and paste (not low-sodium or reduced-sodium). Regular tomato and vegetable juice (not low-sodium or reduced-sodium). Frozen vegetables in sauces. Meats and other protein foods Meat or fish that is salted,  canned, smoked, spiced, or pickled. Bacon, ham, sausage, hotdogs, corned beef, chipped beef, packaged lunch meats, salt pork, jerky, pickled herring, anchovies, regular canned tuna, sardines, salted nuts. Dairy Processed cheese and cheese spreads. Cheese curds. Blue cheese. Feta cheese. String cheese. Regular cottage cheese. Buttermilk. Canned milk. Fats and oils Salted butter. Regular margarine. Ghee. Bacon fat. Seasonings and other foods Onion salt, garlic salt, seasoned salt, table salt, and sea salt. Canned and packaged gravies. Worcestershire sauce. Tartar sauce. Barbecue sauce. Teriyaki sauce. Soy sauce, including reduced-sodium. Steak sauce. Fish sauce. Oyster  sauce. Cocktail sauce. Horseradish that you find on the shelf. Regular ketchup and mustard. Meat flavorings and tenderizers. Bouillon cubes. Hot sauce and Tabasco sauce. Premade or packaged marinades. Premade or packaged taco seasonings. Relishes. Regular salad dressings. Salsa. Potato and tortilla chips. Corn chips and puffs. Salted popcorn and pretzels. Canned or dried soups. Pizza. Frozen entrees and pot pies. Summary  Eating less sodium can help lower your blood pressure, reduce swelling, and protect your heart, liver, and kidneys.  Most people on this plan should limit their sodium intake to 1,500-2,000 mg (milligrams) of sodium each day.  Canned, boxed, and frozen foods are high in sodium. Restaurant foods, fast foods, and pizza are also very high in sodium. You also get sodium by adding salt to food.  Try to cook at home, eat more fresh fruits and vegetables, and eat less fast food, canned, processed, or prepared foods. This information is not intended to replace advice given to you by your health care provider. Make sure you discuss any questions you have with your health care provider. Document Released: 10/09/2001 Document Revised: 04/12/2016 Document Reviewed: 04/12/2016 Elsevier Interactive Patient Education  2018 [place] With Less [employer] with less salt is one way to reduce the amount of sodium you get from food. Depending on your condition and overall health, your health care provider or diet and nutrition specialist (dietitian) may recommend that you reduce your sodium intake. Most people should have less than 2,300 milligrams (mg) of sodium each day. If you have high blood pressure (hypertension), you may need to limit your sodium to 1,500 mg each day. Follow the tips below to help reduce your sodium intake. What do I need to know about cooking with less salt? Shopping  Buy sodium-free or low-sodium products. Look for the following words on food  labels: ? Low-sodium. ? Sodium-free. ? Reduced-sodium. ? No salt added. ? Unsalted.  Buy fresh or frozen vegetables. Avoid canned vegetables.  Avoid buying meats or protein foods that have been injected with broth or saline solution.  Avoid cured or smoked meats, such as hot dogs, bacon, salami, ham, and bologna. Reading food labels  Check the food label before buying or using packaged ingredients.  Look for products with no more than 140 mg of sodium in one serving.  Do not choose foods with salt as one of the first three ingredients on the ingredients list. If salt is one of the first three ingredients, it usually means the item is high in sodium, because ingredients are listed in order of amount in the food item. Cooking  Use herbs, seasonings without salt, and spices as substitutes for salt in foods.  Use sodium-free baking soda when baking.  Grill, braise, or roast foods to add flavor with less salt.  Avoid adding salt to pasta, rice, or hot cereals while cooking.  Drain and rinse canned vegetables before use.  Avoid adding salt when cooking  sweets and desserts.  Cook with low-sodium ingredients. What are some salt alternatives? The following are herbs, seasonings, and spices that can be used instead of salt to give taste to your food. Herbs should be fresh or dried. Do not choose packaged mixes. Next to the name of the herb, spice, or seasoning are some examples of foods you can pair it with. Herbs  Bay leaves - Soups, meat and vegetable dishes, and spaghetti sauce.  Basil - [employer], soups, pasta, and fish dishes.  Cilantro - Meat, poultry, and vegetable dishes.  Chili powder - Marinades and Mexican dishes.  Chives - Salad dressings and potato dishes.  Cumin - Mexican dishes, couscous, and meat dishes.  Dill - Fish dishes, sauces, and salads.  Fennel - Meat and vegetable dishes, breads, and cookies.  Garlic (do not use garlic salt) - [place] dishes,  meat dishes, salad dressings, and sauces.  Marjoram - Soups, potato dishes, and meat dishes.  Oregano - Pizza and spaghetti sauce.  Parsley - Salads, soups, pasta, and meat dishes.  Rosemary - [place] dishes, salad dressings, soups, and red meats.  Saffron - Fish dishes, pasta, and some poultry dishes.  Sage - Stuffings and sauces.  Tarragon - Fish and [employer].  Thyme - Stuffing, meat, and fish dishes. Seasonings  Lemon juice - Fish dishes, poultry dishes, vegetables, and salads.  Vinegar - Salad dressings, vegetables, and fish dishes. Spices  Cinnamon - Sweet dishes, such as cakes, cookies, and puddings.  Cloves - Gingerbread, puddings, and marinades for meats.  Curry - Vegetable dishes, fish and poultry dishes, and stir-fry dishes.  Ginger - Vegetables dishes, fish dishes, and stir-fry dishes.  Nutmeg - Pasta, vegetables, poultry, fish dishes, and custard. What are some low-sodium ingredients and foods?  Fresh or frozen fruits and vegetables with no sauce added.  Fresh or frozen whole meats, poultry, and fish with no sauce added.  Eggs.  Noodles, pasta, quinoa, rice.  Shredded or puffed wheat or puffed rice.  Regular or quick oats.  Milk, yogurt, hard cheeses, and low-sodium cheeses. Good cheese choices include Swiss, [place]. Always check the label for the serving size and sodium content.  Unsalted butter or margarine.  Unsalted nuts.  Sherbet or ice cream (keep to  cup per serving).  Homemade pudding.  Sodium-free baking soda and baking powder. This is not a complete list of low-sodium ingredients and foods. Contact your dietitian for more options. Summary  Cooking with less salt is one way to reduce the amount of sodium that you get from food.  Buy sodium-free or low-sodium products.  Check the food label before using or buying packaged ingredients.  Use herbs, seasonings without salt, and spices as substitutes for  salt in foods. This information is not intended to replace advice given to you by your health care provider. Make sure you discuss any questions you have with your health care provider. Document Released: 04/19/2005 Document Revised: 04/27/2016 Document Reviewed: 04/27/2016 Elsevier Interactive Patient Education  2017 [place], Ermalinda Barrios, [place]  09/07/2017 11:18 AM    [facility] [place], [place],   [address] Phone: [phone]; Fax: [phone]

## 2017-09-08 ENCOUNTER — Telehealth: Payer: Self-pay | Admitting: Physician Assistant

## 2017-09-08 NOTE — Telephone Encounter (Signed)
New Message   Pt states she's she is taking the monitor off because she is allergic to the leads on the heart monitor, so she contact the monitor company and the new leads will not get to her until monday

## 2017-09-08 NOTE — Telephone Encounter (Signed)
LPMTCB 5/9

## 2017-09-09 NOTE — Telephone Encounter (Signed)
LPMTCB 5/10

## 2017-09-11 ENCOUNTER — Telehealth: Payer: Self-pay | Admitting: Student

## 2017-09-11 NOTE — Telephone Encounter (Signed)
   Received a call from [place]. Patient had a 4 second pause overnight. Has since maintained NSR and HR is currently in the 60's to 70's. Not currently on any AV nodal blocking agents. Asked for strips to be forwarded to the office for review. Will forward today's note to ordering provider. May need to consider repeat sleep study (intolerant to CPAP previously).   Signed, Erma Heritage, PA-C 09/11/2017, 9:30 AM Pager: [phone]

## 2017-09-12 ENCOUNTER — Telehealth: Payer: Self-pay

## 2017-09-12 NOTE — Telephone Encounter (Signed)
Spoke with patient because we received a monitor alert from her 30-day device.  On Sunday morning at 6:37am, she had a 4 second pause.  At the time, she was sleeping and asymptomatic.  Since then she has been reluctant to wear it because of a possible allergy.    I addressed it with her Cardiologist Dr Radford Pax, who said that it was vagally mediated.  She requests that the patient continues to wear the monitor.  The patient will call the company and request other EKG patches, which will hopefully not cause a skin irritation.

## 2017-09-19 ENCOUNTER — Telehealth: Payer: Self-pay | Admitting: Cardiology

## 2017-09-19 NOTE — Telephone Encounter (Signed)
Received event monitor report from [place].  Pt had 3.2 second pause 5/18 at 6:49A.  Spoke with pt and she states she was sleeping and did not feel anything.  Has felt fine all weekend.  Spoke with Dr. Radford Pax.  She said to have pt continue to wear monitor.  Pt c/o breaking out where monitor leads are placed.  Advised pt she can put Maalox where leads go and that should put a barrier between skin and leads to alleviate the rash.  Pt verbalized understanding and was appreciative for call.

## 2017-09-28 ENCOUNTER — Other Ambulatory Visit: Payer: Self-pay | Admitting: Family Medicine

## 2017-09-28 DIAGNOSIS — N6459 Other signs and symptoms in breast: Secondary | ICD-10-CM

## 2017-09-30 ENCOUNTER — Other Ambulatory Visit: Payer: Self-pay | Admitting: Family Medicine

## 2017-09-30 DIAGNOSIS — N6459 Other signs and symptoms in breast: Secondary | ICD-10-CM

## 2017-10-03 ENCOUNTER — Other Ambulatory Visit: Payer: Self-pay | Admitting: Family Medicine

## 2017-10-03 DIAGNOSIS — N6320 Unspecified lump in the left breast, unspecified quadrant: Secondary | ICD-10-CM

## 2017-10-07 ENCOUNTER — Other Ambulatory Visit: Payer: Medicaid Other

## 2017-10-07 ENCOUNTER — Ambulatory Visit
Admission: RE | Admit: 2017-10-07 | Discharge: 2017-10-07 | Disposition: A | Discharge status: Home / Self Care | Payer: Medicaid Other | Source: Ambulatory Visit | Attending: Family Medicine | Admitting: Family Medicine

## 2017-10-07 DIAGNOSIS — N6320 Unspecified lump in the left breast, unspecified quadrant: Secondary | ICD-10-CM

## 2017-10-12 ENCOUNTER — Other Ambulatory Visit: Payer: Medicaid Other

## 2017-10-25 ENCOUNTER — Telehealth: Payer: Self-pay

## 2017-10-25 DIAGNOSIS — G4733 Obstructive sleep apnea (adult) (pediatric): Secondary | ICD-10-CM

## 2017-10-25 NOTE — Telephone Encounter (Signed)
Pt called back in regards to cardiac event monitor results. Pt made aware of event monitor results. Per Dr. Radford Pax it also showed transient complete heart block with 4-second pauses with heart rates as low as 30 bpm likely while she is asleep. This occurred around 6:49 AM.  Pt states she was probably sleep during that time. She normally wakes up around 8 or 9AM. She denies any new symptoms and she is not using her CPAP machine because she can not tolerant using the mask. Informed pt I will forward to Dr. Radford Pax to review. She stated understanding and thankful for the call

## 2017-10-25 NOTE — Telephone Encounter (Signed)
Please get an overnight pulse ox.  She has OSA but was intolerant to CPAP.  If she is having nocturnal hypoxemia then will refer to ENT to consider [facility]

## 2017-10-26 NOTE — Telephone Encounter (Signed)
Overnight pulse ox faxed today to [facility]

## 2017-11-04 ENCOUNTER — Telehealth: Payer: Self-pay | Admitting: *Deleted

## 2017-11-04 DIAGNOSIS — G4733 Obstructive sleep apnea (adult) (pediatric): Secondary | ICD-10-CM

## 2017-11-04 NOTE — Telephone Encounter (Signed)
Per Dr Haydee Monica ordered

## 2017-11-25 ENCOUNTER — Telehealth: Payer: Self-pay | Admitting: *Deleted

## 2017-11-25 DIAGNOSIS — G4734 Idiopathic sleep related nonobstructive alveolar hypoventilation: Secondary | ICD-10-CM

## 2017-11-25 NOTE — Telephone Encounter (Signed)
Notes recorded by Sueanne Margarita, MD on 11/22/2017 at 1:18 PM EDT  Patient continues to have nocturnal hypoxemia with OSA and is intolerant to CPAP. Please order 2L [place] O2 at night and refer to Dr. Wilburn Cornelia with ENT for [facility] device. Repeat pulse ox overnight once on O2

## 2017-11-25 NOTE — Telephone Encounter (Signed)
Pt is aware and agreeable to using 02 at night and states she already uses 2L [place] of 02 nightly. Pt is aware and agreeable to a repeat pulse ox overnight on 02.  Referral to Dr Wilburn Cornelia sent.

## 2017-11-30 NOTE — Telephone Encounter (Signed)
repeat pulse ox overnight on 02 on 2L [place]  Order faxed to [facility]

## 2017-11-30 NOTE — Addendum Note (Signed)
Addended by: Freada Bergeron on: 11/30/2017 01:14 PM   Modules accepted: Orders

## 2017-12-22 ENCOUNTER — Ambulatory Visit
Admission: RE | Admit: 2017-12-22 | Discharge: 2017-12-22 | Disposition: A | Discharge status: Home / Self Care | Payer: Medicaid Other | Source: Ambulatory Visit | Attending: Family Medicine | Admitting: Family Medicine

## 2017-12-22 DIAGNOSIS — N6320 Unspecified lump in the left breast, unspecified quadrant: Secondary | ICD-10-CM

## 2018-01-09 ENCOUNTER — Telehealth: Payer: Self-pay | Admitting: *Deleted

## 2018-01-09 NOTE — Telephone Encounter (Signed)
Informed patient of overnight oximetry results and patient understanding was verbalized. Patient understands her overnight oximetry showed she had no nocturnal hypoxemia and no need therefore to refer for [facility]. Patient states she needs something else so she can sleep at night she is waking up 3 to 4 times a night. Patient states her energy level is down also.

## 2018-01-09 NOTE — Telephone Encounter (Signed)
Needs to see her PCP for treatment of insomnia

## 2018-01-10 NOTE — Telephone Encounter (Signed)
Pt advised to talk with her PMD Dr. Coletta Memos re: her problem with insomnia. Pt agrees and plans to call him this week.

## 2018-02-15 ENCOUNTER — Other Ambulatory Visit: Payer: Self-pay | Admitting: Family Medicine

## 2018-02-15 DIAGNOSIS — Z1231 Encounter for screening mammogram for malignant neoplasm of breast: Secondary | ICD-10-CM

## 2018-03-21 ENCOUNTER — Encounter: Payer: Self-pay | Admitting: Physician Assistant

## 2018-03-28 ENCOUNTER — Ambulatory Visit: Payer: Medicaid Other | Admitting: Physician Assistant

## 2018-04-19 ENCOUNTER — Encounter: Payer: Self-pay | Admitting: Physician Assistant

## 2018-04-19 NOTE — Progress Notes (Signed)
Cardiology Office Note    Date:  04/20/2018  ID:  [name], DOB [birth year], MRN [id number] PCP:  Bernerd Limbo, MD  Cardiologist:  Fransico Him, MD   Chief Complaint: f/u palpitations, bradycardia, HTN  History of Present Illness:  Robin Clayton is a 62 y.o. female with history of hypertension, bradycardia, morbid obesity, CKD III by labs, COPD (followed by pulm), OSA intolerant to CPAP (on nocturnal O2), atrial tachycardia, transient complete heart block who presents for 6 month follow-up. She has history of palpitations with event monitor in 10/2017 demonstrating nonsustained atrial tachycardia up to 25 beats, sinus bradycardia to sinus tachycardia (30->130bpm), transient complete heart block with ventricular standstill for up to 4 seconds. It sounds like from phone notes those episodes were asymptomatic but patient was actually not able to be reached for the end of the event monitor in order to finalize recommendations from Dr. Radford Pax. Last labs 08/2017 showed K 4.7, Cr 1.23; last CBC 2015 Hgb wnl, AST/ALT wnl in 2014. No recent lipids or TSH on file.  She returns for follow-up today feeling good the last week. However about 2 weeks ago she developed intermittent racing heart over the course of a week similar to the palpitations that prompted event monitor. This would last a few minutes all the way to all day long. Does not have any means to check HR at that time. Sometimes it felt like she had to belch and this would resolve her tachycardia. Sitting up also helped. No syncope, dizziness. She has rare sharp catching chest pain under L breast particularly associated with bending forward. It lasts a few seconds. It does not recur with exertion. She reports a chronic white phlegm cough. No fevers or chills.  Past Medical History:  Diagnosis Date  . Atrial tachycardia ([place])   . Bradycardia   . Chronic bronchitis ([place])   . Chronic lower back pain   . CKD (chronic kidney disease), stage III ([place])    . Complete heart block ([facility])    a. event monitor in 10/2017 demonstrating nonsustained atrial tachycardia up to 25 beats, sinus bradycardia to sinus tachycardia (30->130bpm), transient complete heart block with ventricular standstill for up to 4 seconds.  . Constipation   . COPD (chronic obstructive pulmonary disease) ([place])   . GERD (gastroesophageal reflux disease)    takes tums occ  . Hypertension   . Morbid obesity ([place])   . Obstructive sleep apnea    moderate OSA with AHI 24/hr intolerant to CPAP and could not afford oral device  . Type II diabetes mellitus ([place])     Past Surgical History:  Procedure Laterality Date  . BREAST BIOPSY  12/06/2011   Procedure: BREAST BIOPSY WITH NEEDLE LOCALIZATION;  Surgeon: Rolm Bookbinder, MD;  Location: [facility];  Service: General;  Laterality: Left;  . CESAREAN SECTION  1970    Current Medications: Current Meds  Medication Sig  . albuterol (PROAIR HFA) 108 (90 BASE) MCG/ACT inhaler Inhale 2 puffs into the lungs every 6 (six) hours as needed for wheezing.  Marland Kitchen albuterol (PROVENTIL) (2.5 MG/3ML) 0.083% nebulizer solution Take 2.5 mg by nebulization every 4 (four) hours as needed (shortness of breath).   Marland Kitchen amLODipine (NORVASC) 10 MG tablet Take 10 mg by mouth daily.  Marland Kitchen aspirin 81 MG tablet Take 81 mg by mouth daily.  Marland Kitchen atorvastatin (LIPITOR) 10 MG tablet Take 10 mg by mouth every evening.   . budesonide-formoterol (SYMBICORT) 160-4.5 MCG/ACT inhaler Take 2 puffs first thing in am and  then another 2 puffs about 12 hours later.  . cloNIDine (CATAPRES) 0.2 MG tablet Take 0.2 mg by mouth daily.   . famotidine (PEPCID) 20 MG tablet Take 1 tablet (20 mg total) by mouth at bedtime.  . gabapentin (NEURONTIN) 100 MG capsule Take by mouth.  . hydrochlorothiazide (MICROZIDE) 12.5 MG capsule Take 1 capsule by mouth daily.  Marland Kitchen losartan-hydrochlorothiazide (HYZAAR) 50-12.5 MG tablet Take 1 tablet by mouth daily.  . metFORMIN (GLUCOPHAGE) 500 MG tablet Take 500 mg  by mouth 2 (two) times daily.  . naproxen sodium (ALEVE) 220 MG tablet Take 220 mg by mouth as needed (PAIN).   Marland Kitchen pantoprazole (PROTONIX) 40 MG tablet Take 1 tablet (40 mg total) by mouth daily.      Allergies:   Wound dressings   Social History   Socioeconomic History  . Marital status: Single    Spouse name: Not on file  . Number of children: 7  . Years of education: Not on file  . Highest education level: Not on file  Occupational History  . Occupation: Disabled  Social Needs  . Financial resource strain: Not on file  . Food insecurity:    Worry: Not on file    Inability: Not on file  . Transportation needs:    Medical: Not on file    Non-medical: Not on file  Tobacco Use  . Smoking status: Former Smoker    Packs/day: 0.50    Years: 38.00    Pack years: 19.00    Types: Cigarettes    Last attempt to quit: 05/03/2014    Years since quitting: 3.9  . Smokeless tobacco: Never Used  Substance and Sexual Activity  . Alcohol use: No  . Drug use: No  . Sexual activity: Never  Lifestyle  . Physical activity:    Days per week: Not on file    Minutes per session: Not on file  . Stress: Not on file  Relationships  . Social connections:    Talks on phone: Not on file    Gets together: Not on file    Attends religious service: Not on file    Active member of club or organization: Not on file    Attends meetings of clubs or organizations: Not on file    Relationship status: Not on file  Other Topics Concern  . Not on file  Social History Narrative  . Not on file     Family History:  The patient's family history includes Asthma in her sister; Cancer in her mother; Diabetes in her father and mother; Heart attack in her father; Hyperlipidemia in her mother; Hypertension in her father and mother; Stomach cancer (age of onset: 75) in her mother. There is no history of Colon cancer.  ROS:   Please see the history of present illness. All other systems are reviewed and  otherwise negative.    PHYSICAL EXAM:   VS:  BP 122/68   Pulse 80   Ht 5\' 4"  (1.626 m)   Wt 204 lb 1.9 oz (92.6 kg)   SpO2 94%   BMI 35.04 kg/m   BMI: Body mass index is 35.04 kg/m. GEN: Well nourished, well developed morbidly obese AAF, in no acute distress HEENT: normocephalic, atraumatic Neck: no JVD, carotid bruits, or masses Cardiac:RRR; no murmurs, rubs, or gallops, no edema  Respiratory: diffusely diminished throughout, normal work of breathing GI: soft, nontender, nondistended, + BS MS: no deformity or atrophy Skin: warm and dry, no rash Neuro:  Alert  and Oriented x 3, Strength and sensation are intact, follows commands Psych: euthymic mood, full affect  Wt Readings from Last 3 Encounters:  04/20/18 204 lb 1.9 oz (92.6 kg)  09/07/17 194 lb (88 kg)  08/19/17 192 lb (87.1 kg)      Studies/Labs Reviewed:   EKG:  EKG was ordered today and personally reviewed by me and demonstrates NSR 82bpm with left axis deviation, low voltage QRS, prior septal infarct, age undetermined  Recent Labs: 08/19/2017: BUN 15; Creatinine, Ser 1.23; Potassium 4.7; Sodium 139   Lipid Panel    Component Value Date/Time   CHOL  01/05/2010 0355    184        ATP III CLASSIFICATION:  <200     mg/dL   Desirable  200-239  mg/dL   Borderline High  >=240    mg/dL   High          TRIG 62 01/05/2010 0355   HDL 57 01/05/2010 0355   CHOLHDL 3.2 01/05/2010 0355   VLDL 12 01/05/2010 0355   LDLCALC (H) 01/05/2010 0355    115        Total Cholesterol/HDL:CHD Risk Coronary Heart Disease Risk Table                     Men   Women  1/2 Average Risk   3.4   3.3  Average Risk       5.0   4.4  2 X Average Risk   9.6   7.1  3 X Average Risk  23.4   11.0        Use the calculated Patient Ratio above and the CHD Risk Table to determine the patient's CHD Risk.        ATP III CLASSIFICATION (LDL):  <100     mg/dL   Optimal  100-129  mg/dL   Near or Above                    Optimal  130-159  mg/dL    Borderline  160-189  mg/dL   High  >190     mg/dL   Very High    Additional studies/ records that were reviewed today include: Summarized above  ASSESSMENT & PLAN:   1. Palpitations/atrial tachycardia, but also with bradycardia as well - recently recurred. Not sure what to make of the associated belching. She does not get associated angina with this. She has chronic unchanged DOE, not particularly worse with the palpitations. I will check labs today and would like her to see EP for evaluation of tachy-brady syndrome. Check baseline echocardiogram. 2. Atypical sharp R chest pain - does not sound like angina. Particularly exacerbated by movements bending forward. Will start with echocardiogram to evaluate. 3. Essential HTN - controlled. Interestingly she is on clonidine. This could potentially be switched to a trial of an additional agent to address palpitations, but I would prefer EP's input before switching given her h/o nocturnal CHB. This is likely related to her OSA, but difficult to treat tachycardia if the sleep apnea cannot be adequately treated due to her intolerance of CPAP. 4. OSA in setting of morbid obesity - intolerant of CPAP. Uses nocturnal O2. Weight loss encouraged.  Disposition: F/u with EP to eval tachy-brady syndrome, and Dr. Radford Pax otherwise in 6 months, sooner if echo is abnormal. Encouraged her to f/u with pulm for ongoing chronic cough and COPD.  Medication Adjustments/Labs and Tests Ordered: Current medicines are reviewed  at length with the patient today.  Concerns regarding medicines are outlined above. Medication changes, Labs and Tests ordered today are summarized above and listed in the Patient Instructions accessible in Encounters.   Signed, Charlie Pitter, PA-C  04/20/2018 3:26 PM    [facility] [address], [place], [place]  [address] Phone: [phone]; Fax: [phone]

## 2018-04-20 ENCOUNTER — Encounter (INDEPENDENT_AMBULATORY_CARE_PROVIDER_SITE_OTHER): Payer: Self-pay

## 2018-04-20 ENCOUNTER — Ambulatory Visit (INDEPENDENT_AMBULATORY_CARE_PROVIDER_SITE_OTHER): Payer: Medicaid Other | Admitting: Physician Assistant

## 2018-04-20 ENCOUNTER — Encounter: Payer: Self-pay | Admitting: Physician Assistant

## 2018-04-20 VITALS — BP 122/68 | HR 80 | Ht 64.0 in | Wt 204.1 lb

## 2018-04-20 DIAGNOSIS — G4733 Obstructive sleep apnea (adult) (pediatric): Secondary | ICD-10-CM

## 2018-04-20 DIAGNOSIS — I471 Supraventricular tachycardia: Secondary | ICD-10-CM | POA: Diagnosis not present

## 2018-04-20 DIAGNOSIS — I10 Essential (primary) hypertension: Secondary | ICD-10-CM | POA: Diagnosis not present

## 2018-04-20 DIAGNOSIS — R002 Palpitations: Secondary | ICD-10-CM | POA: Diagnosis not present

## 2018-04-20 DIAGNOSIS — R001 Bradycardia, unspecified: Secondary | ICD-10-CM | POA: Diagnosis not present

## 2018-04-20 NOTE — Patient Instructions (Addendum)
Medication Instructions:  Your physician recommends that you continue on your current medications as directed. Please refer to the Current Medication list given to you today.  If you need a refill on your cardiac medications before your next appointment, please call your pharmacy.   Lab work: BMET, MAG, CBC, & TSH  If you have labs (blood work) drawn today and your tests are completely normal, you will receive your results only by: Marland Kitchen MyChart Message (if you have MyChart) OR . A paper copy in the mail If you have any lab test that is abnormal or we need to change your treatment, we will call you to review the results.  Testing/Procedures: Your physician has requested that you have an echocardiogram. Echocardiography is a painless test that uses sound waves to create images of your heart. It provides your doctor with information about the size and shape of your heart and how well your heart's chambers and valves are working. This procedure takes approximately one hour. There are no restrictions for this procedure.  You have been referred to one of our Electrophysiology Specialists (EP).   Follow-Up: At [facility], you and your health needs are our priority.  As part of our continuing mission to provide you with exceptional heart care, we have created designated Provider Care Teams.  These Care Teams include your primary Cardiologist (physician) and Advanced Practice Providers (APPs -  Physician Assistants and Nurse Practitioners) who all work together to provide you with the care you need, when you need it. You will need a follow up appointment in 6 months.  Please call our office 2 months in advance to schedule this appointment.  You may see Fransico Him, MD or one of the following Advanced Practice Providers on your designated Care Team:   [place], PA-C Melina Copa, PA-C . Ermalinda Barrios, PA-C  Any Other Special Instructions Will Be Listed Below (If  Applicable).  Echocardiogram An echocardiogram is a procedure that uses painless sound waves (ultrasound) to produce an image of the heart. Images from an echocardiogram can provide important information about:  Signs of coronary artery disease (CAD).  Aneurysm detection. An aneurysm is a weak or damaged part of an artery wall that bulges out from the normal force of blood pumping through the body.  Heart size and shape. Changes in the size or shape of the heart can be associated with certain conditions, including heart failure, aneurysm, and CAD.  Heart muscle function.  Heart valve function.  Signs of a past heart attack.  Fluid buildup around the heart.  Thickening of the heart muscle.  A tumor or infectious growth around the heart valves. Tell a health care provider about:  Any allergies you have.  All medicines you are taking, including vitamins, herbs, eye drops, creams, and over-the-counter medicines.  Any blood disorders you have.  Any surgeries you have had.  Any medical conditions you have.  Whether you are pregnant or may be pregnant. What are the risks? Generally, this is a safe procedure. However, problems may occur, including:  Allergic reaction to dye (contrast) that may be used during the procedure. What happens before the procedure? No specific preparation is needed. You may eat and drink normally. What happens during the procedure?   An IV tube may be inserted into one of your veins.  You may receive contrast through this tube. A contrast is an injection that improves the quality of the pictures from your heart.  A gel will be applied to  your chest.  A wand-like tool (transducer) will be moved over your chest. The gel will help to transmit the sound waves from the transducer.  The sound waves will harmlessly bounce off of your heart to allow the heart images to be captured in real-time motion. The images will be recorded on a computer. The  procedure may vary among health care providers and hospitals. What happens after the procedure?  You may return to your normal, everyday life, including diet, activities, and medicines, unless your health care provider tells you not to do that. Summary  An echocardiogram is a procedure that uses painless sound waves (ultrasound) to produce an image of the heart.  Images from an echocardiogram can provide important information about the size and shape of your heart, heart muscle function, heart valve function, and fluid buildup around your heart.  You do not need to do anything to prepare before this procedure. You may eat and drink normally.  After the echocardiogram is completed, you may return to your normal, everyday life, unless your health care provider tells you not to do that. This information is not intended to replace advice given to you by your health care provider. Make sure you discuss any questions you have with your health care provider. Document Released: 04/16/2000 Document Revised: 05/22/2016 Document Reviewed: 05/22/2016 Elsevier Interactive Patient Education  2019 [employer].

## 2018-04-21 ENCOUNTER — Telehealth: Payer: Self-pay | Admitting: *Deleted

## 2018-04-21 DIAGNOSIS — Z79899 Other long term (current) drug therapy: Secondary | ICD-10-CM

## 2018-04-21 LAB — BASIC METABOLIC PANEL
BUN/Creatinine Ratio: 11 — ABNORMAL LOW (ref 12–28)
BUN: 13 mg/dL (ref 8–27)
CALCIUM: 10.4 mg/dL — AB (ref 8.7–10.3)
CHLORIDE: 98 mmol/L (ref 96–106)
CO2: 22 mmol/L (ref 20–29)
CREATININE: 1.2 mg/dL — AB (ref 0.57–1.00)
GFR calc Af Amer: 56 mL/min/{1.73_m2} — ABNORMAL LOW (ref >59)
GFR calc non Af Amer: 49 mL/min/{1.73_m2} — ABNORMAL LOW (ref >59)
Glucose: 102 mg/dL — ABNORMAL HIGH (ref 65–99)
Potassium: 4.3 mmol/L (ref 3.5–5.2)
Sodium: 139 mmol/L (ref 134–144)

## 2018-04-21 LAB — CBC
HEMATOCRIT: 42 % (ref 34.0–46.6)
HEMOGLOBIN: 13.4 g/dL (ref 11.1–15.9)
MCH: 26 pg — ABNORMAL LOW (ref 26.6–33.0)
MCHC: 31.9 g/dL (ref 31.5–35.7)
MCV: 82 fL (ref 79–97)
Platelets: 238 10*3/uL (ref 150–450)
RBC: 5.15 x10E6/uL (ref 3.77–5.28)
RDW: 14.5 % (ref 12.3–15.4)
WBC: 6.6 10*3/uL (ref 3.4–10.8)

## 2018-04-21 LAB — TSH: TSH: 1.52 u[IU]/mL (ref 0.450–4.500)

## 2018-04-21 LAB — MAGNESIUM: Magnesium: 1.7 mg/dL (ref 1.6–2.3)

## 2018-04-21 MED ORDER — LOSARTAN POTASSIUM 50 MG PO TABS: 50.0000 mg | ORAL_TABLET | Freq: Every day | ORAL | 3 refills | Status: DC

## 2018-04-21 MED ORDER — MAGNESIUM OXIDE 400 MG PO TABS: 400.0000 mg | ORAL_TABLET | Freq: Every day | ORAL | 11 refills | Status: DC

## 2018-04-21 NOTE — Telephone Encounter (Signed)
-----   Message from Charlie Pitter, [place] sent at 04/21/2018 12:37 PM EST ----- Please call patient. Labs are generally stable. Calcium is mildly elevated, please double check diuretic regimen - she has two versions of HCTZ, one combo and one solo listed on chart. Would hold any calcium supplements she is taking and f/u PCP for this. Mg level is suboptimal for people with rhythm issues. Suggest MagOx 400mg  daily with recheck Mg in 7-10 days. If magnesium remains low at that time, Protonix can cause low magnesium level so she may need to discuss with the provider that prescribes this. Dayna Dunn PA-C

## 2018-04-21 NOTE — Telephone Encounter (Signed)
Patient returning call.

## 2018-04-21 NOTE — Telephone Encounter (Signed)
Called pt re: lab results below.  Left a message for pt to call back.

## 2018-05-02 ENCOUNTER — Ambulatory Visit (HOSPITAL_COMMUNITY): Payer: Medicaid Other | Attending: Cardiology

## 2018-05-02 ENCOUNTER — Other Ambulatory Visit: Payer: Medicaid Other | Admitting: *Deleted

## 2018-05-02 DIAGNOSIS — I471 Supraventricular tachycardia: Secondary | ICD-10-CM | POA: Insufficient documentation

## 2018-05-02 DIAGNOSIS — I10 Essential (primary) hypertension: Secondary | ICD-10-CM | POA: Diagnosis not present

## 2018-05-02 DIAGNOSIS — R001 Bradycardia, unspecified: Secondary | ICD-10-CM | POA: Diagnosis present

## 2018-05-02 DIAGNOSIS — Z79899 Other long term (current) drug therapy: Secondary | ICD-10-CM

## 2018-05-03 LAB — MAGNESIUM: Magnesium: 2.2 mg/dL (ref 1.6–2.3)

## 2018-05-12 ENCOUNTER — Encounter: Payer: Self-pay | Admitting: Internal Medicine

## 2018-05-12 ENCOUNTER — Ambulatory Visit (INDEPENDENT_AMBULATORY_CARE_PROVIDER_SITE_OTHER): Payer: Medicaid Other | Admitting: Internal Medicine

## 2018-05-12 VITALS — BP 168/90 | HR 76 | Ht 64.0 in | Wt 208.6 lb

## 2018-05-12 DIAGNOSIS — R002 Palpitations: Secondary | ICD-10-CM | POA: Diagnosis not present

## 2018-05-12 DIAGNOSIS — I471 Supraventricular tachycardia: Secondary | ICD-10-CM | POA: Diagnosis not present

## 2018-05-12 DIAGNOSIS — I10 Essential (primary) hypertension: Secondary | ICD-10-CM

## 2018-05-12 NOTE — Progress Notes (Signed)
ELECTROPHYSIOLOGY CONSULT NOTE  Patient ID: Robin Clayton, MRN: [id number], DOB/AGE: [birth year] 63 y.o. Admit date: (Not on file) Date of Consult: 05/12/2018  Primary Physician: Bernerd Limbo, MD Primary Cardiologist: TT     Robin Clayton is a 63 y.o. female who is being seen today for the evaluation of atrial tach at the request of dr TT.    HPI Robin Clayton is a 63 y.o. female is referred because of atrial tachycardia recorded in the notes and a history of palpitations.  She describes palpitations as intermittently provoked by exertion.  Her heartbeats fast and hard.  Associated with some shortness of breath.  She has had 2 monitors which I was able to review personally, one in 2012 and one more recently.  Both showed significant nocturnal pausing associated with hyper vagotonia as manifested by PP prolongation and concomitant AV block.  Neither showed atrial tachycardia  She is aware of the severity of her sleep apnea.  She has tried CPAP.  She apparently does not have insurance coverage for an oral appliance.  She is currently talking to ENT.  Her her second palpitation syndrome is fleeting.  Episodes last 1-2 seconds.  Causes her to catch her breath.  They are frightening.  She has modest dyspnea on exertion.  She has severe hypertension with chronic kidney disease on a complex medical regime   DATE TEST EF   9/12 Echo   60 % Normal Wall Thickness  9/19 Echo   NOT YET READ %            Past Medical History:  Diagnosis Date  . Atrial tachycardia ([place])   . Bradycardia   . Chronic bronchitis ([place])   . Chronic lower back pain   . CKD (chronic kidney disease), stage III ([place])   . Complete heart block ([facility])    a. event monitor in 10/2017 demonstrating nonsustained atrial tachycardia up to 25 beats, sinus bradycardia to sinus tachycardia (30->130bpm), transient complete heart block with ventricular standstill for up to 4 seconds.  . Constipation   . COPD (chronic  obstructive pulmonary disease) ([place])   . GERD (gastroesophageal reflux disease)    takes tums occ  . Hypertension   . Morbid obesity ([place])   . Obstructive sleep apnea    moderate OSA with AHI 24/hr intolerant to CPAP and could not afford oral device  . Type II diabetes mellitus ([place])       Surgical History:  Past Surgical History:  Procedure Laterality Date  . BREAST BIOPSY  12/06/2011   Procedure: BREAST BIOPSY WITH NEEDLE LOCALIZATION;  Surgeon: Rolm Bookbinder, MD;  Location[place] [facility];  Service: General;  Laterality: Left;  . CESAREAN SECTION  1970     Home Meds: Current Meds  Medication Sig  . albuterol (PROAIR HFA) 108 (90 BASE) MCG/ACT inhaler Inhale 2 puffs into the lungs every 6 (six) hours as needed for wheezing.  Marland Kitchen albuterol (PROVENTIL) (2.5 MG/3ML) 0.083% nebulizer solution Take 2.5 mg by nebulization every 4 (four) hours as needed (shortness of breath).   Marland Kitchen amLODipine (NORVASC) 10 MG tablet Take 10 mg by mouth daily.  Marland Kitchen aspirin 81 MG tablet Take 81 mg by mouth daily.  Marland Kitchen atorvastatin (LIPITOR) 10 MG tablet Take 10 mg by mouth every evening.   . budesonide-formoterol (SYMBICORT) 160-4.5 MCG/ACT inhaler Take 2 puffs first thing in am and then another 2 puffs about 12 hours later.  . cloNIDine (CATAPRES) 0.2 MG tablet Take 0.2 mg by  mouth daily.   . famotidine (PEPCID) 20 MG tablet Take 1 tablet (20 mg total) by mouth at bedtime.  . gabapentin (NEURONTIN) 100 MG capsule Take by mouth.  . hydrochlorothiazide (MICROZIDE) 12.5 MG capsule Take 1 capsule by mouth daily.  Marland Kitchen losartan (COZAAR) 50 MG tablet Take 1 tablet (50 mg total) by mouth daily.  . magnesium oxide (MAG-OX) 400 MG tablet Take 1 tablet (400 mg total) by mouth daily.  . metFORMIN (GLUCOPHAGE) 500 MG tablet Take 500 mg by mouth 2 (two) times daily.  . naproxen sodium (ALEVE) 220 MG tablet Take 220 mg by mouth as needed (PAIN).     Allergies:  Allergies  Allergen Reactions  . Wound Dressings Swelling    Social  History   Socioeconomic History  . Marital status: Single    Spouse name: Not on file  . Number of children: 7  . Years of education: Not on file  . Highest education level: Not on file  Occupational History  . Occupation: Disabled  Social Needs  . Financial resource strain: Not on file  . Food insecurity:    Worry: Not on file    Inability: Not on file  . Transportation needs:    Medical: Not on file    Non-medical: Not on file  Tobacco Use  . Smoking status: Former Smoker    Packs/day: 0.50    Years: 38.00    Pack years: 19.00    Types: Cigarettes    Last attempt to quit: 05/03/2014    Years since quitting: 4.0  . Smokeless tobacco: Never Used  Substance and Sexual Activity  . Alcohol use: No  . Drug use: No  . Sexual activity: Never  Lifestyle  . Physical activity:    Days per week: Not on file    Minutes per session: Not on file  . Stress: Not on file  Relationships  . Social connections:    Talks on phone: Not on file    Gets together: Not on file    Attends religious service: Not on file    Active member of club or organization: Not on file    Attends meetings of clubs or organizations: Not on file    Relationship status: Not on file  . Intimate partner violence:    Fear of current or ex partner: Not on file    Emotionally abused: Not on file    Physically abused: Not on file    Forced sexual activity: Not on file  Other Topics Concern  . Not on file  Social History Narrative  . Not on file     Family History  Problem Relation Age of Onset  . Stomach cancer Mother 66  . Hypertension Mother   . Hyperlipidemia Mother   . Diabetes Mother   . Cancer Mother        stomach  . Hypertension Father   . Diabetes Father   . Heart attack Father   . Asthma Sister   . Colon cancer Neg Hx      ROS:  Please see the history of present illness.     All other systems reviewed and negative.    Physical Exam:  Blood pressure (!) 168/90, pulse 76, height 5\' 4"   (1.626 m), weight 208 lb 9.6 oz (94.6 kg). General: Well developed, well nourished female in no acute distress. Head: Normocephalic, atraumatic, sclera non-icteric, no xanthomas, nares are without discharge. EENT: normal  Lymph Nodes:  none Neck: Negative for carotid  bruits. JVD not elevated. Back:without scoliosis kyphosis Lungs: Clear bilaterally to auscultation without wheezes, rales, or rhonchi. Breathing is unlabored. Heart: RRR with S1 S2. 2/6 systolic murmur . No rubs, or gallops appreciated. Abdomen: Soft, non-tender, non-distended with normoactive bowel sounds. No hepatomegaly. No rebound/guarding. No obvious abdominal masses. Msk:  Strength and tone appear normal for age. Extremities: No clubbing or cyanosis. No  edema.  Distal pedal pulses are 2+ and equal bilaterally. Skin: Warm and Dry Neuro: Alert and oriented X 3. CN III-XII intact Grossly normal sensory and motor function . Psych:  Responds to questions appropriately with a normal affect.      Labs: Cardiac Enzymes No results for input(s): CKTOTAL, CKMB, TROPONINI in the last 72 hours. CBC Lab Results  Component Value Date   WBC 6.6 04/20/2018   HGB 13.4 04/20/2018   HCT 42.0 04/20/2018   MCV 82 04/20/2018   PLT 238 04/20/2018   PROTIME: No results for input(s): LABPROT, INR in the last 72 hours. Chemistry No results for input(s): NA, K, CL, CO2, BUN, CREATININE, CALCIUM, PROT, BILITOT, ALKPHOS, ALT, AST, GLUCOSE in the last 168 hours.  Invalid input(s): LABALBU Lipids Lab Results  Component Value Date   CHOL  01/05/2010    184        ATP III CLASSIFICATION:  <200     mg/dL   Desirable  200-239  mg/dL   Borderline High  >=240    mg/dL   High          HDL 57 01/05/2010   LDLCALC (H) 01/05/2010    115        Total Cholesterol/HDL:CHD Risk Coronary Heart Disease Risk Table                     Men   Women  1/2 Average Risk   3.4   3.3  Average Risk       5.0   4.4  2 X Average Risk   9.6   7.1  3 X  Average Risk  23.4   11.0        Use the calculated Patient Ratio above and the CHD Risk Table to determine the patient's CHD Risk.        ATP III CLASSIFICATION (LDL):  <100     mg/dL   Optimal  100-129  mg/dL   Near or Above                    Optimal  130-159  mg/dL   Borderline  160-189  mg/dL   High  >190     mg/dL   Very High   TRIG 62 01/05/2010   BNP Pro B Natriuretic peptide (BNP)  Date/Time Value Ref Range Status  10/03/2012 10:00 PM 35.9 0 - 125 pg/mL Final  01/06/2010 11:27 AM 72.0 0.0 - 100.0 pg/mL Final   Thyroid Function Tests: No results for input(s): TSH, T4TOTAL, T3FREE, THYROIDAB in the last 72 hours.  Invalid input(s): FREET3 Miscellaneous Lab Results  Component Value Date   DDIMER  01/04/2010    <0.22        AT THE INHOUSE ESTABLISHED CUTOFF VALUE OF 0.48 ug/mL FEU, THIS ASSAY HAS BEEN DOCUMENTED IN THE LITERATURE TO HAVE A SENSITIVITY AND NEGATIVE PREDICTIVE VALUE OF AT LEAST 98 TO 99%.  THE TEST RESULT SHOULD BE CORRELATED WITH AN ASSESSMENT OF THE CLINICAL PROBABILITY OF DVT / VTE.    Radiology/Studies:  No results found.  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  Given its sporadic nature, using an implantable loop recorder may be the only way to elucidate the mechanism.  Alternatively, modifying her antihypertensive regime by using diltiazem/verapamil instead of amlodipine might allow for symptom relief.  We do not have a recent echo report.  Assuming that her LV function is normal, the above option makes sense and also was important for reassurance as fear of her ectopy is her great motivator.  The echo will also be  important to review given that she has low voltage in her limb leads.  I am surprised that he does not have more repolarization abnormalities given the degree of her hypertension and would await to see what her left ventricular wall thickness looks like and whether this African-American woman amyloid needs to be considered.  She is also salt added in her diet.  I tried to stress to her the importance of salt reduction.    Virl Axe

## 2018-12-12 LAB — HM PAP SMEAR

## 2018-12-18 ENCOUNTER — Encounter (INDEPENDENT_AMBULATORY_CARE_PROVIDER_SITE_OTHER): Payer: Self-pay

## 2018-12-18 ENCOUNTER — Ambulatory Visit (INDEPENDENT_AMBULATORY_CARE_PROVIDER_SITE_OTHER): Payer: Medicaid Other | Admitting: Internal Medicine

## 2018-12-18 ENCOUNTER — Other Ambulatory Visit: Payer: Self-pay

## 2018-12-18 ENCOUNTER — Encounter: Payer: Self-pay | Admitting: Internal Medicine

## 2018-12-18 VITALS — BP 126/70 | HR 93 | Ht 64.0 in | Wt 203.0 lb

## 2018-12-18 DIAGNOSIS — I10 Essential (primary) hypertension: Secondary | ICD-10-CM

## 2018-12-18 DIAGNOSIS — I471 Supraventricular tachycardia: Secondary | ICD-10-CM

## 2018-12-18 DIAGNOSIS — R002 Palpitations: Secondary | ICD-10-CM | POA: Diagnosis not present

## 2018-12-18 NOTE — Progress Notes (Signed)
Patient Care Team: Bernerd Limbo, MD as PCP - General (Family Medicine) Sueanne Margarita, MD as PCP - Cardiology (Cardiology)   HPI  Robin Clayton is a 63 y.o. female  Seen in followup for atrial tachycardia assoc with palpitations.  Monitors x 2 failed to demonstrate the arrhythmia  She has severe and untreated sleep apnea. Severe HTN and chronic kidney disease  At her last vistit we discussed changing her meds to include a rate controlling CCB  From amlodipine, but her echo just got read ( apparently ) last month.    Her BP is much improved which she ascribes to the fact that she is gotten rid of the sodium in her diet.  She is struggling in the COVID-19 era with isolation and loneliness.  Date Cr K Hgb  12/19 1.2 4.6 13.4           DATE TEST EF   9/12 Echo   60 % Normal Wall Thickness  9/19 Echo  65% Normal Wall Thickness         Records and Results Reviewed   Past Medical History:  Diagnosis Date  . Atrial tachycardia ([place])   . Bradycardia   . Chronic bronchitis ([place])   . Chronic lower back pain   . CKD (chronic kidney disease), stage III ([place])   . Complete heart block ([facility])    a. event monitor in 10/2017 demonstrating nonsustained atrial tachycardia up to 25 beats, sinus bradycardia to sinus tachycardia (30->130bpm), transient complete heart block with ventricular standstill for up to 4 seconds.  . Constipation   . COPD (chronic obstructive pulmonary disease) ([place])   . GERD (gastroesophageal reflux disease)    takes tums occ  . Hypertension   . Morbid obesity ([place])   . Obstructive sleep apnea    moderate OSA with AHI 24/hr intolerant to CPAP and could not afford oral device  . Type II diabetes mellitus ([place])     Past Surgical History:  Procedure Laterality Date  . BREAST BIOPSY  12/06/2011   Procedure: BREAST BIOPSY WITH NEEDLE LOCALIZATION;  Surgeon: Rolm Bookbinder, MD;  Location: [facility];  Service: General;  Laterality: Left;  . CESAREAN  SECTION  1970    Current Meds  Medication Sig  . albuterol (PROAIR HFA) 108 (90 BASE) MCG/ACT inhaler Inhale 2 puffs into the lungs every 6 (six) hours as needed for wheezing.  Marland Kitchen albuterol (PROVENTIL) (2.5 MG/3ML) 0.083% nebulizer solution Take 2.5 mg by nebulization every 4 (four) hours as needed (shortness of breath).   Marland Kitchen amLODipine (NORVASC) 10 MG tablet Take 10 mg by mouth daily.  Marland Kitchen aspirin 81 MG tablet Take 81 mg by mouth daily.  Marland Kitchen atorvastatin (LIPITOR) 10 MG tablet Take 10 mg by mouth every evening.   . budesonide-formoterol (SYMBICORT) 160-4.5 MCG/ACT inhaler Take 2 puffs first thing in am and then another 2 puffs about 12 hours later.  . cloNIDine (CATAPRES) 0.2 MG tablet Take 0.2 mg by mouth daily.   . famotidine (PEPCID) 20 MG tablet Take 1 tablet (20 mg total) by mouth at bedtime.  . hydrochlorothiazide (MICROZIDE) 12.5 MG capsule Take 1 capsule by mouth daily.  Marland Kitchen losartan (COZAAR) 50 MG tablet Take 1 tablet (50 mg total) by mouth daily.  . magnesium oxide (MAG-OX) 400 MG tablet Take 1 tablet (400 mg total) by mouth daily.  . metFORMIN (GLUCOPHAGE) 500 MG tablet Take 500 mg by mouth 2 (two) times daily.  . naproxen sodium (ALEVE) 220  MG tablet Take 220 mg by mouth as needed (PAIN).     Allergies  Allergen Reactions  . Wound Dressings Swelling      Review of Systems negative except from HPI and PMH  Physical Exam BP 126/70   Pulse 93   Ht 5\' 4"  (1.626 m)   Wt 203 lb (92.1 kg)   SpO2 99%   BMI 34.84 kg/m   BP 126/70   Pulse 93   Ht 5\' 4"  (1.626 m)   Wt 203 lb (92.1 kg)   SpO2 99%   BMI 34.84 kg/m  Well developed and Morbidly obese in no acute distress HENT normal Neck supple with JVP-flat Clear Regular rate and rhythm, no  gallop No  murmur Abd-soft with active BS No Clubbing cyanosis   edema Skin-warm and dry A & Oriented  Grossly normal sensory and motor function  ECG sinus at 93 Interval 14/07/37 Axis left -45 Poor R wave progression Low volts      Assessment and  Plan   Nocturnal bradycardia with hyper vagotonia likely secondary to sleep apnea  Obstructive sleep apnea  Exertional tachypalpitations-intermittent  Brief palpitations likely sporadic ectopy  Hypertension-poorly controlled  Low voltage limb lead  Obesity   Her palpitations are largely quiescient.  This may be related to her improved blood pressure control.  In the event that they recur, I would consider verapamil/diltiazem as an alternative to her amlodipine.  At this juncture, we will have her follow-up with Dr. Radford Pax.  We will see her again as needed.  Have encouraged her to exercise for weight loss as well as to get out of the house given the isolation of COVID-19  We spent more than 50% of our >25 min visit in face to face counseling regarding the above   Current medicines are reviewed at length with the patient today .  The patient does not  have concerns regarding medicines.

## 2018-12-18 NOTE — Patient Instructions (Signed)
Medication Instructions:  Your physician recommends that you continue on your current medications as directed. Please refer to the Current Medication list given to you today.  Labwork: None ordered.  Testing/Procedures: None ordered.  Follow-Up: Your physician recommends that you schedule a follow-up appointment   As needed with Dr. Caryl Comes  3-4 months with Dr. Radford Pax  Any Other Special Instructions Will Be Listed Below (If Applicable).     If you need a refill on your cardiac medications before your next appointment, please call your pharmacy.

## 2019-03-20 ENCOUNTER — Ambulatory Visit: Payer: Medicaid Other | Admitting: Cardiology

## 2019-03-21 ENCOUNTER — Encounter (INDEPENDENT_AMBULATORY_CARE_PROVIDER_SITE_OTHER): Payer: Self-pay

## 2019-03-21 ENCOUNTER — Other Ambulatory Visit: Payer: Self-pay

## 2019-03-21 ENCOUNTER — Ambulatory Visit (INDEPENDENT_AMBULATORY_CARE_PROVIDER_SITE_OTHER): Payer: Medicaid Other | Admitting: Cardiology

## 2019-03-21 ENCOUNTER — Encounter: Payer: Self-pay | Admitting: Cardiology

## 2019-03-21 VITALS — BP 150/76 | HR 80 | Ht 63.5 in | Wt 211.6 lb

## 2019-03-21 DIAGNOSIS — I10 Essential (primary) hypertension: Secondary | ICD-10-CM

## 2019-03-21 DIAGNOSIS — R0609 Other forms of dyspnea: Secondary | ICD-10-CM

## 2019-03-21 DIAGNOSIS — G4733 Obstructive sleep apnea (adult) (pediatric): Secondary | ICD-10-CM | POA: Diagnosis not present

## 2019-03-21 DIAGNOSIS — I471 Supraventricular tachycardia: Secondary | ICD-10-CM

## 2019-03-21 DIAGNOSIS — R0600 Dyspnea, unspecified: Secondary | ICD-10-CM

## 2019-03-21 MED ORDER — LOSARTAN POTASSIUM 100 MG PO TABS: 100.0000 mg | ORAL_TABLET | Freq: Every day | ORAL | 3 refills | Status: DC

## 2019-03-21 NOTE — Progress Notes (Signed)
Cardiology Office Note:    Date:  03/21/2019   ID:  Robin Clayton, DOB 1955/12/16, MRN [id number]  PCP:  Bernerd Limbo, MD  Cardiologist:  Fransico Him, MD    Referring MD: Bernerd Limbo, MD   Chief Complaint  Patient presents with   Sleep Apnea   Hypertension    History of Present Illness:    Robin Clayton is a 63 y.o. female with a hx of hypertension, bradycardia, morbid obesity, CKD III by COPD (followed by pulm), OSA intolerant to CPAP (on nocturnal O2), atrial tachycardia, transient complete heart block who presents for 6 month follow-up. She has history of palpitations with event monitor in 10/2017 demonstrating nonsustained atrial tachycardia up to 25 beats, sinus bradycardia to sinus tachycardia (30->130bpm), transient complete heart block with ventricular standstill for up to 4 seconds.    She is here today for followup and is doing well.  She denies any chest pain or pressure, PND, orthopnea, dizziness, palpitations or syncope. She has chronic DOE related to her COPD which she thinks has gotten worse over the past few weeks along with increased wheezing and she has been using her inhaler more.  She denies any fever or chills and has a chronic cough which is normal for her.  She is seeing Dr. Melvyn Novas this Friday.  She he is compliant with her meds and is tolerating meds with no SE.   She has been intolerant to PAP therapy in the past as she has felt that the pressure is too much.  She saw ENT and refused consideration of Inspire device.  She could not afford an oral device.  She would like to try going back on CPAP therapy.   Past Medical History:  Diagnosis Date   Atrial tachycardia (HCC)    Bradycardia    Chronic bronchitis (HCC)    Chronic lower back pain    CKD (chronic kidney disease), stage III    Complete heart block ([place])    a. event monitor in 10/2017 demonstrating nonsustained atrial tachycardia up to 25 beats, sinus bradycardia to sinus tachycardia (30->130bpm),  transient complete heart block with ventricular standstill for up to 4 seconds.   Constipation    COPD (chronic obstructive pulmonary disease) (HCC)    GERD (gastroesophageal reflux disease)    takes tums occ   Hypertension    Morbid obesity ([place])    Obstructive sleep apnea    moderate OSA with AHI 24/hr intolerant to CPAP and could not afford oral device   Type II diabetes mellitus ([place])     Past Surgical History:  Procedure Laterality Date   BREAST BIOPSY  12/06/2011   Procedure: BREAST BIOPSY WITH NEEDLE LOCALIZATION;  Surgeon: Rolm Bookbinder, MD;  Location: [facility];  Service: General;  Laterality: Left;   CESAREAN SECTION  1970    Current Medications: Current Meds  Medication Sig   acetaminophen (TYLENOL) 650 MG CR tablet Take 650 mg by mouth as needed for pain.   albuterol (PROAIR HFA) 108 (90 BASE) MCG/ACT inhaler Inhale 2 puffs into the lungs every 6 (six) hours as needed for wheezing.   albuterol (PROVENTIL) (2.5 MG/3ML) 0.083% nebulizer solution Take 2.5 mg by nebulization every 4 (four) hours as needed (shortness of breath).    amLODipine (NORVASC) 10 MG tablet Take 10 mg by mouth daily.   aspirin 81 MG tablet Take 81 mg by mouth daily.   atorvastatin (LIPITOR) 10 MG tablet Take 10 mg by mouth every evening.    budesonide-formoterol (SYMBICORT)  160-4.5 MCG/ACT inhaler Take 2 puffs first thing in am and then another 2 puffs about 12 hours later.   cloNIDine (CATAPRES) 0.2 MG tablet Take 0.2 mg by mouth 3 (three) times daily.    hydrochlorothiazide (MICROZIDE) 12.5 MG capsule Take 1 capsule by mouth daily.   losartan (COZAAR) 100 MG tablet Take 1 tablet (100 mg total) by mouth daily.   metFORMIN (GLUCOPHAGE) 500 MG tablet Take 500 mg by mouth 2 (two) times daily.   montelukast (SINGULAIR) 10 MG tablet Take 10 mg by mouth daily.   pantoprazole (PROTONIX) 40 MG tablet TAKE 1 TABLET BY MOUTH EVERY DAY 30 TO 60 MINUTES BEFORE FIRST MEAL OF THE DAY    [DISCONTINUED] losartan (COZAAR) 50 MG tablet Take 1 tablet (50 mg total) by mouth daily.     Allergies:   Wound dressings   Social History   Socioeconomic History   Marital status: Single    Spouse name: Not on file   Number of children: 7   Years of education: Not on file   Highest education level: Not on file  Occupational History   Occupation: Disabled  Scientist, product/process development strain: Not on file   Food insecurity    Worry: Not on file    Inability: Not on file   Transportation needs    Medical: Not on file    Non-medical: Not on file  Tobacco Use   Smoking status: Former Smoker    Packs/day: 0.50    Years: 38.00    Pack years: 19.00    Types: Cigarettes    Quit date: 05/03/2014    Years since quitting: 4.8   Smokeless tobacco: Never Used  Substance and Sexual Activity   Alcohol use: No   Drug use: No   Sexual activity: Never  Lifestyle   Physical activity    Days per week: Not on file    Minutes per session: Not on file   Stress: Not on file  Relationships   Social connections    Talks on phone: Not on file    Gets together: Not on file    Attends religious service: Not on file    Active member of club or organization: Not on file    Attends meetings of clubs or organizations: Not on file    Relationship status: Not on file  Other Topics Concern   Not on file  Social History Narrative   Not on file     Family History: The patient's family history includes Asthma in her sister; Cancer in her mother; Diabetes in her father and mother; Heart attack in her father; Hyperlipidemia in her mother; Hypertension in her father and mother; Stomach cancer (age of onset: 88) in her mother. There is no history of Colon cancer.  ROS:   Please see the history of present illness.    ROS  All other systems reviewed and negative.   EKGs/Labs/Other Studies Reviewed:    The following studies were reviewed today: PAP compliance  download  EKG:  EKG is  ordered today.  The ekg ordered today demonstrates   Recent Labs: 04/20/2018: BUN 13; Creatinine, Ser 1.20; Hemoglobin 13.4; Platelets 238; Potassium 4.3; Sodium 139; TSH 1.520 05/02/2018: Magnesium 2.2   Recent Lipid Panel    Component Value Date/Time   CHOL  01/05/2010 0355    184        ATP III CLASSIFICATION:  <200     mg/dL   Desirable  200-239  mg/dL   Borderline High  >=240    mg/dL   High          TRIG 62 01/05/2010 0355   HDL 57 01/05/2010 0355   CHOLHDL 3.2 01/05/2010 0355   VLDL 12 01/05/2010 0355   LDLCALC (H) 01/05/2010 0355    115        Total Cholesterol/HDL:CHD Risk Coronary Heart Disease Risk Table                     Men   Women  1/2 Average Risk   3.4   3.3  Average Risk       5.0   4.4  2 X Average Risk   9.6   7.1  3 X Average Risk  23.4   11.0        Use the calculated Patient Ratio above and the CHD Risk Table to determine the patient's CHD Risk.        ATP III CLASSIFICATION (LDL):  <100     mg/dL   Optimal  100-129  mg/dL   Near or Above                    Optimal  130-159  mg/dL   Borderline  160-189  mg/dL   High  >190     mg/dL   Very High    Physical Exam:    VS:  BP (!) 150/76    Pulse 80    Ht 5' 3.5" (1.613 m)    Wt 211 lb 9.6 oz (96 kg)    SpO2 99%    BMI 36.90 kg/m     Wt Readings from Last 3 Encounters:  03/21/19 211 lb 9.6 oz (96 kg)  12/18/18 203 lb (92.1 kg)  05/12/18 208 lb 9.6 oz (94.6 kg)     GEN:  Well nourished, well developed in no acute distress HEENT: Normal NECK: No JVD; No carotid bruits LYMPHATICS: No lymphadenopathy CARDIAC: RRR, no murmurs, rubs, gallops RESPIRATORY:  Clear to auscultation without rales, wheezing or rhonchi  ABDOMEN: Soft, non-tender, non-distended MUSCULOSKELETAL:  No edema; No deformity  SKIN: Warm and dry NEUROLOGIC:  Alert and oriented x 3 PSYCHIATRIC:  Normal affect   ASSESSMENT:    1. OSA (obstructive sleep apnea)   2. HYPERTENSION, BENIGN ESSENTIAL    3. Morbid obesity ([place])   4. Atrial tachycardia ([place])   5. DOE (dyspnea on exertion)    PLAN:    In order of problems listed above:  1.  OSA  -Intolerant to CPAP in the past and on nocturnal O2 -could not afford oral device -did not want to consider Hypoglossal nerve stimulator -she is interested in trying her CPAP again on an auto setting.  I will change her device to auto from 4 to 18cm H2o and get a download in 4 weeks.   2.  HTN -BP is borderline controlled -continue Clonidine 0.2mg  daily, Amlodipine 10mg  daily, HCTZ 12.5mg  daily and increase Losartan to 100mg  daily -check BMET in 1 week  3.  Morbid Obesity I have encouraged her to get into a routine exercise program and cut back on carbs and portions.   4.  Nonsustained atrial tachycardia -seems to be well controlled and only occasionally has a skipped heart beat.  5.  DOE -this was worse last week but better over the past 2 days.  She has had wheezing and cough but no fever or chills -she has wheezing on exam -  continue inhalers -she has an appt with Pulmonary this week -check TSH, BMET, CBC and BNP     Medication Adjustments/Labs and Tests Ordered: Current medicines are reviewed at length with the patient today.  Concerns regarding medicines are outlined above.  Orders Placed This Encounter  Procedures   For home use only DME continuous positive airway pressure (CPAP)   CBC w/Diff   Pro b natriuretic peptide   TSH   Basic Metabolic Panel (BMET)   Meds ordered this encounter  Medications   losartan (COZAAR) 100 MG tablet    Sig: Take 1 tablet (100 mg total) by mouth daily.    Dispense:  90 tablet    Refill:  3    Signed, Fransico Him, MD  03/21/2019 2:09 PM    [place]

## 2019-03-21 NOTE — Patient Instructions (Addendum)
Medication Instructions:  1) INCREASE LOSARTAN to 100 mg daily  Labwork: You will have labs drawn TODAY: TSH, CBC, BNP  You have an appointment for lab work on April 03, 2019. You may come to the office any time between 7:30AM and 4:30PM. You do not need to be fasting.  Follow-Up: You have an appointment with Dr. Theodosia Blender assistant, Ermalinda Barrios, on April 18, 2019 at 9:00AM.  Any Other Special Instructions Will Be Listed Below (If Applicable). New CPAP orders have been sent in for you.

## 2019-03-22 LAB — CBC WITH DIFFERENTIAL/PLATELET
Basophils Absolute: 0 10*3/uL (ref 0.0–0.2)
Basos: 1 %
EOS (ABSOLUTE): 0.2 10*3/uL (ref 0.0–0.4)
Eos: 3 %
Hematocrit: 40.8 % (ref 34.0–46.6)
Hemoglobin: 13.1 g/dL (ref 11.1–15.9)
Immature Grans (Abs): 0 10*3/uL (ref 0.0–0.1)
Immature Granulocytes: 0 %
Lymphocytes Absolute: 2.3 10*3/uL (ref 0.7–3.1)
Lymphs: 35 %
MCH: 26.4 pg — ABNORMAL LOW (ref 26.6–33.0)
MCHC: 32.1 g/dL (ref 31.5–35.7)
MCV: 82 fL (ref 79–97)
Monocytes Absolute: 0.6 10*3/uL (ref 0.1–0.9)
Monocytes: 9 %
Neutrophils Absolute: 3.5 10*3/uL (ref 1.4–7.0)
Neutrophils: 52 %
Platelets: 219 10*3/uL (ref 150–450)
RBC: 4.97 x10E6/uL (ref 3.77–5.28)
RDW: 14.3 % (ref 11.7–15.4)
WBC: 6.7 10*3/uL (ref 3.4–10.8)

## 2019-03-22 LAB — TSH: TSH: 1.65 u[IU]/mL (ref 0.450–4.500)

## 2019-03-22 LAB — PRO B NATRIURETIC PEPTIDE: NT-Pro BNP: 27 pg/mL (ref 0–287)

## 2019-03-23 ENCOUNTER — Encounter: Payer: Self-pay | Admitting: Internal Medicine

## 2019-03-23 ENCOUNTER — Ambulatory Visit: Payer: Medicaid Other | Admitting: Internal Medicine

## 2019-03-23 ENCOUNTER — Other Ambulatory Visit: Payer: Self-pay

## 2019-03-23 DIAGNOSIS — R058 Other specified cough: Secondary | ICD-10-CM

## 2019-03-23 DIAGNOSIS — G4734 Idiopathic sleep related nonobstructive alveolar hypoventilation: Secondary | ICD-10-CM

## 2019-03-23 DIAGNOSIS — J449 Chronic obstructive pulmonary disease, unspecified: Secondary | ICD-10-CM | POA: Diagnosis not present

## 2019-03-23 DIAGNOSIS — R05 Cough: Secondary | ICD-10-CM

## 2019-03-23 MED ORDER — AZITHROMYCIN 250 MG PO TABS: ORAL_TABLET | ORAL | 0 refills | Status: DC

## 2019-03-23 MED ORDER — BUDESONIDE-FORMOTEROL FUMARATE 80-4.5 MCG/ACT IN AERO: INHALATION_SPRAY | RESPIRATORY_TRACT | 11 refills | Status: DC

## 2019-03-23 MED ORDER — FAMOTIDINE 20 MG PO TABS: ORAL_TABLET | ORAL | 11 refills | Status: DC

## 2019-03-23 MED ORDER — PREDNISONE 10 MG PO TABS: ORAL_TABLET | ORAL | 0 refills | Status: DC

## 2019-03-23 NOTE — Patient Instructions (Addendum)
Symbicort 80 Take 2 puffs first thing in am and then another 2 puffs about 12 hours later.    Pantoprazole (protonix) 40 mg   Take  30-60 min before first meal of the day and Pepcid (famotidine)  20 mg one after supper  until return to office - this is the best way to tell whether stomach acid is contributing to your problem.    GERD (REFLUX)  is an extremely common cause of respiratory symptoms just like yours , many times with no obvious heartburn at all.    It can be treated with medication, but also with lifestyle changes including elevation of the head of your bed (ideally with 6 inch  bed blocks),  Smoking cessation, avoidance of late meals, excessive alcohol, and avoid fatty foods, chocolate, peppermint, colas, red wine, and acidic juices such as orange juice.  NO MINT OR MENTHOL PRODUCTS SO NO COUGH DROPS   USE SUGARLESS CANDY INSTEAD (Jolley ranchers or Stover's or Life Savers) or even ice chips will also do - the key is to swallow to prevent all throat clearing. NO OIL BASED VITAMINS - use powdered substitutes.  Prednisone 10 mg take  4 each am x 2 days,   2 each am x 2 days,  1 each am x 2 days and stop   zpak     For cough > mucinex dm up to 1200 mg every 12 hours as needed    Please remember to go to the  x-ray department  for your tests - we will call you with the results when they are available   Please schedule a follow up office visit in 4 weeks, sooner if needed  with all medications /inhalers/ solutions in hand so we can verify exactly what you are taking. This includes all medications from all doctors and over the counters - needs cxr on return

## 2019-03-23 NOTE — Progress Notes (Signed)
Subjective:    Patient ID: Robin Clayton, female    DOB: 01-25-1956  MRN: [id number]   Brief patient profile:  33 yobf  Quit smoking 05/2014    no problems until around 2000 referred to pulmonary clinic 05/08/2012 by Dr Billey Chang with baseline wt 150 but freq steroids required for flares of ab  to wt 253 at first pulmonary clinic ov and documented GOLD II copd 12/05/12    History of Present Illness  05/08/2012 1st ov/ Robin Clayton/ cc 10 years of intermittent cough and sob responsive to short courses of prednisone progressively worse indolent onset doe even in absence of cough to point where sob across the parking lot then developed cp comes goes x 2 months lasting up to a half a day unless takes vicodin. Location is ant, generalized, assoc with overt HB, non-radiating, no worse walking. Coughing does not make it worse. rec Continue symbicort 160 Take 2 puffs first thing in am and then another 2 puffs about 12 hours later.  Pantoprazole 40 mg Take 30-60 min before first meal of the day  GERD  diet Please schedule a follow up office visit in 4 weeks, sooner if needed with pft's on return > did not return  09/15/2012 f/u ov/Robin Clayton copd/ 02 dep at baseline/ now on ACEi but quit smoking effective 08/31/12 Chief Complaint  Patient presents with  . Follow-up    Increased DOE x 2 wks, and wheezing for the past 3 days.   sob does improve with neb for an hour or two or rest  But aslo  wakes at night with hoarsness and dry cough day > niight Indolent onset, progressively worse. rec Pepcid 20 ac one at bedtime Stop lisinopril and start micardis 40 mg one daily  Plan A = automatic = symbicort 2 puff followed by one of tudorza twice daily until return Plan B = Backup = proaire up to every 4 hours only  Plan C = Nebulizer every 4 hours Prednisone 10 mg take  4 each am x 2 days,   2 each am x 2 days,  1 each am x2days and stop        03/05/2013 f/u ov/Robin Clayton re: COPD GOLD II Chief Complaint  Patient presents with   . Follow-up    Pt states that her breathing is doing well. She uses albuterol inhaler approx 3 times per wk on average- mainly in the am's.     She is confusing symbicort and saba and turns out she's just using just the symbicort occ in ams maybe 3 x weekly s limiting doe rec Ok to just use the symbicort 160 up to 2 puffs every 12 hours as needed for any respiratory problems but if you start needing the rescue inhaler then really need to be more consistent with use of symbicort (not as needed)    07/10/2013 f/u ov/Robin Clayton re: GOLD II COPD using symbicort 4 x a month / has had 02 x years for prn hs use Chief Complaint  Patient presents with  . Follow-up    Pt here to recertify for 02 qhs.  SOB with exertion, no other complaints.   really Not limited by breathing from desired activities   rec Ok to just use the symbicort 160 up to 2 puffs every 12 hours as needed for any respiratory problems but if you start needing the rescue inhaler then really need to be more consistent with use of symbicort (not as needed) 2 puffs perfectly regularly every 12  hours  Please see patient coordinator before you leave today  to schedule overnight 02 sats room air > did not do      12/03/2016  f/u ov/Robin Clayton re:  Copd II/ needs clearance for R hip replacement  Chief Complaint  Patient presents with  . Pulmonary Consult    Pt states needing pulmonary clearance for total hip replacement- rt. She states her surgeon (can not recall his name at the time) noticed she had a cough and wanted her to be cleared. Pt states she has occ cough with clear sputum.  She has not needed albuterol inhaler or neb.   maint on symb 80 2bid and no need for saba - Not limited by breathing from desired activities  But by hips cough is random x one month, doesn't wake her up On protonix 40 mg randomly if takes at all  rec Work on inhaler technique:  Pantoprazole (protonix) 40 mg   Take  30-60 min before first meal of the day and Pepcid  (famotidine)  20 mg one @  bedtime until return to office - this is the best way to tell whether stomach acid is contributing to your problem.   GERD (REFLUX)  You are cleared for surgery  >  Had bilateral THR > much better mobility    03/23/2019  Ext ov/Robin Clayton re: re establish GOLD II copd with  worse sob/ cough x 3 m on symb  / protonix  Chief Complaint  Patient presents with  . Acute Visit    Patient reports that she's sob with exertion. She reports that she has alot of congestion in her chest and her sputum is brown in color.   Dyspnea:  Push cart at [employer] and [employer] parking/ much worse x last week or two   Cough: worse also x sev weeks = brown esp in am  no abx or prednisone recently  Sleeping: can't do cpap x one year, on 2lpm hs and sleeps on L side 3 pillows and feels rested  SABA use: 4 x daily  02: 2lpm hs only    No obvious day to day or daytime variability or assoc   mucus plugs or hemoptysis or cp or chest tightness, subjective wheeze or overt sinus or hb symptoms.   Sleeping as above  without nocturnal  or early am exacerbation  of respiratory  c/o's or need for noct saba. Also denies any obvious fluctuation of symptoms with weather or environmental changes or other aggravating or alleviating factors except as outlined above   No unusual exposure hx or h/o childhood pna/ asthma or knowledge of premature birth.  Current Allergies, Complete Past Medical History, Past Surgical History, Family History, and Social History were reviewed in [employer] record.  ROS  The following are not active complaints unless bolded Hoarseness, sore throat, dysphagia, dental problems, itching, sneezing,  nasal congestion or discharge of excess mucus or purulent secretions, ear ache,   fever, chills, sweats, unintended wt loss or wt gain, classically pleuritic or exertional cp,  orthopnea pnd or arm/hand swelling  or leg swelling, presyncope, palpitations, abdominal pain,  anorexia, nausea, vomiting, diarrhea  or change in bowel habits or change in bladder habits, change in stools or change in urine, dysuria, hematuria,  rash, arthralgias, visual complaints, headache, numbness, weakness or ataxia or problems with walking or coordination,  change in mood or  memory.        Current Meds  Medication Sig  . acetaminophen (TYLENOL) 650  MG CR tablet Take 650 mg by mouth as needed for pain.  Marland Kitchen albuterol (PROAIR HFA) 108 (90 BASE) MCG/ACT inhaler Inhale 2 puffs into the lungs every 6 (six) hours as needed for wheezing.  Marland Kitchen albuterol (PROVENTIL) (2.5 MG/3ML) 0.083% nebulizer solution Take 2.5 mg by nebulization every 4 (four) hours as needed (shortness of breath).   Marland Kitchen amLODipine (NORVASC) 10 MG tablet Take 10 mg by mouth daily.  Marland Kitchen aspirin 81 MG tablet Take 81 mg by mouth daily.  Marland Kitchen atorvastatin (LIPITOR) 10 MG tablet Take 10 mg by mouth every evening.   . budesonide-formoterol (SYMBICORT) 160-4.5 MCG/ACT inhaler Take 2 puffs first thing in am and then another 2 puffs about 12 hours later.  . cloNIDine (CATAPRES) 0.2 MG tablet Take 0.2 mg by mouth 3 (three) times daily.   . hydrochlorothiazide (MICROZIDE) 12.5 MG capsule Take 1 capsule by mouth daily.  Marland Kitchen losartan (COZAAR) 100 MG tablet Take 1 tablet (100 mg total) by mouth daily.  . metFORMIN (GLUCOPHAGE) 500 MG tablet Take 500 mg by mouth 2 (two) times daily.  . montelukast (SINGULAIR) 10 MG tablet Take 10 mg by mouth daily.  . pantoprazole (PROTONIX) 40 MG tablet TAKE 1 TABLET BY MOUTH EVERY DAY 30 TO 60 MINUTES BEFORE FIRST MEAL OF THE DAY                   Objective:   Physical Exam  amb obese bf hoarse with prominent pseudowheeze   03/23/2019    210  12/05/2012          223 > 217 03/05/2013 > 207 07/10/2013 > 01/03/2015   204 > 12/03/2016  190              09/15/12 245 lb (111.131 kg)  05/08/12 253 lb 3.2 oz (114.851 kg)  12/17/11 241 lb 4 oz (109.43 kg)     BP 124/80 (BP Location: Left Arm, Cuff Size: Normal)    Pulse 82   Temp 97.6 F (36.4 C) (Temporal)   Ht 5' 3.5" (1.613 m)   Wt 210 lb 6.4 oz (95.4 kg)   SpO2 100% Comment: on room air  BMI 36.69 kg/m     .HEENT : pt wearing mask not removed for exam due to covid - 19 concerns.   NECK :  without JVD/Nodes/TM/ nl carotid upstrokes bilaterally   LUNGS: no acc muscle use,  Min barrel  contour chest wall with bilateral  slightly decreased bs with mostly transmitted upper airway sounds  and  without cough on insp or exp maneuvers and min  Hyperresonant  to  percussion bilaterally     CV:  RRR  no s3 or murmur or increase in P2, and trace pitting both LEs  ABD: quite obese soft and nontender with pos end  insp Hoover's  in the supine position. No bruits or organomegaly appreciated, bowel sounds nl  MS:   Nl gait/  ext warm without deformities, calf tenderness, cyanosis or clubbing No obvious joint restrictions   SKIN: warm and dry without lesions    NEURO:  alert, approp, nl sensorium with  no motor or cerebellar deficits apparent.        rec cxr 03/23/2019 did not go        Assessment & Plan:

## 2019-03-24 ENCOUNTER — Encounter: Payer: Self-pay | Admitting: Internal Medicine

## 2019-03-24 NOTE — Assessment & Plan Note (Addendum)
Quit smoking around 05/2014  - ACEi d/c    09/16/2012 due to cough - 12/05/2012 PFTs FEV1 1.22 (58%)  64 and no better p B2,  DLCO 59 and 74% - 03/23/2019  After extensive coaching inhaler device,  effectiveness =    90% > try symb 80 2bid due to prominent hoarseness/ pseudowheezes  Having moderate flare of symptoms with overuse of saba  but this appears to  Be mostly  upper airway (UACS, see sep ap)  clinically so rec   zpak Prednisone 10 mg take  4 each am x 2 days,   2 each am x 2 days,  1 each am x 2 days and stop  Mucinex dm up to 1200 mg bid  Change symb to 80 2bid which will irritate the upper airway less and consider change to stiolto on return if not better on return

## 2019-03-24 NOTE — Assessment & Plan Note (Addendum)
Body mass index is 36.69 kg/m.  -  trending up Lab Results  Component Value Date   TSH 1.650 03/21/2019     Contributing to gerd risk/ doe/reviewed the need and the process to achieve and maintain neg calorie balance > defer f/u primary care including intermittently monitoring thyroid status      I had an extended discussion with the patient reviewing all relevant studies completed to date and  lasting 25 minutes of a 40 minute acute office visit with pt not seen here x > 2 years    I performed detailed device teaching using a teach back method which extended face to face time for this visit (see above)  Each maintenance medication was reviewed in detail including emphasizing most importantly the difference between maintenance and prns and under what circumstances the prns are to be triggered using an action plan format that is not reflected in the computer generated alphabetically organized AVS which I have not found useful in most complex patients, especially with respiratory illnesses  Please see AVS for specific instructions unique to this visit that I personally wrote and verbalized to the the pt in detail and then reviewed with pt  by my nurse highlighting any  changes in therapy recommended at today's visit to their plan of care.

## 2019-03-24 NOTE — Assessment & Plan Note (Signed)
-   on CPAP and 02 prn chronically but doesn't use either consistently as of 07/10/2013  - ono RA 07/10/2013 (would prefer to keep 02 if qualifies off cpap) :   desat < 89% x 30 m on RA 07/25/13 so rec continue noct 02   Feels rested on 02 2lpm s daytime hypersomnolence or am ha and can't tol cpap  so ok to leaveit off cpap and just rx with 02 2lpm

## 2019-03-24 NOTE — Assessment & Plan Note (Addendum)
ACEi d/c    09/16/2012 due to cough - Added  back daily ppi as of 12/03/2016  - flared 12/2018 >>  reduced symb to 80 2bid 03/23/2019 and max rx for gerd  Upper airway cough syndrome (previously labeled PNDS),  is so named because it's frequently impossible to sort out how much is  CR/sinusitis with freq throat clearing (which can be related to primary GERD)   vs  causing  secondary (" extra esophageal")  GERD from wide swings in gastric pressure that occur with throat clearing, often  promoting self use of mint and menthol lozenges that reduce the lower esophageal sphincter tone and exacerbate the problem further in a cyclical fashion.   These are the same pts (now being labeled as having "irritable larynx syndrome" by some cough centers) who not infrequently have a history of having failed to tolerate ace inhibitors,  dry powder inhalers or biphosphonates or report having atypical/extraesophageal reflux symptoms that don't respond to standard doses of PPI  and are easily confused as having aecopd or asthma flares by even experienced allergists/ pulmonologists (myself included).   >>> return in 4 weeks with all meds in hand using a trust but verify approach to confirm accurate Medication  Reconciliation The principal here is that until we are certain that the  patients are doing what we've asked, it makes no sense to ask them to do more.

## 2019-03-26 ENCOUNTER — Telehealth: Payer: Self-pay | Admitting: Cardiology

## 2019-03-26 NOTE — Telephone Encounter (Signed)
Keep on Losartan 50mg  daily and check BP at lunch daily for a week and call with results

## 2019-03-26 NOTE — Telephone Encounter (Signed)
Spoke with pt, states she took Losartan 100mg  yesterday for the first time.  Pt reports mid day she developed a HA and had some heart flutter.  B/P at that time was 121/47 with pulse rate of 102.  Pt had a total of 3 -  16.9oz bottles of water yesterday.  She also states she was  started on Prednisone 10mg  and Azithromycin 250 mg 03/24/2019 by Dr Melvyn Novas.  Pt has taken the Losartan 50mg  this am and states she feels good and very hesitant to take Losartan 100mg  again.  Please advise.

## 2019-03-26 NOTE — Telephone Encounter (Signed)
New message  Pt c/o medication issue:  1. Name of Medication: losartan (COZAAR) 100 MG tablet  2. How are you currently taking this medication (dosage and times per day)? 100 mg once a day  3. Are you having a reaction (difficulty breathing--STAT)? Yes  4. What is your medication issue? Patient states since the increase from 50 mg daily to 100 mg daily, patient had headache and increase is heart rate (102), BP was 121/47, but she states that she is not having a good reaction to the increase. Please call back to discuss.

## 2019-03-27 NOTE — Telephone Encounter (Signed)
Pt advised to continue  Losartan 50mg  daily and check B/P at lunch daily for a week and call with results.  Pt states she has an appointment 04/02/2019.  Pt advised to bring B/P readings with her to visit.  Pt verbalizes understanding  And agrees with plan.

## 2019-03-28 ENCOUNTER — Ambulatory Visit (INDEPENDENT_AMBULATORY_CARE_PROVIDER_SITE_OTHER): Payer: Medicaid Other

## 2019-03-28 DIAGNOSIS — J449 Chronic obstructive pulmonary disease, unspecified: Secondary | ICD-10-CM | POA: Diagnosis not present

## 2019-03-28 NOTE — Progress Notes (Signed)
LMTCB

## 2019-04-03 ENCOUNTER — Other Ambulatory Visit: Payer: Medicaid Other

## 2019-04-05 ENCOUNTER — Other Ambulatory Visit: Payer: Self-pay

## 2019-04-05 ENCOUNTER — Telehealth: Payer: Self-pay | Admitting: *Deleted

## 2019-04-05 ENCOUNTER — Telehealth: Payer: Self-pay

## 2019-04-05 ENCOUNTER — Other Ambulatory Visit: Payer: Medicaid Other | Admitting: *Deleted

## 2019-04-05 DIAGNOSIS — I471 Supraventricular tachycardia: Secondary | ICD-10-CM

## 2019-04-05 MED ORDER — LOSARTAN POTASSIUM 100 MG PO TABS: 50.0000 mg | ORAL_TABLET | Freq: Every day | ORAL | 3 refills | Status: DC

## 2019-04-05 NOTE — Telephone Encounter (Signed)
I spoke to the patient who confirmed that she is on Losartan 50 mg Daily.  She will monitor BP and keep [place] updated.

## 2019-04-05 NOTE — Telephone Encounter (Signed)
   [facility] Pre-operative Risk Assessment    Request for surgical clearance:  1. What type of surgery is being performed? DENTAL EXTRACTIONS   2. When is this surgery scheduled? TBD   3. What type of clearance is required (medical clearance vs. Pharmacy clearance to hold med vs. Both)? MEDICAL  4. Are there any medications that need to be held prior to surgery and how long? ASA   5. Practice name and name of physician performing surgery? [facility]; DR. Hortencia Pilar, DMD   6. What is your office phone number [phone]    7.   What is your office fax number [phone]  8.   Anesthesia type (None, local, MAC, general) ? IV SEDATION   Julaine Hua 04/05/2019, 5:19 PM  _________________________________________________________________   (provider comments below)

## 2019-04-06 ENCOUNTER — Telehealth: Payer: Self-pay

## 2019-04-06 LAB — BASIC METABOLIC PANEL
BUN/Creatinine Ratio: 13 (ref 12–28)
BUN: 14 mg/dL (ref 8–27)
CO2: 24 mmol/L (ref 20–29)
Calcium: 9.3 mg/dL (ref 8.7–10.3)
Chloride: 100 mmol/L (ref 96–106)
Creatinine, Ser: 1.04 mg/dL — ABNORMAL HIGH (ref 0.57–1.00)
GFR calc Af Amer: 67 mL/min/{1.73_m2} (ref >59)
GFR calc non Af Amer: 58 mL/min/{1.73_m2} — ABNORMAL LOW (ref >59)
Glucose: 145 mg/dL — ABNORMAL HIGH (ref 65–99)
Potassium: 4.3 mmol/L (ref 3.5–5.2)
Sodium: 139 mmol/L (ref 134–144)

## 2019-04-06 NOTE — Telephone Encounter (Signed)
   Primary Cardiologist: Fransico Him, MD  Chart reviewed as part of pre-operative protocol coverage. Simple dental extractions are considered low risk procedures per guidelines and generally do not require any specific cardiac clearance. It is also generally accepted that for simple extractions and dental cleanings, there is no need to interrupt blood thinner therapy.   SBE prophylaxis is not required for the patient.  I will route this recommendation to the requesting party via Epic fax function and remove from pre-op pool.  Please call with questions.  Kathyrn Drown, NP 04/06/2019, 8:20 AM

## 2019-04-06 NOTE — Telephone Encounter (Signed)
Pre-op team   Can we find out how many teeth will be extracted?  Thank you  Sharee Pimple

## 2019-04-06 NOTE — Telephone Encounter (Signed)
Spoke with [facility], she confirms that the pt is set to have  2 Extractions

## 2019-04-06 NOTE — Telephone Encounter (Signed)
The patient has been notified of the result and verbalized understanding.  All questions (if any) were answered. Wilma Flavin, RN 04/06/2019 8:22 AM

## 2019-04-06 NOTE — Telephone Encounter (Signed)
-----   Message from Sueanne Margarita, MD sent at 04/06/2019  7:37 AM EST ----- Please let patient know that labs were normal.  Continue current medical therapy.

## 2019-04-06 NOTE — Progress Notes (Signed)
Spoke with pt and notified of results per Dr. Wert. Pt verbalized understanding and denied any questions. 

## 2019-04-13 ENCOUNTER — Other Ambulatory Visit: Payer: Self-pay

## 2019-04-13 ENCOUNTER — Emergency Department (HOSPITAL_COMMUNITY)
Admission: EM | Admit: 2019-04-13 | Discharge: 2019-04-13 | Disposition: A | Discharge status: Home / Self Care | Payer: Medicaid Other | Attending: Emergency Medicine | Admitting: Emergency Medicine

## 2019-04-13 DIAGNOSIS — R002 Palpitations: Secondary | ICD-10-CM | POA: Insufficient documentation

## 2019-04-13 DIAGNOSIS — Z87891 Personal history of nicotine dependence: Secondary | ICD-10-CM | POA: Diagnosis not present

## 2019-04-13 DIAGNOSIS — R739 Hyperglycemia, unspecified: Secondary | ICD-10-CM

## 2019-04-13 DIAGNOSIS — I129 Hypertensive chronic kidney disease with stage 1 through stage 4 chronic kidney disease, or unspecified chronic kidney disease: Secondary | ICD-10-CM | POA: Diagnosis not present

## 2019-04-13 DIAGNOSIS — Z79899 Other long term (current) drug therapy: Secondary | ICD-10-CM | POA: Diagnosis not present

## 2019-04-13 DIAGNOSIS — Z7984 Long term (current) use of oral hypoglycemic drugs: Secondary | ICD-10-CM | POA: Diagnosis not present

## 2019-04-13 DIAGNOSIS — N183 Chronic kidney disease, stage 3 unspecified: Secondary | ICD-10-CM | POA: Insufficient documentation

## 2019-04-13 DIAGNOSIS — E1165 Type 2 diabetes mellitus with hyperglycemia: Secondary | ICD-10-CM | POA: Diagnosis not present

## 2019-04-13 DIAGNOSIS — E1122 Type 2 diabetes mellitus with diabetic chronic kidney disease: Secondary | ICD-10-CM | POA: Insufficient documentation

## 2019-04-13 DIAGNOSIS — J449 Chronic obstructive pulmonary disease, unspecified: Secondary | ICD-10-CM | POA: Insufficient documentation

## 2019-04-13 DIAGNOSIS — Z7982 Long term (current) use of aspirin: Secondary | ICD-10-CM | POA: Diagnosis not present

## 2019-04-13 LAB — CBC WITH DIFFERENTIAL/PLATELET
Abs Immature Granulocytes: 0.03 10*3/uL (ref 0.00–0.07)
Basophils Absolute: 0 10*3/uL (ref 0.0–0.1)
Basophils Relative: 1 %
Eosinophils Absolute: 0.1 10*3/uL (ref 0.0–0.5)
Eosinophils Relative: 1 %
HCT: 40.2 % (ref 36.0–46.0)
Hemoglobin: 12.5 g/dL (ref 12.0–15.0)
Immature Granulocytes: 0 %
Lymphocytes Relative: 19 %
Lymphs Abs: 1.3 10*3/uL (ref 0.7–4.0)
MCH: 26.9 pg (ref 26.0–34.0)
MCHC: 31.1 g/dL (ref 30.0–36.0)
MCV: 86.5 fL (ref 80.0–100.0)
Monocytes Absolute: 0.3 10*3/uL (ref 0.1–1.0)
Monocytes Relative: 5 %
Neutro Abs: 5.2 10*3/uL (ref 1.7–7.7)
Neutrophils Relative %: 74 %
Platelets: 193 10*3/uL (ref 150–400)
RBC: 4.65 MIL/uL (ref 3.87–5.11)
RDW: 15.9 % — ABNORMAL HIGH (ref 11.5–15.5)
WBC: 7.1 10*3/uL (ref 4.0–10.5)
nRBC: 0 % (ref 0.0–0.2)

## 2019-04-13 LAB — BASIC METABOLIC PANEL
Anion gap: 12 (ref 5–15)
BUN: 18 mg/dL (ref 8–23)
CO2: 24 mmol/L (ref 22–32)
Calcium: 8.8 mg/dL — ABNORMAL LOW (ref 8.9–10.3)
Chloride: 101 mmol/L (ref 98–111)
Creatinine, Ser: 1.28 mg/dL — ABNORMAL HIGH (ref 0.44–1.00)
GFR calc Af Amer: 52 mL/min — ABNORMAL LOW (ref >60)
GFR calc non Af Amer: 45 mL/min — ABNORMAL LOW (ref >60)
Glucose, Bld: 253 mg/dL — ABNORMAL HIGH (ref 70–99)
Potassium: 4 mmol/L (ref 3.5–5.1)
Sodium: 137 mmol/L (ref 135–145)

## 2019-04-13 LAB — TROPONIN I (HIGH SENSITIVITY)
Troponin I (High Sensitivity): 22 ng/L — ABNORMAL HIGH (ref <18)
Troponin I (High Sensitivity): 24 ng/L — ABNORMAL HIGH (ref <18)

## 2019-04-13 LAB — CBG MONITORING, ED: Glucose-Capillary: 296 mg/dL — ABNORMAL HIGH (ref 70–99)

## 2019-04-13 MED ORDER — INSULIN ASPART 100 UNIT/ML ~~LOC~~ SOLN
10.0000 [IU] | Freq: Once | SUBCUTANEOUS | Status: AC
  Administered 2019-04-13: 10 [IU] via SUBCUTANEOUS
  Filled 2019-04-13: qty 0.1

## 2019-04-13 NOTE — ED Notes (Signed)
Pt ambulatory to restroom without concern

## 2019-04-13 NOTE — ED Notes (Signed)
Patient walking to restroom  

## 2019-04-13 NOTE — ED Notes (Signed)
Pt ambulated back from restroom with no assistance.

## 2019-04-13 NOTE — ED Triage Notes (Signed)
New onset of heart palpitations and SOB Hx COPD 125 solumedrol on EMS

## 2019-04-13 NOTE — ED Provider Notes (Signed)
[facility] DEPT Provider Note: Georgena Spurling, MD, FACEP  CSN: [id number] MRN: [id number] ARRIVAL: 04/13/19 at [place]  Palpitations   HISTORY OF PRESENT ILLNESS  04/13/19 1:48 AM Robin Clayton is a 63 y.o. female with a history of COPD and atrial tachycardia.  She is here after awakening from sleep with a sensation of her heart pounding.  This is associated with some shortness of breath and she called EMS.  Her symptoms lasted about 30 minutes and resolved on their own.  She was given 125 mg of Solu-Medrol IV prior to arrival.  She denies chest pain at any time.  She now feels back to her baseline.  Nothing made her symptoms better or worse.   Past Medical History:  Diagnosis Date  . Atrial tachycardia ([place])   . Bradycardia   . Chronic bronchitis ([place])   . Chronic lower back pain   . CKD (chronic kidney disease), stage III   . Complete heart block ([facility])    a. event monitor in 10/2017 demonstrating nonsustained atrial tachycardia up to 25 beats, sinus bradycardia to sinus tachycardia (30->130bpm), transient complete heart block with ventricular standstill for up to 4 seconds.  . Constipation   . COPD (chronic obstructive pulmonary disease) ([place])   . GERD (gastroesophageal reflux disease)    takes tums occ  . Hypertension   . Morbid obesity ([place])   . Obstructive sleep apnea    moderate OSA with AHI 24/hr intolerant to CPAP and could not afford oral device  . Type II diabetes mellitus ([place])     Past Surgical History:  Procedure Laterality Date  . BREAST BIOPSY  12/06/2011   Procedure: BREAST BIOPSY WITH NEEDLE LOCALIZATION;  Surgeon: Rolm Bookbinder, MD;  Location: [facility];  Service: General;  Laterality: Left;  . CESAREAN SECTION  1970    Family History  Problem Relation Age of Onset  . Stomach cancer Mother 73  . Hypertension Mother   . Hyperlipidemia Mother   . Diabetes Mother   . Cancer Mother        stomach  . Hypertension Father    . Diabetes Father   . Heart attack Father   . Asthma Sister   . Colon cancer Neg Hx     Social History   Tobacco Use  . Smoking status: Former Smoker    Packs/day: 0.50    Years: 38.00    Pack years: 19.00    Types: Cigarettes    Quit date: 05/03/2014    Years since quitting: 4.9  . Smokeless tobacco: Never Used  Substance Use Topics  . Alcohol use: No  . Drug use: No    Prior to Admission medications   Medication Sig Start Date End Date Taking? Authorizing Provider  acetaminophen (TYLENOL) 650 MG CR tablet Take 650 mg by mouth as needed for pain.   Yes [provider]  albuterol (PROAIR HFA) 108 (90 BASE) MCG/ACT inhaler Inhale 2 puffs into the lungs every 6 (six) hours as needed for wheezing.   Yes [provider]  albuterol (PROVENTIL) (2.5 MG/3ML) 0.083% nebulizer solution Take 2.5 mg by nebulization every 4 (four) hours as needed (shortness of breath).  05/14/14  Yes [provider]  amLODipine (NORVASC) 10 MG tablet Take 10 mg by mouth daily.   Yes [provider]  aspirin 81 MG tablet Take 81 mg by mouth daily.   Yes [provider]  atorvastatin (LIPITOR) 10 MG tablet Take  10 mg by mouth every evening.  09/25/12  Yes [provider]  budesonide-formoterol (SYMBICORT) 80-4.5 MCG/ACT inhaler Take 2 puffs first thing in am and then another 2 puffs about 12 hours later. 03/23/19  Yes [name], MD  cloNIDine (CATAPRES) 0.2 MG tablet Take 0.2 mg by mouth 3 (three) times daily.    Yes [provider]  famotidine (PEPCID) 20 MG tablet One after supper Patient taking differently: Take 20 mg by mouth every evening.  03/23/19  Yes [name], MD  hydrochlorothiazide (MICROZIDE) 12.5 MG capsule Take 1 capsule by mouth daily. 03/10/18  Yes [provider]  losartan (COZAAR) 100 MG tablet Take 0.5 tablets (50 mg total) by mouth daily. 04/05/19 07/04/19 Yes Turner, Eber Hong, MD  metFORMIN (GLUCOPHAGE) 500 MG  tablet Take 500 mg by mouth 2 (two) times daily. 06/16/17  Yes [provider]  montelukast (SINGULAIR) 10 MG tablet Take 10 mg by mouth daily. 03/08/19  Yes [provider]  pantoprazole (PROTONIX) 40 MG tablet Take 40 mg by mouth daily. 30-60 minutes before first meal of the day 03/14/19  Yes [provider]  azithromycin (ZITHROMAX) 250 MG tablet Take 2 on day one then 1 daily x 4 days Patient not taking: Reported on 04/13/2019 03/23/19   [name], MD  predniSONE (DELTASONE) 10 MG tablet Take  4 each am x 2 days,   2 each am x 2 days,  1 each am x 2 days and stop Patient not taking: Reported on 04/13/2019 03/23/19   [name], MD    Allergies Wound dressings   REVIEW OF SYSTEMS  Negative except as noted here or in the History of Present Illness.   PHYSICAL EXAMINATION  Initial Vital Signs Blood pressure (!) 144/89, pulse 89, temperature 98.8 F (37.1 C), temperature source Oral, resp. rate (!) 23, height 5\' 3"  (1.6 m), weight 95.7 kg, last menstrual period 04/30/2012, SpO2 97 %.  Examination General: Well-developed, well-nourished female in no acute distress; appearance consistent with age of record HENT: normocephalic; atraumatic Eyes: pupils equal, round and reactive to light; extraocular muscles intact; arcus senilis bilaterally Neck: supple Heart: regular rate and rhythm; no ectopy Lungs: clear to auscultation bilaterally Abdomen: soft; nondistended; nontender; bowel sounds present Extremities: No deformity; full range of motion; pulses normal; trace edema of lower legs Neurologic: Awake, alert and oriented; motor function intact in all extremities and symmetric; no facial droop Skin: Warm and dry Psychiatric: Normal mood and affect   RESULTS  Summary of this visit's results, reviewed and interpreted by myself:  EKG Interpretation:  Date & Time: 04/13/2019 12:20 AM  Rate: 91  Rhythm: normal sinus rhythm  QRS Axis: normal   Intervals: normal  ST/T Wave abnormalities: normal  Conduction Disutrbances: normal  Narrative Interpretation:   Old EKG Reviewed: none available  Laboratory Studies: Results for orders placed or performed during the hospital encounter of 04/13/19 (from the past 24 hour(s))  CBC with Differential     Status: Abnormal   Collection Time: 04/13/19  1:58 AM  Result Value Ref Range   WBC 7.1 4.0 - 10.5 K/uL   RBC 4.65 3.87 - 5.11 MIL/uL   Hemoglobin 12.5 12.0 - 15.0 g/dL   HCT 40.2 36.0 - 46.0 %   MCV 86.5 80.0 - 100.0 fL   MCH 26.9 26.0 - 34.0 pg   MCHC 31.1 30.0 - 36.0 g/dL   RDW 15.9 (H) 11.5 - 15.5 %   Platelets 193  150 - 400 K/uL   nRBC 0.0 0.0 - 0.2 %   Neutrophils Relative % 74 %   Neutro Abs 5.2 1.7 - 7.7 K/uL   Lymphocytes Relative 19 %   Lymphs Abs 1.3 0.7 - 4.0 K/uL   Monocytes Relative 5 %   Monocytes Absolute 0.3 0.1 - 1.0 K/uL   Eosinophils Relative 1 %   Eosinophils Absolute 0.1 0.0 - 0.5 K/uL   Basophils Relative 1 %   Basophils Absolute 0.0 0.0 - 0.1 K/uL   Immature Granulocytes 0 %   Abs Immature Granulocytes 0.03 0.00 - 0.07 K/uL  Basic metabolic panel     Status: Abnormal   Collection Time: 04/13/19  1:58 AM  Result Value Ref Range   Sodium 137 135 - 145 mmol/L   Potassium 4.0 3.5 - 5.1 mmol/L   Chloride 101 98 - 111 mmol/L   CO2 24 22 - 32 mmol/L   Glucose, Bld 253 (H) 70 - 99 mg/dL   BUN 18 8 - 23 mg/dL   Creatinine, Ser 1.28 (H) 0.44 - 1.00 mg/dL   Calcium 8.8 (L) 8.9 - 10.3 mg/dL   GFR calc non Af Amer 45 (L) >60 mL/min   GFR calc Af Amer 52 (L) >60 mL/min   Anion gap 12 5 - 15  Troponin I (High Sensitivity)     Status: Abnormal   Collection Time: 04/13/19  1:58 AM  Result Value Ref Range   Troponin I (High Sensitivity) 22 (H) <18 ng/L  Troponin I (High Sensitivity)     Status: Abnormal   Collection Time: 04/13/19  4:07 AM  Result Value Ref Range   Troponin I (High Sensitivity) 24 (H) <18 ng/L  CBG monitoring, ED     Status: Abnormal    Collection Time: 04/13/19  5:27 AM  Result Value Ref Range   Glucose-Capillary 296 (H) 70 - 99 mg/dL   Imaging Studies: No results found.  ED COURSE and MDM  Nursing notes, initial and subsequent vitals signs, including pulse oximetry, reviewed and interpreted by myself.  Vitals:   04/13/19 0204 04/13/19 0320 04/13/19 0404 04/13/19 0530  BP: (!) 132/57 (!) 168/67 (!) 173/98 (!) 155/72  Pulse: 79 83 90 85  Resp: (!) 21 (!) 24 (!) 22 20  Temp:      TempSrc:      SpO2: 95% 96% 97% 96%  Weight:      Height:       Medications  insulin aspart (novoLOG) injection 10 Units (has no administration in time range)    5:02 AM Patient has been in normal sinus rhythm the entire time in the emergency department.  She has had no ectopy or arrhythmias and has been asymptomatic from that standpoint.  Her troponins have been mildly elevated but consistent, likely due to chronic COPD effects.  PROCEDURES  Procedures   ED DIAGNOSES     ICD-10-CM   1. Palpitations  R00.2   2. Hyperglycemia  R73.9        Cynthia Cogle, Jenny Reichmann, MD 04/13/19 [phone]

## 2019-04-17 DIAGNOSIS — I471 Supraventricular tachycardia: Secondary | ICD-10-CM | POA: Insufficient documentation

## 2019-04-17 NOTE — Progress Notes (Signed)
Cardiology Office Note    Date:  04/18/2019   ID:  Robin Clayton, DOB February 15, 1956, MRN [id number]  PCP:  Bernerd Limbo, MD  Cardiologist: Fransico Him, MD EPS: None  No chief complaint on file.   History of Present Illness:  Robin Clayton is a 62 y.o. female with a hx of hypertension, bradycardia, morbid obesity, CKD III by COPD (followed by pulm), OSA intolerant to CPAP (on nocturnal O2), atrial tachycardia, transient complete heart block who presents for 6 month follow-up. She has history of palpitations with event monitor in 10/2017 demonstrating nonsustained atrial tachycardia up to 25 beats, sinus bradycardia to sinus tachycardia (30->130bpm), transient complete heart block with ventricular standstill for up to 4 seconds.   Was seen by Dr. Caryl Comes 12/18/2018 which time she was stable but he recommended verapamil/diltiazem as an alternative to amlodipine if she had recurrence.   Patient saw Dr. Radford Pax 03/21/19 with worsening dyspnea on exertion and increased use of inhalers. She was also intolerant to CPAP and was going to try it again.  Blood pressure was borderline control and she increased her losartan to 100 mg daily.  Patient went to the emergency room 04/13/2019 after awaking with palpitations and shortness of breath.  She had received Solu-Medrol IV 125 mg by EMS..  Symptoms resolved on arrival.  She was in normal sinus rhythm. Patient called the ambulance then next day and BP was 198/87, P 84. Had eaten a can of beets. Patient quit smoking 9 days ago and hasn't needed her inhaler in 3 days. Was using inhalers more than she was supposed to-5-6 times/day. Drinks 1 cup of coffee daily. Still having heart racing-sometimes wakes her up. Has caused her severe anxiety. Always thinking about it.  Past Medical History:  Diagnosis Date  . Atrial tachycardia ([place])   . Bradycardia   . Chronic bronchitis ([place])   . Chronic lower back pain   . CKD (chronic kidney disease), stage III   . Complete  heart block (HCC)    a. event monitor in 10/2017 demonstrating nonsustained atrial tachycardia up to 25 beats, sinus bradycardia to sinus tachycardia (30->130bpm), transient complete heart block with ventricular standstill for up to 4 seconds.  . Constipation   . COPD (chronic obstructive pulmonary disease) ([place])   . GERD (gastroesophageal reflux disease)    takes tums occ  . Hypertension   . Morbid obesity ([place])   . Obstructive sleep apnea    moderate OSA with AHI 24/hr intolerant to CPAP and could not afford oral device  . Type II diabetes mellitus ([place])     Past Surgical History:  Procedure Laterality Date  . BREAST BIOPSY  12/06/2011   Procedure: BREAST BIOPSY WITH NEEDLE LOCALIZATION;  Surgeon: Rolm Bookbinder, MD;  Location: [facility];  Service: General;  Laterality: Left;  . CESAREAN SECTION  1970    Current Medications: Current Meds  Medication Sig  . acetaminophen (TYLENOL) 650 MG CR tablet Take 650 mg by mouth as needed for pain.  Marland Kitchen albuterol (PROAIR HFA) 108 (90 BASE) MCG/ACT inhaler Inhale 2 puffs into the lungs every 6 (six) hours as needed for wheezing.  Marland Kitchen albuterol (PROVENTIL) (2.5 MG/3ML) 0.083% nebulizer solution Take 2.5 mg by nebulization every 4 (four) hours as needed (shortness of breath).   Marland Kitchen aspirin 81 MG tablet Take 81 mg by mouth daily.  Marland Kitchen atorvastatin (LIPITOR) 10 MG tablet Take 10 mg by mouth every evening.   . budesonide-formoterol (SYMBICORT) 80-4.5 MCG/ACT inhaler Take 2 puffs first  thing in am and then another 2 puffs about 12 hours later.  . cloNIDine (CATAPRES) 0.2 MG tablet Take 0.2 mg by mouth 3 (three) times daily.   . hydrochlorothiazide (MICROZIDE) 12.5 MG capsule Take 1 capsule by mouth daily.  Marland Kitchen losartan (COZAAR) 100 MG tablet Take 0.5 tablets (50 mg total) by mouth daily.  . metFORMIN (GLUCOPHAGE) 500 MG tablet Take 500 mg by mouth 2 (two) times daily.  . montelukast (SINGULAIR) 10 MG tablet Take 10 mg by mouth daily.  . pantoprazole (PROTONIX) 40  MG tablet Take 40 mg by mouth daily. 30-60 minutes before first meal of the day  . [DISCONTINUED] amLODipine (NORVASC) 10 MG tablet Take 10 mg by mouth daily.     Allergies:   Wound dressings   Social History   Socioeconomic History  . Marital status: Single    Spouse name: Not on file  . Number of children: 7  . Years of education: Not on file  . Highest education level: Not on file  Occupational History  . Occupation: Disabled  Tobacco Use  . Smoking status: Former Smoker    Packs/day: 0.50    Years: 38.00    Pack years: 19.00    Types: Cigarettes    Quit date: 05/03/2014    Years since quitting: 4.9  . Smokeless tobacco: Never Used  Substance and Sexual Activity  . Alcohol use: No  . Drug use: No  . Sexual activity: Never  Other Topics Concern  . Not on file  Social History Narrative  . Not on file   Social Determinants of Health   Financial Resource Strain:   . Difficulty of Paying Living Expenses: Not on file  Food Insecurity:   . Worried About Charity fundraiser in the Last Year: Not on file  . Ran Out of Food in the Last Year: Not on file  Transportation Needs:   . Lack of Transportation (Medical): Not on file  . Lack of Transportation (Non-Medical): Not on file  Physical Activity:   . Days of Exercise per Week: Not on file  . Minutes of Exercise per Session: Not on file  Stress:   . Feeling of Stress : Not on file  Social Connections:   . Frequency of Communication with Friends and Family: Not on file  . Frequency of Social Gatherings with Friends and Family: Not on file  . Attends Religious Services: Not on file  . Active Member of Clubs or Organizations: Not on file  . Attends Archivist Meetings: Not on file  . Marital Status: Not on file     Family History:  The patient's  family history includes Asthma in her sister; Cancer in her mother; Diabetes in her father and mother; Heart attack in her father; Hyperlipidemia in her mother;  Hypertension in her father and mother; Stomach cancer (age of onset: 61) in her mother.   ROS:   Please see the history of present illness.    ROS All other systems reviewed and are negative.   PHYSICAL EXAM:   VS:  BP 124/60   Pulse 76   Ht 5\' 3"  (1.6 m)   Wt 205 lb 9.6 oz (93.3 kg)   LMP 04/30/2012   BMI 36.42 kg/m   Physical Exam  [id number], in no acute distress  Neck: no JVD, carotid bruits, or masses Cardiac:RRR; no murmurs, rubs, or gallops  Respiratory: decreased breath sounds but clear to auscultation bilaterally, normal work of breathing GI: soft,  nontender, nondistended, + BS Ext: without cyanosis, clubbing, or edema, Good distal pulses bilaterally Neuro:  Alert and Oriented x 3 Psych: euthymic mood, full affect  Wt Readings from Last 3 Encounters:  04/18/19 205 lb 9.6 oz (93.3 kg)  04/13/19 211 lb (95.7 kg)  03/23/19 210 lb 6.4 oz (95.4 kg)      Studies/Labs Reviewed:   EKG:  EKG is not ordered today.   Recent Labs: 05/02/2018: Magnesium 2.2 03/21/2019: NT-Pro BNP 27; TSH 1.650 04/13/2019: BUN 18; Creatinine, Ser 1.28; Hemoglobin 12.5; Platelets 193; Potassium 4.0; Sodium 137   Lipid Panel    Component Value Date/Time   CHOL  01/05/2010 0355    184        ATP III CLASSIFICATION:  <200     mg/dL   Desirable  200-239  mg/dL   Borderline High  >=240    mg/dL   High          TRIG 62 01/05/2010 0355   HDL 57 01/05/2010 0355   CHOLHDL 3.2 01/05/2010 0355   VLDL 12 01/05/2010 0355   LDLCALC (H) 01/05/2010 0355    115        Total Cholesterol/HDL:CHD Risk Coronary Heart Disease Risk Table                     Men   Women  1/2 Average Risk   3.4   3.3  Average Risk       5.0   4.4  2 X Average Risk   9.6   7.1  3 X Average Risk  23.4   11.0        Use the calculated Patient Ratio above and the CHD Risk Table to determine the patient's CHD Risk.        ATP III CLASSIFICATION (LDL):  <100     mg/dL   Optimal  100-129  mg/dL   Near or Above                     Optimal  130-159  mg/dL   Borderline  160-189  mg/dL   High  >190     mg/dL   Very High    Additional studies/ records that were reviewed today include:  Echo 12/31/2019IMPRESSIONS      1. The left ventricle has hyperdynamic systolic function, with an ejection fraction of >65%. The cavity size was normal. Left ventricular diastolic Doppler parameters are consistent with impaired relaxation. Elevated left ventricular end-diastolic  pressure.  2. The right ventricle has normal systolic function. The cavity was normal. There is no increase in right ventricular wall thickness.  3. The aortic valve is tricuspid. Mild thickening of the aortic valve. Mild calcification of the aortic valve. Mild stenosis of the aortic valve.  4. The aortic root, aortic arch and ascending aorta are normal in size and structure.   FINDINGS  Left Ventricle: The left ventricle has hyperdynamic systolic function, with an ejection fraction of >65%. The cavity size was normal. There is no increase in left ventricular wall thickness. Left ventricular diastolic Doppler parameters are consistent  with impaired relaxation. Elevated left ventricular end-diastolic pressure   Right Ventricle: The right ventricle has normal systolic function. The cavity was normal. There is no increase in right ventricular wall thickness.   Left Atrium: Left atrial size was normal in size.   Right Atrium: Right atrial size was normal in size. Right atrial pressure is estimated at 3 mmHg.  Interatrial Septum: No atrial level shunt detected by color flow Doppler.   Pericardium: There is no evidence of pericardial effusion.   Mitral Valve: The mitral valve is normal in structure. Mitral valve regurgitation is mild by color flow Doppler.   Tricuspid Valve: The tricuspid valve is normal in structure. Tricuspid valve regurgitation is mild by color flow Doppler.   Aortic Valve: The aortic valve is tricuspid Mild thickening of the  aortic valve. Mild calcification of the aortic valve. Aortic valve regurgitation was not visualized by color flow Doppler. There is Mild stenosis of the aortic valve.   Pulmonic Valve: The pulmonic valve was normal in structure. Pulmonic valve regurgitation was not assessed by color flow Doppler.   Aorta: The aortic root, aortic arch and ascending aorta are normal in size and structure.   Venous: The inferior vena cava is normal in size with greater than 50% respiratory variability.      Monitor 5/8/2019Sinus bradycardia, normal sinus rhythm, sinus tachycardia. Heart rate ranged from 30 to 130 bpm.  Transient complete heart block with ventricular standstill for up to 4 seconds. Lowest heart rate 30 bpm. This occurred at 6:49 AM  Sustained atrial tachycardia up to 25 beats.       ASSESSMENT:    1. Atrial tachycardia ([place])   2. HYPERTENSION, BENIGN ESSENTIAL   3. Morbid obesity due to excess calories (HCC) c/b hbp, dm,hyperlipidemia    4. OSA (obstructive sleep apnea)      PLAN:  In order of problems listed above:  Palpitations with history of atrial tachycardia, transient complete heart block with ventricular standstill for up to 4 seconds on event monitor 10/2017.  When Dr. Caryl Comes saw her 12/18/2018 she was asymptomatic.  He said he would consider verapamil/diltiazem as an alternative to her amlodipine. Will stop amlodipine and start diltiazem 180 mg once daily. CPAP treatment will help. May need to have her wear a monitor. F/U Dr. Caryl Comes and Radford Pax.  Nocturnal bradycardia with hyper vagotonia likely secondary to sleep apnea-needs treated-  Essential hypertension BP runs high when she gets extra salt-2 gm sodium diet. BP good today  Morbid obesity-has lost 6 lbs   COPD followed by pulmonary  OSA  CPAP not working. Gae Bon working with her.    Medication Adjustments/Labs and Tests Ordered: Current medicines are reviewed at length with the patient today.  Concerns regarding  medicines are outlined above.  Medication changes, Labs and Tests ordered today are listed in the Patient Instructions below. Patient Instructions  Medication Instructions:  Your physician has recommended you make the following change in your medication:  1.  STOP Amlodipine 2.  START Diltiazem 180 mg taking 1 tablet daily    *If you need a refill on your cardiac medications before your next appointment, please call your pharmacy*  Lab Work: None ordered  If you have labs (blood work) drawn today and your tests are completely normal, you will receive your results only by: Marland Kitchen MyChart Message (if you have MyChart) OR . A paper copy in the mail If you have any lab test that is abnormal or we need to change your treatment, we will call you to review the results.  Testing/Procedures: None ordered  Follow-Up: At [facility], you and your health needs are our priority.  As part of our continuing mission to provide you with exceptional heart care, we have created designated Provider Care Teams.  These Care Teams include your primary Cardiologist (physician) and Advanced Practice Providers (APPs -  Physician Assistants and Nurse Practitioners) who all work together to provide you with the care you need, when you need it.  Your next appointment:   06/15/2019 9:00 ARRIVE AT 8:45 FOR REGISTRATION  The format for your next appointment:   IN PERSON  Provider:   DR. Radford Pax   Other Instructions  I have sent Dr. Olin Pia scheduler a message to contact you for his next available appointment...   DASH Eating Plan DASH stands for "Dietary Approaches to Stop Hypertension." The DASH eating plan is a healthy eating plan that has been shown to reduce high blood pressure (hypertension). It may also reduce your risk for type 2 diabetes, heart disease, and stroke. The DASH eating plan may also help with weight loss. What are tips for following this plan?  General guidelines  Avoid eating more than  2,300 mg (milligrams) of salt (sodium) a day. If you have hypertension, you may need to reduce your sodium intake to 1,500 mg a day.  Limit alcohol intake to no more than 1 drink a day for nonpregnant women and 2 drinks a day for men. One drink equals 12 oz of beer, 5 oz of wine, or 1 oz of hard liquor.  Work with your health care provider to maintain a healthy body weight or to lose weight. Ask what an ideal weight is for you.  Get at least 30 minutes of exercise that causes your heart to beat faster (aerobic exercise) most days of the week. Activities may include walking, swimming, or biking.  Work with your health care provider or diet and nutrition specialist (dietitian) to adjust your eating plan to your individual calorie needs. Reading food labels   Check food labels for the amount of sodium per serving. Choose foods with less than 5 percent of the Daily Value of sodium. Generally, foods with less than 300 mg of sodium per serving fit into this eating plan.  To find whole grains, look for the word "whole" as the first word in the ingredient list. Shopping  Buy products labeled as "low-sodium" or "no salt added."  Buy fresh foods. Avoid canned foods and premade or frozen meals. Cooking  Avoid adding salt when cooking. Use salt-free seasonings or herbs instead of table salt or sea salt. Check with your health care provider or pharmacist before using salt substitutes.  Do not fry foods. Cook foods using healthy methods such as baking, boiling, grilling, and broiling instead.  Cook with heart-healthy oils, such as olive, canola, soybean, or sunflower oil. Meal planning  Eat a balanced diet that includes: ? 5 or more servings of fruits and vegetables each day. At each meal, try to fill half of your plate with fruits and vegetables. ? Up to 6-8 servings of whole grains each day. ? Less than 6 oz of lean meat, poultry, or fish each day. A 3-oz serving of meat is about the same size  as a deck of cards. One egg equals 1 oz. ? 2 servings of low-fat dairy each day. ? A serving of nuts, seeds, or beans 5 times each week. ? Heart-healthy fats. Healthy fats called Omega-3 fatty acids are found in foods such as flaxseeds and coldwater fish, like sardines, salmon, and mackerel.  Limit how much you eat of the following: ? Canned or prepackaged foods. ? Food that is high in trans fat, such as fried foods. ? Food that is high in saturated fat, such as fatty meat. ? Sweets, desserts, sugary drinks, and other foods  with added sugar. ? Full-fat dairy products.  Do not salt foods before eating.  Try to eat at least 2 vegetarian meals each week.  Eat more home-cooked food and less restaurant, buffet, and fast food.  When eating at a restaurant, ask that your food be prepared with less salt or no salt, if possible. What foods are recommended? The items listed may not be a complete list. Talk with your dietitian about what dietary choices are best for you. Grains Whole-grain or whole-wheat bread. Whole-grain or whole-wheat pasta. Brown rice. [place]. Bulgur. Whole-grain and low-sodium cereals. Pita bread. Low-fat, low-sodium crackers. Whole-wheat flour tortillas. Vegetables Fresh or frozen vegetables (raw, steamed, roasted, or grilled). Low-sodium or reduced-sodium tomato and vegetable juice. Low-sodium or reduced-sodium tomato sauce and tomato paste. Low-sodium or reduced-sodium canned vegetables. Fruits All fresh, dried, or frozen fruit. Canned fruit in natural juice (without added sugar). Meat and other protein foods Skinless chicken or [place]. Ground chicken or [place]. Pork with fat trimmed off. Fish and seafood. Egg whites. Dried beans, peas, or lentils. Unsalted nuts, nut butters, and seeds. Unsalted canned beans. Lean cuts of beef with fat trimmed off. Low-sodium, lean deli meat. Dairy Low-fat (1%) or fat-free (skim) milk. Fat-free, low-fat, or reduced-fat cheeses.  Nonfat, low-sodium ricotta or cottage cheese. Low-fat or nonfat yogurt. Low-fat, low-sodium cheese. Fats and oils Soft margarine without trans fats. Vegetable oil. Low-fat, reduced-fat, or light mayonnaise and salad dressings (reduced-sodium). Canola, safflower, olive, soybean, and sunflower oils. Avocado. Seasoning and other foods Herbs. Spices. Seasoning mixes without salt. Unsalted popcorn and pretzels. Fat-free sweets. What foods are not recommended? The items listed may not be a complete list. Talk with your dietitian about what dietary choices are best for you. Grains Baked goods made with fat, such as croissants, muffins, or some breads. Dry pasta or rice meal packs. Vegetables Creamed or fried vegetables. Vegetables in a cheese sauce. Regular canned vegetables (not low-sodium or reduced-sodium). Regular canned tomato sauce and paste (not low-sodium or reduced-sodium). Regular tomato and vegetable juice (not low-sodium or reduced-sodium). Angie Fava. Olives. Fruits Canned fruit in a light or heavy syrup. Fried fruit. Fruit in cream or butter sauce. Meat and other protein foods Fatty cuts of meat. Ribs. Fried meat. Berniece Salines. Sausage. Bologna and other processed lunch meats. Salami. Fatback. Hotdogs. Bratwurst. Salted nuts and seeds. Canned beans with added salt. Canned or smoked fish. Whole eggs or egg yolks. Chicken or [place] with skin. Dairy Whole or 2% milk, cream, and half-and-half. Whole or full-fat cream cheese. Whole-fat or sweetened yogurt. Full-fat cheese. Nondairy creamers. Whipped toppings. Processed cheese and cheese spreads. Fats and oils Butter. Stick margarine. Lard. Shortening. Ghee. Bacon fat. Tropical oils, such as coconut, palm kernel, or palm oil. Seasoning and other foods Salted popcorn and pretzels. Onion salt, garlic salt, seasoned salt, table salt, and sea salt. Worcestershire sauce. Tartar sauce. Barbecue sauce. Teriyaki sauce. Soy sauce, including reduced-sodium. Steak  sauce. Canned and packaged gravies. Fish sauce. Oyster sauce. Cocktail sauce. Horseradish that you find on the shelf. Ketchup. Mustard. Meat flavorings and tenderizers. Bouillon cubes. Hot sauce and Tabasco sauce. Premade or packaged marinades. Premade or packaged taco seasonings. Relishes. Regular salad dressings. Where to find more information:  National Heart, Lung, and [place]: https://wilson-eaton.com/  American Heart Association: www.heart.org Summary  The DASH eating plan is a healthy eating plan that has been shown to reduce high blood pressure (hypertension). It may also reduce your risk for type 2 diabetes, heart disease, and stroke.  With the  DASH eating plan, you should limit salt (sodium) intake to 2,300 mg a day. If you have hypertension, you may need to reduce your sodium intake to 1,500 mg a day.  When on the DASH eating plan, aim to eat more fresh fruits and vegetables, whole grains, lean proteins, low-fat dairy, and heart-healthy fats.  Work with your health care provider or diet and nutrition specialist (dietitian) to adjust your eating plan to your individual calorie needs. This information is not intended to replace advice given to you by your health care provider. Make sure you discuss any questions you have with your health care provider. Document Released: 04/08/2011 Document Revised: 04/01/2017 Document Reviewed: 04/12/2016 Elsevier Patient Education  2020 [place], Ermalinda Barrios, [place]  04/18/2019 9:26 AM    [address], [place], [place]  [address] Phone: [phone]; Fax: [phone]

## 2019-04-18 ENCOUNTER — Ambulatory Visit: Payer: Medicaid Other | Admitting: Physician Assistant

## 2019-04-18 ENCOUNTER — Other Ambulatory Visit: Payer: Self-pay

## 2019-04-18 ENCOUNTER — Encounter: Payer: Self-pay | Admitting: Physician Assistant

## 2019-04-18 ENCOUNTER — Telehealth: Payer: Self-pay | Admitting: *Deleted

## 2019-04-18 VITALS — BP 124/60 | HR 76 | Ht 63.0 in | Wt 205.6 lb

## 2019-04-18 DIAGNOSIS — I10 Essential (primary) hypertension: Secondary | ICD-10-CM | POA: Diagnosis not present

## 2019-04-18 DIAGNOSIS — G4733 Obstructive sleep apnea (adult) (pediatric): Secondary | ICD-10-CM | POA: Diagnosis not present

## 2019-04-18 DIAGNOSIS — I471 Supraventricular tachycardia: Secondary | ICD-10-CM | POA: Diagnosis not present

## 2019-04-18 MED ORDER — DILTIAZEM HCL ER COATED BEADS 180 MG PO CP24: 180.0000 mg | ORAL_CAPSULE | Freq: Every day | ORAL | 3 refills | Status: DC

## 2019-04-18 NOTE — Telephone Encounter (Signed)
Referral placed to Ginette Pitman, DDS,PA.

## 2019-04-18 NOTE — Telephone Encounter (Signed)
-----   Message from Sueanne Margarita, MD sent at 04/18/2019 10:52 AM EST ----- Regarding: RE: [place] to refer to Augustina Mood, DDS for oral device  Traci ----- Message ----- From: Freada Bergeron, CMA Sent: 04/18/2019  10:00 AM EST To: Sueanne Margarita, MD Subject: ORAL DEVICE                                    She is still intolerant to PAP therapy and she still feels that the pressure is too much.   She wants to try the oral device and maybe set up payment arrangements. Please advise

## 2019-04-18 NOTE — Patient Instructions (Addendum)
Medication Instructions:  Your physician has recommended you make the following change in your medication:  1.  STOP Amlodipine 2.  START Diltiazem 180 mg taking 1 tablet daily    *If you need a refill on your cardiac medications before your next appointment, please call your pharmacy*  Lab Work: None ordered  If you have labs (blood work) drawn today and your tests are completely normal, you will receive your results only by: Marland Kitchen MyChart Message (if you have MyChart) OR . A paper copy in the mail If you have any lab test that is abnormal or we need to change your treatment, we will call you to review the results.  Testing/Procedures: None ordered  Follow-Up: At [facility], you and your health needs are our priority.  As part of our continuing mission to provide you with exceptional heart care, we have created designated Provider Care Teams.  These Care Teams include your primary Cardiologist (physician) and Advanced Practice Providers (APPs -  Physician Assistants and Nurse Practitioners) who all work together to provide you with the care you need, when you need it.  Your next appointment:   06/15/2019 9:00 ARRIVE AT 8:45 FOR REGISTRATION  The format for your next appointment:   IN PERSON  Provider:   DR. Radford Pax   Other Instructions  I have sent Dr. Olin Pia scheduler a message to contact you for his next available appointment...   DASH Eating Plan DASH stands for "Dietary Approaches to Stop Hypertension." The DASH eating plan is a healthy eating plan that has been shown to reduce high blood pressure (hypertension). It may also reduce your risk for type 2 diabetes, heart disease, and stroke. The DASH eating plan may also help with weight loss. What are tips for following this plan?  General guidelines  Avoid eating more than 2,300 mg (milligrams) of salt (sodium) a day. If you have hypertension, you may need to reduce your sodium intake to 1,500 mg a day.  Limit alcohol  intake to no more than 1 drink a day for nonpregnant women and 2 drinks a day for men. One drink equals 12 oz of beer, 5 oz of wine, or 1 oz of hard liquor.  Work with your health care provider to maintain a healthy body weight or to lose weight. Ask what an ideal weight is for you.  Get at least 30 minutes of exercise that causes your heart to beat faster (aerobic exercise) most days of the week. Activities may include walking, swimming, or biking.  Work with your health care provider or diet and nutrition specialist (dietitian) to adjust your eating plan to your individual calorie needs. Reading food labels   Check food labels for the amount of sodium per serving. Choose foods with less than 5 percent of the Daily Value of sodium. Generally, foods with less than 300 mg of sodium per serving fit into this eating plan.  To find whole grains, look for the word "whole" as the first word in the ingredient list. Shopping  Buy products labeled as "low-sodium" or "no salt added."  Buy fresh foods. Avoid canned foods and premade or frozen meals. Cooking  Avoid adding salt when cooking. Use salt-free seasonings or herbs instead of table salt or sea salt. Check with your health care provider or pharmacist before using salt substitutes.  Do not fry foods. Cook foods using healthy methods such as baking, boiling, grilling, and broiling instead.  Cook with heart-healthy oils, such as olive, canola, soybean, or  sunflower oil. Meal planning  Eat a balanced diet that includes: ? 5 or more servings of fruits and vegetables each day. At each meal, try to fill half of your plate with fruits and vegetables. ? Up to 6-8 servings of whole grains each day. ? Less than 6 oz of lean meat, poultry, or fish each day. A 3-oz serving of meat is about the same size as a deck of cards. One egg equals 1 oz. ? 2 servings of low-fat dairy each day. ? A serving of nuts, seeds, or beans 5 times each  week. ? Heart-healthy fats. Healthy fats called Omega-3 fatty acids are found in foods such as flaxseeds and coldwater fish, like sardines, salmon, and mackerel.  Limit how much you eat of the following: ? Canned or prepackaged foods. ? Food that is high in trans fat, such as fried foods. ? Food that is high in saturated fat, such as fatty meat. ? Sweets, desserts, sugary drinks, and other foods with added sugar. ? Full-fat dairy products.  Do not salt foods before eating.  Try to eat at least 2 vegetarian meals each week.  Eat more home-cooked food and less restaurant, buffet, and fast food.  When eating at a restaurant, ask that your food be prepared with less salt or no salt, if possible. What foods are recommended? The items listed may not be a complete list. Talk with your dietitian about what dietary choices are best for you. Grains Whole-grain or whole-wheat bread. Whole-grain or whole-wheat pasta. Brown rice. Modena Morrow. Bulgur. Whole-grain and low-sodium cereals. Pita bread. Low-fat, low-sodium crackers. Whole-wheat flour tortillas. Vegetables Fresh or frozen vegetables (raw, steamed, roasted, or grilled). Low-sodium or reduced-sodium tomato and vegetable juice. Low-sodium or reduced-sodium tomato sauce and tomato paste. Low-sodium or reduced-sodium canned vegetables. Fruits All fresh, dried, or frozen fruit. Canned fruit in natural juice (without added sugar). Meat and other protein foods Skinless chicken or [place]. Ground chicken or [place]. Pork with fat trimmed off. Fish and seafood. Egg whites. Dried beans, peas, or lentils. Unsalted nuts, nut butters, and seeds. Unsalted canned beans. Lean cuts of beef with fat trimmed off. Low-sodium, lean deli meat. Dairy Low-fat (1%) or fat-free (skim) milk. Fat-free, low-fat, or reduced-fat cheeses. Nonfat, low-sodium ricotta or cottage cheese. Low-fat or nonfat yogurt. Low-fat, low-sodium cheese. Fats and oils Soft margarine  without trans fats. Vegetable oil. Low-fat, reduced-fat, or light mayonnaise and salad dressings (reduced-sodium). Canola, safflower, olive, soybean, and sunflower oils. Avocado. Seasoning and other foods Herbs. Spices. Seasoning mixes without salt. Unsalted popcorn and pretzels. Fat-free sweets. What foods are not recommended? The items listed may not be a complete list. Talk with your dietitian about what dietary choices are best for you. Grains Baked goods made with fat, such as croissants, muffins, or some breads. Dry pasta or rice meal packs. Vegetables Creamed or fried vegetables. Vegetables in a cheese sauce. Regular canned vegetables (not low-sodium or reduced-sodium). Regular canned tomato sauce and paste (not low-sodium or reduced-sodium). Regular tomato and vegetable juice (not low-sodium or reduced-sodium). Angie Fava. Olives. Fruits Canned fruit in a light or heavy syrup. Fried fruit. Fruit in cream or butter sauce. Meat and other protein foods Fatty cuts of meat. Ribs. Fried meat. Berniece Salines. Sausage. Bologna and other processed lunch meats. Salami. Fatback. Hotdogs. Bratwurst. Salted nuts and seeds. Canned beans with added salt. Canned or smoked fish. Whole eggs or egg yolks. Chicken or [place] with skin. Dairy Whole or 2% milk, cream, and half-and-half. Whole or full-fat cream  cheese. Whole-fat or sweetened yogurt. Full-fat cheese. Nondairy creamers. Whipped toppings. Processed cheese and cheese spreads. Fats and oils Butter. Stick margarine. Lard. Shortening. Ghee. Bacon fat. Tropical oils, such as coconut, palm kernel, or palm oil. Seasoning and other foods Salted popcorn and pretzels. Onion salt, garlic salt, seasoned salt, table salt, and sea salt. Worcestershire sauce. Tartar sauce. Barbecue sauce. Teriyaki sauce. Soy sauce, including reduced-sodium. Steak sauce. Canned and packaged gravies. Fish sauce. Oyster sauce. Cocktail sauce. Horseradish that you find on the shelf. Ketchup.  Mustard. Meat flavorings and tenderizers. Bouillon cubes. Hot sauce and Tabasco sauce. Premade or packaged marinades. Premade or packaged taco seasonings. Relishes. Regular salad dressings. Where to find more information:  National Heart, Lung, and [place]: https://wilson-eaton.com/  American Heart Association: www.heart.org Summary  The DASH eating plan is a healthy eating plan that has been shown to reduce high blood pressure (hypertension). It may also reduce your risk for type 2 diabetes, heart disease, and stroke.  With the DASH eating plan, you should limit salt (sodium) intake to 2,300 mg a day. If you have hypertension, you may need to reduce your sodium intake to 1,500 mg a day.  When on the DASH eating plan, aim to eat more fresh fruits and vegetables, whole grains, lean proteins, low-fat dairy, and heart-healthy fats.  Work with your health care provider or diet and nutrition specialist (dietitian) to adjust your eating plan to your individual calorie needs. This information is not intended to replace advice given to you by your health care provider. Make sure you discuss any questions you have with your health care provider. Document Released: 04/08/2011 Document Revised: 04/01/2017 Document Reviewed: 04/12/2016 Elsevier Patient Education  2020 [employer].

## 2019-04-23 ENCOUNTER — Other Ambulatory Visit: Payer: Self-pay

## 2019-04-23 ENCOUNTER — Ambulatory Visit: Payer: Medicaid Other | Admitting: Internal Medicine

## 2019-04-23 ENCOUNTER — Encounter: Payer: Self-pay | Admitting: Internal Medicine

## 2019-04-23 DIAGNOSIS — R05 Cough: Secondary | ICD-10-CM

## 2019-04-23 DIAGNOSIS — R058 Other specified cough: Secondary | ICD-10-CM

## 2019-04-23 DIAGNOSIS — J449 Chronic obstructive pulmonary disease, unspecified: Secondary | ICD-10-CM | POA: Diagnosis not present

## 2019-04-23 NOTE — Progress Notes (Signed)
Subjective:    Patient ID: Robin Clayton, female    DOB: 08/26/1955  MRN: [id number]   Brief patient profile:  66 yobf  Quit smoking completely 04/11/2019  no problems until around 2000 referred to pulmonary clinic 05/08/2012 by Dr Billey Chang with baseline wt 150 but freq steroids required for flares of ab  to wt 253 at first pulmonary clinic ov and documented GOLD II copd 12/05/12    History of Present Illness  05/08/2012 1st ov/ Caterin Tabares/ cc 10 years of intermittent cough and sob responsive to short courses of prednisone progressively worse indolent onset doe even in absence of cough to point where sob across the parking lot then developed cp comes goes x 2 months lasting up to a half a day unless takes vicodin. Location is ant, generalized, assoc with overt HB, non-radiating, no worse walking. Coughing does not make it worse. rec Continue symbicort 160 Take 2 puffs first thing in am and then another 2 puffs about 12 hours later.  Pantoprazole 40 mg Take 30-60 min before first meal of the day  GERD  diet Please schedule a follow up office visit in 4 weeks, sooner if needed with pft's on return > did not return  09/15/2012 f/u ov/Theone Bowell copd/ 02 dep at baseline/ now on ACEi but quit smoking effective 08/31/12 Chief Complaint  Patient presents with  . Follow-up    Increased DOE x 2 wks, and wheezing for the past 3 days.   sob does improve with neb for an hour or two or rest  But aslo  wakes at night with hoarsness and dry cough day > niight Indolent onset, progressively worse. rec Pepcid 20 ac one at bedtime Stop lisinopril and start micardis 40 mg one daily  Plan A = automatic = symbicort 2 puff followed by one of tudorza twice daily until return Plan B = Backup = proaire up to every 4 hours only  Plan C = Nebulizer every 4 hours Prednisone 10 mg take  4 each am x 2 days,   2 each am x 2 days,  1 each am x2days and stop        03/05/2013 f/u ov/Penagos Cai re: COPD GOLD II Chief Complaint  Patient  presents with  . Follow-up    Pt states that her breathing is doing well. She uses albuterol inhaler approx 3 times per wk on average- mainly in the am's.     She is confusing symbicort and saba and turns out she's just using just the symbicort occ in ams maybe 3 x weekly s limiting doe rec Ok to just use the symbicort 160 up to 2 puffs every 12 hours as needed for any respiratory problems but if you start needing the rescue inhaler then really need to be more consistent with use of symbicort (not as needed)    07/10/2013 f/u ov/Dakari Stabler re: GOLD II COPD using symbicort 4 x a month / has had 02 x years for prn hs use Chief Complaint  Patient presents with  . Follow-up    Pt here to recertify for 02 qhs.  SOB with exertion, no other complaints.   really Not limited by breathing from desired activities   rec Ok to just use the symbicort 160 up to 2 puffs every 12 hours as needed for any respiratory problems but if you start needing the rescue inhaler then really need to be more consistent with use of symbicort (not as needed) 2 puffs perfectly regularly every 12 hours  Please see patient coordinator before you leave today  to schedule overnight 02 sats room air > did not do      12/03/2016  f/u ov/Kaianna Dolezal re:  Copd II/ needs clearance for R hip replacement  Chief Complaint  Patient presents with  . Pulmonary Consult    Pt states needing pulmonary clearance for total hip replacement- rt. She states her surgeon (can not recall his name at the time) noticed she had a cough and wanted her to be cleared. Pt states she has occ cough with clear sputum.  She has not needed albuterol inhaler or neb.   maint on symb 80 2bid and no need for saba - Not limited by breathing from desired activities  But by hips cough is random x one month, doesn't wake her up On protonix 40 mg randomly if takes at all  rec Work on inhaler technique:  Pantoprazole (protonix) 40 mg   Take  30-60 min before first meal of the day  and Pepcid (famotidine)  20 mg one @  bedtime until return to office - this is the best way to tell whether stomach acid is contributing to your problem.   GERD (REFLUX)  You are cleared for surgery  >  Had bilateral THR > much better mobility    03/23/2019  Ext ov/Xue Low re: re establish GOLD II copd with  worse sob/ cough x 3 m on symb  / protonix  Chief Complaint  Patient presents with  . Acute Visit    Patient reports that she's sob with exertion. She reports that she has alot of congestion in her chest and her sputum is brown in color.   Dyspnea:  Push cart at [employer] parking/ much worse x last week or two   Cough: worse also x sev weeks = brown esp in am  no abx or prednisone recently  Sleeping: can't do cpap x one year, on 2lpm hs and sleeps on L side 3 pillows and feels rested  SABA use: 4 x daily  02: 2lpm hs only  rec Symbicort 80 Take 2 puffs first thing in am and then another 2 puffs about 12 hours later.  Pantoprazole (protonix) 40 mg   Take  30-60 min before first meal of the day and Pepcid (famotidine)  20 mg one after supper  until return to office -   GERD Prednisone 10 mg take  4 each am x 2 days,   2 each am x 2 days,  1 each am x 2 days and stop  Zpak  For cough > mucinex dm up to 1200 mg every 12 hours as needed  Please remember to go to the  x-ray department  for your tests - we will call you with the results when they are available Please schedule a follow up office visit in 4 weeks, sooner if needed  with all medications /inhalers/ solutions in hand    04/23/2019  f/u ov/Valon Glasscock re: GOLD II/ much better off cigs stopped all inhalers and pepcid hs  Chief Complaint  Patient presents with  . Follow-up    Patient reports she still has sob with exertion. She reports that she has quit smoking for 12 days now.   Dyspnea:  Mopping /no steps = MMRC2 = can't walk a nl pace on a flat grade s sob but does fine slow and flat  Cough: resolved  Sleeping: on 2lpm / 3  pillows  SABA use: none at all / no  smb or neb  02: none beching p ppi s overt HB   No obvious day to day or daytime variability or assoc excess/ purulent sputum or mucus plugs or hemoptysis or cp or chest tightness, subjective wheeze or overt sinus or hb symptoms.   Sleeping fine now  without nocturnal  or early am exacerbation  of respiratory  c/o's or need for noct saba. Also denies any obvious fluctuation of symptoms with weather or environmental changes or other aggravating or alleviating factors except as outlined above   No unusual exposure hx or h/o childhood pna/ asthma or knowledge of premature birth.  Current Allergies, Complete Past Medical History, Past Surgical History, Family History, and Social History were reviewed in [employer] record.  ROS  The following are not active complaints unless bolded Hoarseness, sore throat, dysphagia, dental problems, itching, sneezing,  nasal congestion or discharge of excess mucus or purulent secretions, ear ache,   fever, chills, sweats, unintended wt loss or wt gain, classically pleuritic or exertional cp,  orthopnea pnd or arm/hand swelling  or leg swelling, presyncope, palpitations, abdominal pain, anorexia, nausea, vomiting, diarrhea  or change in bowel habits or change in bladder habits, change in stools or change in urine, dysuria, hematuria,  rash, arthralgias, visual complaints, headache, numbness, weakness or ataxia or problems with walking or coordination,  change in mood or  memory.        Current Meds  Medication Sig  . acetaminophen (TYLENOL) 650 MG CR tablet Take 650 mg by mouth as needed for pain.  Marland Kitchen albuterol (PROAIR HFA) 108 (90 BASE) MCG/ACT inhaler Inhale 2 puffs into the lungs every 6 (six) hours as needed for wheezing.  Marland Kitchen albuterol (PROVENTIL) (2.5 MG/3ML) 0.083% nebulizer solution Take 2.5 mg by nebulization every 4 (four) hours as needed (shortness of breath).   Marland Kitchen aspirin 81 MG tablet Take 81 mg by  mouth daily.  Marland Kitchen atorvastatin (LIPITOR) 10 MG tablet Take 10 mg by mouth every evening.   . budesonide-formoterol (SYMBICORT) 80-4.5 MCG/ACT inhaler Take 2 puffs first thing in am and then another 2 puffs about 12 hours later.  . cloNIDine (CATAPRES) 0.2 MG tablet Take 0.2 mg by mouth 3 (three) times daily.   Marland Kitchen diltiazem (CARDIZEM CD) 180 MG 24 hr capsule Take 1 capsule (180 mg total) by mouth daily.  . famotidine (PEPCID) 20 MG tablet Take 20 mg by mouth daily.  . hydrochlorothiazide (MICROZIDE) 12.5 MG capsule Take 1 capsule by mouth daily.  Marland Kitchen losartan (COZAAR) 100 MG tablet Take 0.5 tablets (50 mg total) by mouth daily.  . metFORMIN (GLUCOPHAGE) 500 MG tablet Take 500 mg by mouth 2 (two) times daily.  . montelukast (SINGULAIR) 10 MG tablet Take 10 mg by mouth daily.  . pantoprazole (PROTONIX) 40 MG tablet Take 40 mg by mouth daily. 30-60 minutes before first meal of the day                 Objective:   Physical Exam  amb bf with minimal pseudowheeze, better with plm    04/23/2019    207  03/23/2019    210  12/05/2012          223 > 217 03/05/2013 > 207 07/10/2013 > 01/03/2015   204 > 12/03/2016  190              09/15/12 245 lb (111.131 kg)  05/08/12 253 lb 3.2 oz (114.851 kg)  12/17/11 241 lb 4 oz (109.43 kg)  HEENT : pt wearing mask not removed for exam due to covid - 19 concerns.    NECK :  without JVD/Nodes/TM/ nl carotid upstrokes bilaterally   LUNGS: no acc muscle use,  Mild barrel  contour chest wall with bilateral  Distant bs s audible wheeze and  without cough on insp or exp maneuvers  and mild  Hyperresonant  to  percussion bilaterally     CV:  RRR  no s3 or murmur or increase in P2, and no edema   ABD:  obese soft and nontender with pos end  insp Hoover's  in the supine position. No bruits or organomegaly appreciated, bowel sounds nl  MS:   Nl gait/  ext warm without deformities, calf tenderness, cyanosis or clubbing No obvious joint restrictions   SKIN:  warm and dry without lesions    NEURO:  alert, approp, nl sensorium with  no motor or cerebellar deficits apparent.               I personally reviewed images and agree with radiology impression as follows:  CXR:   03/28/2019  Stable exam.  No active cardiopulmonary disease.        Assessment & Plan:

## 2019-04-23 NOTE — Assessment & Plan Note (Signed)
ACEi d/c    09/16/2012 due to cough - Added  back daily ppi as of 12/03/2016  - flared 12/2018 >>  reduced symb to 80 2bid 03/23/2019 and max rx for gerd  > changed to just pepcid 20 mg p bfast 04/23/2019 due to belching on PPI   Much better x for the belching ? Because the symb dose was reduced or the cigs were eliminated  rec try just the ppi and see if helps, otherwise > consider GI eval.

## 2019-04-23 NOTE — Assessment & Plan Note (Signed)
Quit smoking  04/11/2019 - ACEi d/c    09/16/2012 due to cough - 12/05/2012 PFTs FEV1 1.22 (58%)  64 and no better p B2,  DLCO 59 and 74% - 03/23/2019   try symb 80 2bid due to prominent hoarseness/ pseudowheezes> improved so stopped all inhalers by f/u 04/23/2019   - The proper method of use, as well as anticipated side effects, of a metered-dose inhaler were discussed and demonstrated to the patient.    Marked clinical improvement p stopped all "inhalers" including cigs so main issue is whether has enough lingering symptoms or tendency to aecopd to consider symbicort in any dose.  Based on two studies from NEJM  378; 20 p 1865 (2018) and 380 : p2020-30 (2019) in pts with mild asthma (this would fit as even if she has copd only has mild AB component)  it is reasonable to use low dose symbicort eg 80 2bid "prn" flare in this setting but I emphasized this was only shown with symbicort and takes advantage of the rapid onset of action but is not the same as "rescue therapy" but can be stopped once the acute symptoms have resolved and the need for rescue has been minimized (< 2 x weekly)    If not doing better on symb 80 2 bid first step is to increase back up to 160 2bid which is a more appropriate dose for COPD/ AB   Pt informed of the seriousness of COVID 19 infection as a direct risk to their health  and safey and to those of their loved ones and should continue to wear facemask in public and minimize exposure to public locations but especially avoid any area or activity where non-close contacts are not observing distancing or wearing an appropriate face mask.    >>>> f/u in 6 weeks sooner if needed.   I had an extended discussion with the patient reviewing all relevant studies completed to date and  lasting 15 to 20 minutes of a 25 minute visit    I performed detailed device teaching using a teach back method which extended face to face time for this visit (see above)  Each maintenance  medication was reviewed in detail including emphasizing most importantly the difference between maintenance and prns and under what circumstances the prns are to be triggered using an action plan format that is not reflected in the computer generated alphabetically organized AVS which I have not found useful in most complex patients, especially with respiratory illnesses  Please see AVS for specific instructions unique to this visit that I personally wrote and verbalized to the the pt in detail and then reviewed with pt  by my nurse highlighting any  changes in therapy recommended at today's visit to their plan of care.

## 2019-04-23 NOTE — Patient Instructions (Signed)
No change in medications except try stop pantoprazole and take pepcid 20 mg right after your first meal   If breathing or coughing get worse > resume symb 80 Take 2 puffs first thing in am and then another 2 puffs about 12 hours later.      Please schedule a follow up visit in 6 months but call sooner if needed - pfts on return

## 2019-05-01 ENCOUNTER — Other Ambulatory Visit: Payer: Self-pay

## 2019-05-01 ENCOUNTER — Ambulatory Visit (INDEPENDENT_AMBULATORY_CARE_PROVIDER_SITE_OTHER): Payer: Medicaid Other | Admitting: Internal Medicine

## 2019-05-01 ENCOUNTER — Encounter: Payer: Self-pay | Admitting: Internal Medicine

## 2019-05-01 VITALS — BP 138/66 | HR 88 | Ht 63.5 in | Wt 207.8 lb

## 2019-05-01 DIAGNOSIS — R002 Palpitations: Secondary | ICD-10-CM

## 2019-05-01 NOTE — Progress Notes (Signed)
Patient Care Team: Bernerd Limbo, MD as PCP - General (Family Medicine) Sueanne Margarita, MD as PCP - Cardiology (Cardiology)   HPI  Robin Clayton is a 63 y.o. female  Seen in followup for atrial tachycardia assoc with palpitations.  Monitors x 2 failed to demonstrate the arrhythmia  She has severe and untreated sleep apnea. Severe HTN and chronic kidney disease  At her last vistit we discussed changing her meds to include a rate controlling CCB; following 2 emergency room visits earlier this month for palpitations awaking her at night she was seen by Pershing Proud and diltiazem was started and amlodipine discontinued.   She has felt some better.  Continues to struggle with dyspnea.  No edema.  Intermittent atypical chest pains.      Date Cr K Hgb  12/19 1.2 4.6 13.4   12/20 1.28 4.0 12.5     DATE TEST EF   9/12 Echo   60 % Normal Wall Thickness  9/19 Echo  65% Normal Wall Thickness         Records and Results Reviewed   Past Medical History:  Diagnosis Date  . Atrial tachycardia ([place])   . Bradycardia   . Chronic bronchitis ([place])   . Chronic lower back pain   . CKD (chronic kidney disease), stage III   . Complete heart block (HCC)    a. event monitor in 10/2017 demonstrating nonsustained atrial tachycardia up to 25 beats, sinus bradycardia to sinus tachycardia (30->130bpm), transient complete heart block with ventricular standstill for up to 4 seconds.  . Constipation   . COPD (chronic obstructive pulmonary disease) ([place])   . GERD (gastroesophageal reflux disease)    takes tums occ  . Hypertension   . Morbid obesity ([place])   . Obstructive sleep apnea    moderate OSA with AHI 24/hr intolerant to CPAP and could not afford oral device  . Type II diabetes mellitus ([place])     Past Surgical History:  Procedure Laterality Date  . BREAST BIOPSY  12/06/2011   Procedure: BREAST BIOPSY WITH NEEDLE LOCALIZATION;  Surgeon: Rolm Bookbinder, MD;  Location: [place];   Service: General;  Laterality: Left;  . CESAREAN SECTION  1970    Current Meds  Medication Sig  . acetaminophen (TYLENOL) 650 MG CR tablet Take 650 mg by mouth as needed for pain.  Marland Kitchen albuterol (PROAIR HFA) 108 (90 BASE) MCG/ACT inhaler Inhale 2 puffs into the lungs every 6 (six) hours as needed for wheezing.  Marland Kitchen albuterol (PROVENTIL) (2.5 MG/3ML) 0.083% nebulizer solution Take 2.5 mg by nebulization every 4 (four) hours as needed (shortness of breath).   Marland Kitchen aspirin 81 MG tablet Take 81 mg by mouth daily.  Marland Kitchen atorvastatin (LIPITOR) 10 MG tablet Take 10 mg by mouth every evening.   . budesonide-formoterol (SYMBICORT) 80-4.5 MCG/ACT inhaler Take 2 puffs first thing in am and then another 2 puffs about 12 hours later.  . cloNIDine (CATAPRES) 0.2 MG tablet Take 0.2 mg by mouth 3 (three) times daily.   Marland Kitchen diltiazem (CARDIZEM CD) 180 MG 24 hr capsule Take 1 capsule (180 mg total) by mouth daily.  . famotidine (PEPCID) 20 MG tablet Take 20 mg by mouth daily.  . hydrochlorothiazide (MICROZIDE) 12.5 MG capsule Take 1 capsule by mouth daily.  Marland Kitchen losartan (COZAAR) 100 MG tablet Take 0.5 tablets (50 mg total) by mouth daily.  . metFORMIN (GLUCOPHAGE) 500 MG tablet Take 500 mg by mouth 2 (two) times daily.  Marland Kitchen  montelukast (SINGULAIR) 10 MG tablet Take 10 mg by mouth daily.    Allergies  Allergen Reactions  . Wound Dressings Swelling      Review of Systems negative except from HPI and PMH  Physical Exam BP 138/66   Pulse 88   Ht 5' 3.5" (1.613 m)   Wt 207 lb 12.8 oz (94.3 kg)   LMP 04/30/2012   SpO2 97%   BMI 36.23 kg/m   Well developed and Morbidly obese in no acute distress HENT normal Neck supple with JVP-flat Clear Regular rate and rhythm, no  murmur Abd-soft with active BS No Clubbing cyanosis  edema Skin-warm and dry A & Oriented  Grossly normal sensory and motor function    ECG personally reviewed from 12/11.  Sinus rhythm with no ectopy   Assessment and  Plan   Nocturnal  bradycardia with hyper vagotonia likely secondary to sleep apnea  Obstructive sleep apnea  Exertional tachypalpitations-intermittent  Brief palpitations likely sporadic ectopy  Hypertension-poorly controlled  Low voltage limb lead  Obesity  Anxiety   She is feeling better with the change from amlodipine to diltiazem.  She has had no objective arrhythmias on the last 2 monitor; she is averse to another monitor because of the difficulty tolerating the patches.  Chest pain is atypical.  Will defer to Dr. Radford Pax as to whether she wants to evaluate this further with CTA or Myoview if at all.  We will be glad to see her again as needed  Have suggested that she follow-up with her PCP regarding her anxiety.  We spent more than 50% of our >25 min visit in face to face counseling regarding the above

## 2019-05-01 NOTE — Patient Instructions (Signed)
Medication Instructions:  Your physician recommends that you continue on your current medications as directed. Please refer to the Current Medication list given to you today.  *If you need a refill on your cardiac medications before your next appointment, please call your pharmacy*  Lab Work: None ordered.  If you have labs (blood work) drawn today and your tests are completely normal, you will receive your results only by: Marland Kitchen MyChart Message (if you have MyChart) OR . A paper copy in the mail If you have any lab test that is abnormal or we need to change your treatment, we will call you to review the results.  Testing/Procedures: None ordered.   Follow-Up: Follow up with Dr Caryl Comes As needed. At [facility], you and your health needs are our priority.  As part of our continuing mission to provide you with exceptional heart care, we have created designated Provider Care Teams.  These Care Teams include your primary Cardiologist (physician) and Advanced Practice Providers (APPs -  Physician Assistants and Nurse Practitioners) who all work together to provide you with the care you need, when you need it.  Your next appointment:  Keep appointment scheduled with Dr Radford Pax

## 2019-05-02 ENCOUNTER — Telehealth: Payer: Self-pay

## 2019-05-02 NOTE — Telephone Encounter (Signed)
YOUR CARDIOLOGY TEAM HAS ARRANGED FOR AN E-VISIT FOR YOUR APPOINTMENT - PLEASE REVIEW IMPORTANT INFORMATION BELOW SEVERAL DAYS PRIOR TO YOUR APPOINTMENT  Due to the recent COVID-19 pandemic, we are transitioning in-person office visits to tele-medicine visits in an effort to decrease unnecessary exposure to our patients, their families, and staff. These visits are billed to your insurance just like a normal visit is. We also encourage you to sign up for MyChart if you have not already done so. You will need a smartphone if possible. For patients that do not have this, we can still complete the visit using a regular telephone but do prefer a smartphone to enable video when possible. You may have a family member that lives with you that can help. If possible, we also ask that you have a blood pressure cuff and scale at home to measure your blood pressure, heart rate and weight prior to your scheduled appointment. Patients with clinical needs that need an in-person evaluation and testing will still be able to come to the office if absolutely necessary. If you have any questions, feel free to call our office.   THE DAY OF YOUR APPOINTMENT  Approximately 15 minutes prior to your scheduled appointment, you will receive a telephone call from one of [facility] team - your caller ID may say "Unknown caller."  Our staff will confirm medications, vital signs for the day and any symptoms you may be experiencing. Please have this information available prior to the time of visit start. It may also be helpful for you to have a pad of paper and pen handy for any instructions given during your visit. They will also walk you through joining the smartphone meeting if this is a video visit.    CONSENT FOR TELE-HEALTH VISIT - PLEASE REVIEW  I hereby voluntarily request, consent and authorize [facility] and its employed or contracted physicians, physician assistants, nurse practitioners or other licensed health care  professionals (the Practitioner), to provide me with telemedicine health care services (the "Services") as deemed necessary by the treating Practitioner. I acknowledge and consent to receive the Services by the Practitioner via telemedicine. I understand that the telemedicine visit will involve communicating with the Practitioner through live audiovisual communication technology and the disclosure of certain medical information by electronic transmission. I acknowledge that I have been given the opportunity to request an in-person assessment or other available alternative prior to the telemedicine visit and am voluntarily participating in the telemedicine visit.  I understand that I have the right to withhold or withdraw my consent to the use of telemedicine in the course of my care at any time, without affecting my right to future care or treatment, and that the Practitioner or I may terminate the telemedicine visit at any time. I understand that I have the right to inspect all information obtained and/or recorded in the course of the telemedicine visit and may receive copies of available information for a reasonable fee.  I understand that some of the potential risks of receiving the Services via telemedicine include:  Marland Kitchen Delay or interruption in medical evaluation due to technological equipment failure or disruption; . Information transmitted may not be sufficient (e.g. poor resolution of images) to allow for appropriate medical decision making by the Practitioner; and/or  . In rare instances, security protocols could fail, causing a breach of personal health information.  Furthermore, I acknowledge that it is my responsibility to provide information about my medical history, conditions and care that is complete and  accurate to the best of my ability. I acknowledge that Practitioner's advice, recommendations, and/or decision may be based on factors not within their control, such as incomplete or inaccurate  data provided by me or distortions of diagnostic images or specimens that may result from electronic transmissions. I understand that the practice of medicine is not an exact science and that Practitioner makes no warranties or guarantees regarding treatment outcomes. I acknowledge that I will receive a copy of this consent concurrently upon execution via email to the email address I last provided but may also request a printed copy by calling the office of [facility].    I understand that my insurance will be billed for this visit.   I have read or had this consent read to me. . I understand the contents of this consent, which adequately explains the benefits and risks of the Services being provided via telemedicine.  . I have been provided ample opportunity to ask questions regarding this consent and the Services and have had my questions answered to my satisfaction. . I give my informed consent for the services to be provided through the use of telemedicine in my medical care  By participating in this telemedicine visit I agree to the above.

## 2019-05-02 NOTE — Telephone Encounter (Signed)
-----   Message from Sueanne Margarita, MD sent at 05/02/2019 11:36 AM EST ----- Please set up virtual visit with me next week to discuss CP that she told Dr. Caryl Comes about  Tressia Miners ----- Message ----- From: Deboraha Sprang, MD Sent: 05/01/2019  12:30 PM EST To: Sueanne Margarita, MD  Traci   atypical pain,  last myoveiw 2012   We will see her prn  If she has more symptoms, would mnitor again before need to see her again

## 2019-05-08 ENCOUNTER — Telehealth (INDEPENDENT_AMBULATORY_CARE_PROVIDER_SITE_OTHER): Payer: Medicaid Other | Admitting: Cardiology

## 2019-05-08 ENCOUNTER — Other Ambulatory Visit: Payer: Self-pay

## 2019-05-08 VITALS — BP 139/70 | HR 74 | Ht 63.5 in | Wt 207.0 lb

## 2019-05-08 DIAGNOSIS — I10 Essential (primary) hypertension: Secondary | ICD-10-CM | POA: Diagnosis not present

## 2019-05-08 DIAGNOSIS — G4733 Obstructive sleep apnea (adult) (pediatric): Secondary | ICD-10-CM

## 2019-05-08 DIAGNOSIS — I471 Supraventricular tachycardia: Secondary | ICD-10-CM | POA: Diagnosis not present

## 2019-05-08 DIAGNOSIS — R0600 Dyspnea, unspecified: Secondary | ICD-10-CM

## 2019-05-08 DIAGNOSIS — R0609 Other forms of dyspnea: Secondary | ICD-10-CM

## 2019-05-08 NOTE — Progress Notes (Signed)
Virtual Visit via Telephone Note   This visit type was conducted due to national recommendations for restrictions regarding the COVID-19 Pandemic (e.g. social distancing) in an effort to limit this patient's exposure and mitigate transmission in our community.  Due to her co-morbid illnesses, this patient is at least at moderate risk for complications without adequate follow up.  This format is felt to be most appropriate for this patient at this time.  The patient did not have access to video technology/had technical difficulties with video requiring transitioning to audio format only (telephone).  All issues noted in this document were discussed and addressed.  No physical exam could be performed with this format.  Please refer to the patient's chart for her  consent to telehealth for [facility].   Evaluation Performed:  Follow-up visit  This visit type was conducted due to national recommendations for restrictions regarding the COVID-19 Pandemic (e.g. social distancing).  This format is felt to be most appropriate for this patient at this time.  All issues noted in this document were discussed and addressed.  No physical exam was performed (except for noted visual exam findings with Video Visits).  Please refer to the patient's chart (MyChart message for video visits and phone note for telephone visits) for the patient's consent to telehealth for [facility].  Date:  05/08/2019   ID:  Robin Clayton, DOB 05/16/1955, MRN [id number]  Patient Location:  Home  Provider location:   [place]  PCP:  Bernerd Limbo, MD  Cardiologist:  Fransico Him, MD  Electrophysiologist:  None   Chief Complaint:  OSA, HTN  History of Present Illness:    Robin Clayton is a 64 y.o. female who presents via audio/video conferencing for a telehealth visit today.    Robin Clayton is a 64 y.o. female with a hx of hypertension, bradycardia, morbid obesity, CKD III by COPD (followed by pulm),OSA intolerant to  CPAP (on nocturnal O2), atrial tachycardia, transient complete heart block . She has history of palpitations with event monitor in 10/2017 demonstrating nonsustained atrial tachycardia up to 25 beats, sinus bradycardia to sinus tachycardia (30->130bpm), transient complete heart block with ventricular standstill for up to 4 seconds.  When I saw her last she had been intolerant to CPAP and was on nocturnal O2.  She could not afford the oral device and refused the Inspire device.  She was interested in getting back on PAP so we placed her back on auto settings from 4 to [place].  Her Losartan was also increased to 100mg  daily due to poorly controlled HTN.  She was last seen by Estella Husk, PA for palpitations awakening her at night and her amlodipine was changed to Diltiazem and referred to EP.  Dr. Caryl Comes saw her at the end of last month and was doing better with the medication change.  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  She was also complaining of atypical CP at that [place].  She is back today for followup.  She has quit smoking in November and her SOB has completely resolved and she has not had to use her inhaler.  She has not had any further palpitations after changing her amlodipine to diltiazem.  She tells me that she never had any CP or pressure.  Her chest discomfort was her palpitations which she has not had since changing meds.  She denies any LE edema,  syncope, PND, orthopnea.  She tried the CPAP device again but  was intolerant to it and now has decided that she wants to get the oral device.    The patient does not have symptoms concerning for COVID-19 infection (fever, chills, cough, or new shortness of breath).   Prior CV studies:   The following studies were reviewed today:  PAP compliance  Past Medical History:  Diagnosis Date  . Atrial tachycardia ([place])   . Bradycardia   . Chronic bronchitis ([place])   . Chronic lower back pain   . CKD  (chronic kidney disease), stage III   . Complete heart block (HCC)    a. event monitor in 10/2017 demonstrating nonsustained atrial tachycardia up to 25 beats, sinus bradycardia to sinus tachycardia (30->130bpm), transient complete heart block with ventricular standstill for up to 4 seconds.  . Constipation   . COPD (chronic obstructive pulmonary disease) ([place])   . GERD (gastroesophageal reflux disease)    takes tums occ  . Hypertension   . Morbid obesity ([place])   . Obstructive sleep apnea    moderate OSA with AHI 24/hr intolerant to CPAP and could not afford oral device  . Type II diabetes mellitus ([place])    Past Surgical History:  Procedure Laterality Date  . BREAST BIOPSY  12/06/2011   Procedure: BREAST BIOPSY WITH NEEDLE LOCALIZATION;  Surgeon: Rolm Bookbinder, MD;  Location: [place];  Service: General;  Laterality: Left;  . CESAREAN SECTION  1970     Current Meds  Medication Sig  . acetaminophen (TYLENOL) 650 MG CR tablet Take 650 mg by mouth as needed for pain.  Marland Kitchen albuterol (PROAIR HFA) 108 (90 BASE) MCG/ACT inhaler Inhale 2 puffs into the lungs every 6 (six) hours as needed for wheezing.  Marland Kitchen albuterol (PROVENTIL) (2.5 MG/3ML) 0.083% nebulizer solution Take 2.5 mg by nebulization every 4 (four) hours as needed (shortness of breath).   Marland Kitchen aspirin 81 MG tablet Take 81 mg by mouth daily.  Marland Kitchen atorvastatin (LIPITOR) 10 MG tablet Take 10 mg by mouth every evening.   . budesonide-formoterol (SYMBICORT) 80-4.5 MCG/ACT inhaler Take 2 puffs first thing in am and then another 2 puffs about 12 hours later.  . cloNIDine (CATAPRES) 0.2 MG tablet Take 0.2 mg by mouth 3 (three) times daily.   Marland Kitchen diltiazem (CARDIZEM CD) 180 MG 24 hr capsule Take 1 capsule (180 mg total) by mouth daily.  . famotidine (PEPCID) 20 MG tablet Take 20 mg by mouth daily.  . hydrochlorothiazide (MICROZIDE) 12.5 MG capsule Take 1 capsule by mouth daily.  Marland Kitchen losartan (COZAAR) 100 MG tablet Take 0.5 tablets (50 mg total) by mouth  daily.  . metFORMIN (GLUCOPHAGE) 500 MG tablet Take 500 mg by mouth 2 (two) times daily.  . montelukast (SINGULAIR) 10 MG tablet Take 10 mg by mouth daily.     Allergies:   Wound dressings   Social History   Tobacco Use  . Smoking status: Former Smoker    Packs/day: 0.50    Years: 38.00    Pack years: 19.00    Types: Cigarettes    Quit date: 04/11/2019    Years since quitting: 0.0  . Smokeless tobacco: Never Used  Substance Use Topics  . Alcohol use: No  . Drug use: No     Family Hx: The patient's family history includes Asthma in her sister; Cancer in her mother; Diabetes in her father and mother; Heart attack in her father; Hyperlipidemia in her mother; Hypertension in her father and mother; Stomach cancer (age of onset: 28) in her mother.  There is no history of Colon cancer.  ROS:   Please see the history of present illness.     All other systems reviewed and are negative.   Labs/Other Tests and Data Reviewed:    Recent Labs: 03/21/2019: NT-Pro BNP 27; TSH 1.650 04/13/2019: BUN 18; Creatinine, Ser 1.28; Hemoglobin 12.5; Platelets 193; Potassium 4.0; Sodium 137   Recent Lipid Panel Lab Results  Component Value Date/Time   CHOL  01/05/2010 03:55 AM    184        ATP III CLASSIFICATION:  <200     mg/dL   Desirable  200-239  mg/dL   Borderline High  >=240    mg/dL   High          TRIG 62 01/05/2010 03:55 AM   HDL 57 01/05/2010 03:55 AM   CHOLHDL 3.2 01/05/2010 03:55 AM   LDLCALC (H) 01/05/2010 03:55 AM    115        Total Cholesterol/HDL:CHD Risk Coronary Heart Disease Risk Table                     Men   Women  1/2 Average Risk   3.4   3.3  Average Risk       5.0   4.4  2 X Average Risk   9.6   7.1  3 X Average Risk  23.4   11.0        Use the calculated Patient Ratio above and the CHD Risk Table to determine the patient's CHD Risk.        ATP III CLASSIFICATION (LDL):  <100     mg/dL   Optimal  100-129  mg/dL   Near or Above                     Optimal  130-159  mg/dL   Borderline  160-189  mg/dL   High  >190     mg/dL   Very High    Wt Readings from Last 3 Encounters:  05/08/19 207 lb (93.9 kg)  05/01/19 207 lb 12.8 oz (94.3 kg)  04/23/19 207 lb 3.2 oz (94 kg)     Objective:    Vital Signs:  BP 139/70   Pulse 74   Ht 5' 3.5" (1.613 m)   Wt 207 lb (93.9 kg)   LMP 04/30/2012   BMI 36.09 kg/m    ASSESSMENT & PLAN:    1.  OSA -she retried the PAP but could not tolerate it -she would like reconsider the oral device so I will refer her back to Dr. Toy Cookey -she did not want the [facility] device.  2.  HTN -BP controlled -continue Clonidine 0.2mg  TID, Diltiazem 180mg  daily, HCTZ 12.5mg  daily and Losartan 25mg  daily -Creatinine was 1.28 in 04/2019  3.  Morbid Obesity -BMI>35 with co morbidities -I have encouraged her to get into a routine exercise program and cut back on carbs and portions.   4.  Nonsustained atrial tachycardia -continue Cardizem for suppression -she has not had any further palpitations   5.  SOB -this has completely resolved after stopping smoking  -she has not had to use her inhalers -denies any CP  COVID-19 Education: The signs and symptoms of COVID-19 were discussed with the patient and how to seek care for testing (follow up with PCP or arrange E-visit).  The importance of social distancing was discussed today.  Patient Risk:   After full review of this patient's clinical  status, I feel that they are at least moderate risk at this time.  Time:   Today, I have spent 20 minutes directly with the patient on telemedicine discussing medical problems including OSA< HTN, atrial tach.  We also reviewed the symptoms of COVID 19 and the ways to protect against contracting the virus with telehealth technology.  I spent an additional 5 minutes reviewing patient's chart including labs, ER records.  Medication Adjustments/Labs and Tests Ordered: Current medicines are reviewed at length with the patient  today.  Concerns regarding medicines are outlined above.  Tests Ordered: No orders of the defined types were placed in this encounter.  Medication Changes: No orders of the defined types were placed in this encounter.   Disposition:  Follow up in 6 month(s)  Signed, Fransico Him, MD  05/08/2019 10:39 AM    [facility]

## 2019-05-09 ENCOUNTER — Telehealth: Payer: Self-pay | Admitting: *Deleted

## 2019-05-09 NOTE — Telephone Encounter (Signed)
-----   Message from Sueanne Margarita, MD sent at 05/08/2019 10:37 AM EST ----- Please refer back to Dr. Toy Cookey DDS - she wants to proceed with oral device.  Followup with me virtual in 6 months

## 2019-05-09 NOTE — Telephone Encounter (Signed)
Referral placed to Ginette Pitman, DDS,PA. Again for evaluation of oral device.

## 2019-06-07 ENCOUNTER — Encounter (HOSPITAL_COMMUNITY): Payer: Self-pay | Admitting: Emergency Medicine

## 2019-06-07 ENCOUNTER — Emergency Department (HOSPITAL_COMMUNITY)
Admission: EM | Admit: 2019-06-07 | Discharge: 2019-06-07 | Disposition: A | Discharge status: Home / Self Care | Payer: Medicaid Other | Attending: Emergency Medicine | Admitting: Emergency Medicine

## 2019-06-07 ENCOUNTER — Emergency Department (HOSPITAL_COMMUNITY): Payer: Medicaid Other

## 2019-06-07 ENCOUNTER — Other Ambulatory Visit: Payer: Self-pay

## 2019-06-07 DIAGNOSIS — Z79899 Other long term (current) drug therapy: Secondary | ICD-10-CM | POA: Diagnosis not present

## 2019-06-07 DIAGNOSIS — N183 Chronic kidney disease, stage 3 unspecified: Secondary | ICD-10-CM | POA: Insufficient documentation

## 2019-06-07 DIAGNOSIS — M65811 Other synovitis and tenosynovitis, right shoulder: Secondary | ICD-10-CM | POA: Insufficient documentation

## 2019-06-07 DIAGNOSIS — Z87891 Personal history of nicotine dependence: Secondary | ICD-10-CM | POA: Insufficient documentation

## 2019-06-07 DIAGNOSIS — M7581 Other shoulder lesions, right shoulder: Secondary | ICD-10-CM

## 2019-06-07 DIAGNOSIS — Z7984 Long term (current) use of oral hypoglycemic drugs: Secondary | ICD-10-CM | POA: Diagnosis not present

## 2019-06-07 DIAGNOSIS — F419 Anxiety disorder, unspecified: Secondary | ICD-10-CM | POA: Diagnosis not present

## 2019-06-07 DIAGNOSIS — E1122 Type 2 diabetes mellitus with diabetic chronic kidney disease: Secondary | ICD-10-CM | POA: Insufficient documentation

## 2019-06-07 DIAGNOSIS — J449 Chronic obstructive pulmonary disease, unspecified: Secondary | ICD-10-CM | POA: Insufficient documentation

## 2019-06-07 DIAGNOSIS — I129 Hypertensive chronic kidney disease with stage 1 through stage 4 chronic kidney disease, or unspecified chronic kidney disease: Secondary | ICD-10-CM | POA: Insufficient documentation

## 2019-06-07 DIAGNOSIS — M25511 Pain in right shoulder: Secondary | ICD-10-CM | POA: Diagnosis present

## 2019-06-07 DIAGNOSIS — Z7982 Long term (current) use of aspirin: Secondary | ICD-10-CM | POA: Insufficient documentation

## 2019-06-07 DIAGNOSIS — I10 Essential (primary) hypertension: Secondary | ICD-10-CM

## 2019-06-07 MED ORDER — IBUPROFEN 200 MG PO TABS
400.0000 mg | ORAL_TABLET | Freq: Once | ORAL | Status: AC
  Administered 2019-06-07: 400 mg via ORAL
  Filled 2019-06-07: qty 2

## 2019-06-07 MED ORDER — CLONIDINE HCL 0.1 MG PO TABS
0.2000 mg | ORAL_TABLET | Freq: Once | ORAL | Status: AC
  Administered 2019-06-07: 0.2 mg via ORAL
  Filled 2019-06-07: qty 2

## 2019-06-07 MED ORDER — LIDOCAINE 5 % EX PTCH: 1.0000 | MEDICATED_PATCH | CUTANEOUS | 0 refills | Status: DC

## 2019-06-07 MED ORDER — ETODOLAC 300 MG PO CAPS: 300.0000 mg | ORAL_CAPSULE | Freq: Three times a day (TID) | ORAL | 0 refills | Status: DC

## 2019-06-07 NOTE — ED Triage Notes (Signed)
Patient comes from [facility] by [facility]. Patient is complaining of right shoulder pain that radiates to her back. Patient blood pressure is high. Patient thinks she is having anxiety attacks. Patient does not have medication for it.

## 2019-06-07 NOTE — ED Provider Notes (Signed)
[facility] DEPT Provider Note   CSN: [id number] Arrival date & time: 06/07/19  2137     History Chief Complaint  Patient presents with  . Hypertension  . Anxiety  . Shoulder Pain    Robin Clayton is a 64 y.o. female.  HPI   Patient presented to the ED for evaluation of right shoulder pain.  Patient states she started having sharp pain in her shoulder about a month ago.  It hurts with certain positions and movements.  This evening the pain was more intense.  She feels a catch in and it creaks when she moves certain ways.  Patient started to feel anxious and was concerned that her blood pressure was high.  She called the paramedics and they did note that her blood pressure was elevated.  Patient does take medications for her blood pressure.  She did not take her evening clonidine dose.  She denies any chest pain or shortness of breath.  No numbness or weakness.  Past Medical History:  Diagnosis Date  . Atrial tachycardia ([place])   . Bradycardia   . Chronic bronchitis ([place])   . Chronic lower back pain   . CKD (chronic kidney disease), stage III   . Complete heart block (HCC)    a. event monitor in 10/2017 demonstrating nonsustained atrial tachycardia up to 25 beats, sinus bradycardia to sinus tachycardia (30->130bpm), transient complete heart block with ventricular standstill for up to 4 seconds.  . Constipation   . COPD (chronic obstructive pulmonary disease) ([place])   . GERD (gastroesophageal reflux disease)    takes tums occ  . Hypertension   . Morbid obesity ([place])   . Obstructive sleep apnea    moderate OSA with AHI 24/hr intolerant to CPAP and could not afford oral device  . Type II diabetes mellitus [facility])     Patient Active Problem List   Diagnosis Date Noted  . Atrial tachycardia ([place]) 04/17/2019  . Upper airway cough syndrome 12/05/2016  . Heart palpitations 08/21/2015  . OSA (obstructive sleep apnea) 07/25/2013  . Nocturnal hypoxemia  07/10/2013  . Bradycardia 07/02/2011  . HYPERTHYROIDISM 01/28/2010  . Morbid obesity due to excess calories ([place]) c/b hbp, dm,hyperlipidemia  01/28/2010  . BACK PAIN 01/28/2010  . [place], MILD 01/03/2010  . HYPERTENSION, BENIGN ESSENTIAL 01/03/2010  . COPD GOLD II 01/03/2010    Past Surgical History:  Procedure Laterality Date  . BREAST BIOPSY  12/06/2011   Procedure: BREAST BIOPSY WITH NEEDLE LOCALIZATION;  Surgeon: Rolm Bookbinder, MD;  Location: [facility];  Service: General;  Laterality: Left;  . CESAREAN SECTION  1970     OB History   No obstetric history on file.     Family History  Problem Relation Age of Onset  . Stomach cancer Mother 77  . Hypertension Mother   . Hyperlipidemia Mother   . Diabetes Mother   . Cancer Mother        stomach  . Hypertension Father   . Diabetes Father   . Heart attack Father   . Asthma Sister   . Colon cancer Neg Hx     Social History   Tobacco Use  . Smoking status: Former Smoker    Packs/day: 0.50    Years: 38.00    Pack years: 19.00    Types: Cigarettes    Quit date: 04/11/2019    Years since quitting: 0.1  . Smokeless tobacco: Never Used  Substance Use Topics  . Alcohol use: No  .  Drug use: No    Home Medications Prior to Admission medications   Medication Sig Start Date End Date Taking? Authorizing Provider  acetaminophen (TYLENOL) 650 MG CR tablet Take 650 mg by mouth as needed for pain.    [provider]  albuterol (PROAIR HFA) 108 (90 BASE) MCG/ACT inhaler Inhale 2 puffs into the lungs every 6 (six) hours as needed for wheezing.    [provider]  albuterol (PROVENTIL) (2.5 MG/3ML) 0.083% nebulizer solution Take 2.5 mg by nebulization every 4 (four) hours as needed (shortness of breath).  05/14/14   [provider]  aspirin 81 MG tablet Take 81 mg by mouth daily.    [provider]  atorvastatin (LIPITOR) 10 MG tablet Take 10 mg by mouth every evening.  09/25/12   [provider]  budesonide-formoterol (SYMBICORT) 80-4.5 MCG/ACT inhaler Take 2 puffs first thing in am and then another 2 puffs about 12 hours later. 03/23/19   [name], MD  cloNIDine (CATAPRES) 0.2 MG tablet Take 0.2 mg by mouth 3 (three) times daily.     [provider]  diltiazem (CARDIZEM CD) 180 MG 24 hr capsule Take 1 capsule (180 mg total) by mouth daily. 04/18/19 07/17/19  Imogene Burn, PA-C  etodolac (LODINE) 300 MG capsule Take 1 capsule (300 mg total) by mouth every 8 (eight) hours. 06/07/19   Dorie Rank, MD  famotidine (PEPCID) 20 MG tablet Take 20 mg by mouth daily.    [provider]  hydrochlorothiazide (MICROZIDE) 12.5 MG capsule Take 1 capsule by mouth daily. 03/10/18   [provider]  lidocaine (LIDODERM) 5 % Place 1 patch onto the skin daily. Remove & Discard patch within 12 hours or as directed by MD 06/07/19   Dorie Rank, MD  losartan (COZAAR) 100 MG tablet Take 0.5 tablets (50 mg total) by mouth daily. 04/05/19 07/04/19  Sueanne Margarita, MD  metFORMIN (GLUCOPHAGE) 500 MG tablet Take 500 mg by mouth 2 (two) times daily. 06/16/17   [provider]  montelukast (SINGULAIR) 10 MG tablet Take 10 mg by mouth daily. 03/08/19   [provider]    Allergies    Wound dressings  Review of Systems   Review of Systems  All other systems reviewed and are negative.   Physical Exam Updated Vital Signs BP (!) 159/90 (BP Location: Left Arm)   Pulse 83   Temp 98.3 F (36.8 C) (Oral)   Resp 16   Ht 1.626 m (5\' 4" )   Wt 95.3 kg   LMP 04/30/2012   SpO2 99%   BMI 36.05 kg/m   Physical Exam Vitals and nursing note reviewed.  Constitutional:      General: She is not in acute distress.    Appearance: She is well-developed.  HENT:     Head: Normocephalic and atraumatic.     Right Ear: External ear normal.     Left Ear: External ear normal.  Eyes:     General: No scleral icterus.       Right eye: No discharge.        Left eye:  No discharge.     Conjunctiva/sclera: Conjunctivae normal.  Neck:     Trachea: No tracheal deviation.  Cardiovascular:     Rate and Rhythm: Normal rate and regular rhythm.  Pulmonary:     Effort: Pulmonary effort is normal. No respiratory distress.     Breath sounds: Normal breath sounds. No stridor. No wheezing or rales.  Abdominal:     General: Bowel sounds are normal. There is no distension.     Palpations: Abdomen is soft.     Tenderness: There is no abdominal tenderness. There is no guarding or rebound.  Musculoskeletal:        General: No tenderness.     Right shoulder: No deformity, bony tenderness or crepitus. Decreased range of motion.     Cervical back: Neck supple.     Comments: Pain with range of motion of the right shoulder, tenderness palpation periscapular region  Skin:    General: Skin is warm and dry.     Findings: No rash.  Neurological:     Mental Status: She is alert.     Cranial Nerves: No cranial nerve deficit (no facial droop, extraocular movements intact, no slurred speech).     Sensory: No sensory deficit.     Motor: No abnormal muscle tone or seizure activity.     Coordination: Coordination normal.     ED Results / Procedures / Treatments   Labs (all labs ordered are listed, but only abnormal results are displayed) Labs Reviewed - No data to display  EKG None  Radiology DG Shoulder Right  Result Date: 06/07/2019 CLINICAL DATA:  Right shoulder pain. EXAM: RIGHT SHOULDER - 2+ VIEW COMPARISON:  None. FINDINGS: There is no acute displaced fracture or dislocation. There are degenerative changes of the right AC joint with an inferiorly oriented osteophyte. Mild degenerative changes are noted of the right glenohumeral joint. There is a 1.6 cm calcification adjacent to the proximal humerus likely related to an old remote injury. Alternatively, this could represent a small loose body in the biceps tendon tract. IMPRESSION: 1. No acute displaced fracture or  dislocation. 2. Degenerative changes of the right AC joint and glenohumeral joint. 3. Possible loose body in the biceps tendon tract versus calcification related to an old remote injury. Electronically Signed   By: Constance Holster M.D.   On: 06/07/2019 22:45    Procedures Procedures (including critical care time)  Medications Ordered in ED Medications  cloNIDine (CATAPRES) tablet 0.2 mg (has no administration in time range)  ibuprofen (ADVIL) tablet 400 mg (has no administration in time range)    ED Course  I have reviewed the triage vital signs and the nursing notes.  Pertinent labs & imaging results that were available during my care of the patient were reviewed by me and considered in my medical decision making (see chart for details).    MDM Rules/Calculators/A&P                      Symptoms are consistent with rotator cuff tendinitis.  She has focal tenderness in her arm movement reproduces her pain.  X-rays are consistent with rotator cuff tendinitis will discharge home with anti-inflammatory pain medications and loading.  Patient's blood pressure is mildly elevated but I am not seeing any signs to suggest any emergent complication.  Symptoms do not sound to be cardiac in nature.  Not suggestive of aortic dissection. Final Clinical Impression(s) / ED Diagnoses Final diagnoses:  Tendinitis of right rotator cuff  Hypertension, unspecified type    Rx / DC Orders ED Discharge Orders         Ordered    lidocaine (LIDODERM) 5 %  Every 24 hours     06/07/19 2333    etodolac (LODINE) 300 MG capsule  Every 8 hours    Note to Pharmacy: As needed for pain  06/07/19 2333           Dorie Rank, MD 06/07/19 [id number]

## 2019-06-07 NOTE — Discharge Instructions (Signed)
Take the medications as needed for pain.  Follow-up with your orthopedic doctor for further evaluation

## 2019-06-08 ENCOUNTER — Emergency Department (HOSPITAL_COMMUNITY)
Admission: EM | Admit: 2019-06-08 | Discharge: 2019-06-08 | Disposition: A | Discharge status: Home / Self Care | Payer: Medicaid Other | Attending: Emergency Medicine | Admitting: Emergency Medicine

## 2019-06-08 DIAGNOSIS — Z7984 Long term (current) use of oral hypoglycemic drugs: Secondary | ICD-10-CM | POA: Diagnosis not present

## 2019-06-08 DIAGNOSIS — I1310 Hypertensive heart and chronic kidney disease without heart failure, with stage 1 through stage 4 chronic kidney disease, or unspecified chronic kidney disease: Secondary | ICD-10-CM | POA: Insufficient documentation

## 2019-06-08 DIAGNOSIS — E119 Type 2 diabetes mellitus without complications: Secondary | ICD-10-CM | POA: Diagnosis not present

## 2019-06-08 DIAGNOSIS — N183 Chronic kidney disease, stage 3 unspecified: Secondary | ICD-10-CM | POA: Diagnosis not present

## 2019-06-08 DIAGNOSIS — Z87891 Personal history of nicotine dependence: Secondary | ICD-10-CM | POA: Diagnosis not present

## 2019-06-08 DIAGNOSIS — J449 Chronic obstructive pulmonary disease, unspecified: Secondary | ICD-10-CM | POA: Insufficient documentation

## 2019-06-08 DIAGNOSIS — I10 Essential (primary) hypertension: Secondary | ICD-10-CM

## 2019-06-08 NOTE — ED Triage Notes (Addendum)
Per [facility] patient comes to ED for HTN. EMS states patient is having hard time managing HTN. Patient was evaluated by EMS last night as well. Today on arrival , EMS states patient had SBP of 260. EMS states patient has no other symptoms just having hard time managing medications. EMS states upon arrival the patient was on the phone with her PCP trying to make an appt with PCP, and PCP told patient to just come to the ED.

## 2019-06-08 NOTE — ED Provider Notes (Signed)
[facility] DEPT Provider Note   CSN: [id number] Arrival date & time: 06/08/19  1349     History Chief Complaint  Patient presents with  . Hypertension    Robin Clayton is a 64 y.o. female.  The history is provided by the patient and medical records. No language interpreter was used.  Hypertension     64 year old female with history of hypertension currently on diltiazem, losartan, clonidine, and hydrochlorothiazide brought here via EMS with concerns of high blood pressure.  Patient states for the past 3 months she has noticed that her blood pressure is not well controlled.  It is usually about 123XX123 systolic.  This morning she noticed some mild lightheadedness sensation and decided to check her blood pressure.  States that blood pressure was about 0000000 systolic.  She ate her breakfast, and rechecked it again and states that it was 123XX123 systolic.  She have not take her usual blood pressure medication but did reach out to talk to her doctor and was recommended to come to the ER.  She did not complain of any symptoms and headache, chest pain, focal numbness or focal weakness or confusion.  She denies any change in her dietary status.  She did mention that she is usually pretty compliant with her blood pressure medication and and she does have a follow-up appointment with her PCP in 5 days.  Past Medical History:  Diagnosis Date  . Atrial tachycardia ([place])   . Bradycardia   . Chronic bronchitis ([place])   . Chronic lower back pain   . CKD (chronic kidney disease), stage III   . Complete heart block (HCC)    a. event monitor in 10/2017 demonstrating nonsustained atrial tachycardia up to 25 beats, sinus bradycardia to sinus tachycardia (30->130bpm), transient complete heart block with ventricular standstill for up to 4 seconds.  . Constipation   . COPD (chronic obstructive pulmonary disease) ([place])   . GERD (gastroesophageal reflux disease)    takes tums occ  .  Hypertension   . Morbid obesity ([place])   . Obstructive sleep apnea    moderate OSA with AHI 24/hr intolerant to CPAP and could not afford oral device  . Type II diabetes mellitus [facility])     Patient Active Problem List   Diagnosis Date Noted  . Atrial tachycardia ([place]) 04/17/2019  . Upper airway cough syndrome 12/05/2016  . Heart palpitations 08/21/2015  . OSA (obstructive sleep apnea) 07/25/2013  . Nocturnal hypoxemia 07/10/2013  . Bradycardia 07/02/2011  . HYPERTHYROIDISM 01/28/2010  . Morbid obesity due to excess calories ([place]) c/b hbp, dm,hyperlipidemia  01/28/2010  . BACK PAIN 01/28/2010  . [place], MILD 01/03/2010  . HYPERTENSION, BENIGN ESSENTIAL 01/03/2010  . COPD GOLD II 01/03/2010    Past Surgical History:  Procedure Laterality Date  . BREAST BIOPSY  12/06/2011   Procedure: BREAST BIOPSY WITH NEEDLE LOCALIZATION;  Surgeon: Rolm Bookbinder, MD;  Location: [facility];  Service: General;  Laterality: Left;  . CESAREAN SECTION  1970     OB History   No obstetric history on file.     Family History  Problem Relation Age of Onset  . Stomach cancer Mother 26  . Hypertension Mother   . Hyperlipidemia Mother   . Diabetes Mother   . Cancer Mother        stomach  . Hypertension Father   . Diabetes Father   . Heart attack Father   . Asthma Sister   . Colon cancer Neg Hx  Social History   Tobacco Use  . Smoking status: Former Smoker    Packs/day: 0.50    Years: 38.00    Pack years: 19.00    Types: Cigarettes    Quit date: 04/11/2019    Years since quitting: 0.1  . Smokeless tobacco: Never Used  Substance Use Topics  . Alcohol use: No  . Drug use: No    Home Medications Prior to Admission medications   Medication Sig Start Date End Date Taking? Authorizing Provider  acetaminophen (TYLENOL) 650 MG CR tablet Take 650 mg by mouth as needed for pain.    [provider]  albuterol (PROAIR HFA) 108 (90 BASE) MCG/ACT inhaler Inhale 2 puffs into the  lungs every 6 (six) hours as needed for wheezing.    [provider]  albuterol (PROVENTIL) (2.5 MG/3ML) 0.083% nebulizer solution Take 2.5 mg by nebulization every 4 (four) hours as needed (shortness of breath).  05/14/14   [provider]  aspirin 81 MG tablet Take 81 mg by mouth daily.    [provider]  atorvastatin (LIPITOR) 10 MG tablet Take 10 mg by mouth every evening.  09/25/12   [provider]  budesonide-formoterol (SYMBICORT) 80-4.5 MCG/ACT inhaler Take 2 puffs first thing in am and then another 2 puffs about 12 hours later. 03/23/19   [name], MD  cloNIDine (CATAPRES) 0.2 MG tablet Take 0.2 mg by mouth 3 (three) times daily.     [provider]  diltiazem (CARDIZEM CD) 180 MG 24 hr capsule Take 1 capsule (180 mg total) by mouth daily. 04/18/19 07/17/19  Imogene Burn, PA-C  etodolac (LODINE) 300 MG capsule Take 1 capsule (300 mg total) by mouth every 8 (eight) hours. 06/07/19   Dorie Rank, MD  famotidine (PEPCID) 20 MG tablet Take 20 mg by mouth daily.    [provider]  hydrochlorothiazide (MICROZIDE) 12.5 MG capsule Take 1 capsule by mouth daily. 03/10/18   [provider]  lidocaine (LIDODERM) 5 % Place 1 patch onto the skin daily. Remove & Discard patch within 12 hours or as directed by MD 06/07/19   Dorie Rank, MD  losartan (COZAAR) 100 MG tablet Take 0.5 tablets (50 mg total) by mouth daily. 04/05/19 07/04/19  Sueanne Margarita, MD  metFORMIN (GLUCOPHAGE) 500 MG tablet Take 500 mg by mouth 2 (two) times daily. 06/16/17   [provider]  montelukast (SINGULAIR) 10 MG tablet Take 10 mg by mouth daily. 03/08/19   [provider]    Allergies    Wound dressings  Review of Systems   Review of Systems  All other systems reviewed and are negative.   Physical Exam Updated Vital Signs BP (!) 160/92 (BP Location: Right Arm)   Pulse 85   Temp 98.7 F (37.1 C) (Oral)   Resp 18   Ht 5\' 4"  (1.626 m)    Wt 95.3 kg   LMP 04/30/2012   SpO2 95%   BMI 36.05 kg/m   Physical Exam Vitals and nursing note reviewed.  Constitutional:      General: She is not in acute distress.    Appearance: She is well-developed.  HENT:     Head: Atraumatic.  Eyes:     Conjunctiva/sclera: Conjunctivae normal.  Cardiovascular:     Rate and Rhythm: Normal rate and regular rhythm.     Pulses: Normal pulses.     Heart sounds: Normal heart sounds.  Pulmonary:     Effort: Pulmonary effort is  normal.     Breath sounds: Normal breath sounds.  Abdominal:     Palpations: Abdomen is soft.     Tenderness: There is no abdominal tenderness.  Musculoskeletal:     Cervical back: Neck supple.  Skin:    Findings: No rash.  Neurological:     Mental Status: She is alert and oriented to person, place, and time.     ED Results / Procedures / Treatments   Labs (all labs ordered are listed, but only abnormal results are displayed) Labs Reviewed - No data to display  EKG None  Radiology DG Shoulder Right  Result Date: 06/07/2019 CLINICAL DATA:  Right shoulder pain. EXAM: RIGHT SHOULDER - 2+ VIEW COMPARISON:  None. FINDINGS: There is no acute displaced fracture or dislocation. There are degenerative changes of the right AC joint with an inferiorly oriented osteophyte. Mild degenerative changes are noted of the right glenohumeral joint. There is a 1.6 cm calcification adjacent to the proximal humerus likely related to an old remote injury. Alternatively, this could represent a small loose body in the biceps tendon tract. IMPRESSION: 1. No acute displaced fracture or dislocation. 2. Degenerative changes of the right AC joint and glenohumeral joint. 3. Possible loose body in the biceps tendon tract versus calcification related to an old remote injury. Electronically Signed   By: Constance Holster M.D.   On: 06/07/2019 22:45    Procedures Procedures (including critical care time)  Medications Ordered in  ED Medications - No data to display  ED Course  I have reviewed the triage vital signs and the nursing notes.  Pertinent labs & imaging results that were available during my care of the patient were reviewed by me and considered in my medical decision making (see chart for details).    MDM Rules/Calculators/A&P                      BP (!) 160/92 (BP Location: Right Arm)   Pulse 85   Temp 98.7 F (37.1 C) (Oral)   Resp 18   Ht 5\' 4"  (1.626 m)   Wt 95.3 kg   LMP 04/30/2012   SpO2 95%   BMI 36.05 kg/m   Final Clinical Impression(s) / ED Diagnoses Final diagnoses:  Essential hypertension    Rx / DC Orders ED Discharge Orders    None     2:55 PM Patient voiced concern of her elevated blood pressure.  She is currently on 4 different blood pressure medications.  Her blood pressure today is initially 160/92, currently recheck it was [id number] systolic.  She did not have any concerning feature to suggest stroke or heart attack from high blood pressure.  At this time I felt patient is stable for discharge with close follow-up with PCP for further management of her hypertension.  Return precaution discussed.   Domenic Moras, PA-C 06/08/19 1508    Lacretia Leigh, MD 06/09/19 1230

## 2019-06-08 NOTE — Discharge Instructions (Signed)
Please call and follow up with your doctor for further management of your blood pressure.

## 2019-06-15 ENCOUNTER — Ambulatory Visit: Payer: Medicaid Other | Admitting: Cardiology

## 2019-07-31 ENCOUNTER — Other Ambulatory Visit: Payer: Self-pay

## 2019-07-31 ENCOUNTER — Emergency Department (HOSPITAL_COMMUNITY): Payer: Medicaid Other

## 2019-07-31 ENCOUNTER — Emergency Department (HOSPITAL_COMMUNITY)
Admission: EM | Admit: 2019-07-31 | Discharge: 2019-07-31 | Disposition: A | Discharge status: Home / Self Care | Payer: Medicaid Other | Attending: Emergency Medicine | Admitting: Emergency Medicine

## 2019-07-31 DIAGNOSIS — Z7982 Long term (current) use of aspirin: Secondary | ICD-10-CM | POA: Diagnosis not present

## 2019-07-31 DIAGNOSIS — Z87891 Personal history of nicotine dependence: Secondary | ICD-10-CM | POA: Insufficient documentation

## 2019-07-31 DIAGNOSIS — I10 Essential (primary) hypertension: Secondary | ICD-10-CM

## 2019-07-31 DIAGNOSIS — Z7984 Long term (current) use of oral hypoglycemic drugs: Secondary | ICD-10-CM | POA: Insufficient documentation

## 2019-07-31 DIAGNOSIS — N183 Chronic kidney disease, stage 3 unspecified: Secondary | ICD-10-CM | POA: Insufficient documentation

## 2019-07-31 DIAGNOSIS — F419 Anxiety disorder, unspecified: Secondary | ICD-10-CM | POA: Diagnosis present

## 2019-07-31 DIAGNOSIS — Z79899 Other long term (current) drug therapy: Secondary | ICD-10-CM | POA: Insufficient documentation

## 2019-07-31 DIAGNOSIS — J449 Chronic obstructive pulmonary disease, unspecified: Secondary | ICD-10-CM | POA: Diagnosis not present

## 2019-07-31 DIAGNOSIS — R0602 Shortness of breath: Secondary | ICD-10-CM | POA: Diagnosis not present

## 2019-07-31 DIAGNOSIS — E1122 Type 2 diabetes mellitus with diabetic chronic kidney disease: Secondary | ICD-10-CM | POA: Insufficient documentation

## 2019-07-31 DIAGNOSIS — I129 Hypertensive chronic kidney disease with stage 1 through stage 4 chronic kidney disease, or unspecified chronic kidney disease: Secondary | ICD-10-CM | POA: Insufficient documentation

## 2019-07-31 LAB — CBC WITH DIFFERENTIAL/PLATELET
Abs Immature Granulocytes: 0.03 10*3/uL (ref 0.00–0.07)
Basophils Absolute: 0 10*3/uL (ref 0.0–0.1)
Basophils Relative: 1 %
Eosinophils Absolute: 0.3 10*3/uL (ref 0.0–0.5)
Eosinophils Relative: 5 %
HCT: 38.9 % (ref 36.0–46.0)
Hemoglobin: 11.9 g/dL — ABNORMAL LOW (ref 12.0–15.0)
Immature Granulocytes: 1 %
Lymphocytes Relative: 32 %
Lymphs Abs: 1.8 10*3/uL (ref 0.7–4.0)
MCH: 26.5 pg (ref 26.0–34.0)
MCHC: 30.6 g/dL (ref 30.0–36.0)
MCV: 86.6 fL (ref 80.0–100.0)
Monocytes Absolute: 0.6 10*3/uL (ref 0.1–1.0)
Monocytes Relative: 11 %
Neutro Abs: 2.9 10*3/uL (ref 1.7–7.7)
Neutrophils Relative %: 50 %
Platelets: 223 10*3/uL (ref 150–400)
RBC: 4.49 MIL/uL (ref 3.87–5.11)
RDW: 15.5 % (ref 11.5–15.5)
WBC: 5.7 10*3/uL (ref 4.0–10.5)
nRBC: 0 % (ref 0.0–0.2)

## 2019-07-31 LAB — BASIC METABOLIC PANEL
Anion gap: 11 (ref 5–15)
BUN: 20 mg/dL (ref 8–23)
CO2: 25 mmol/L (ref 22–32)
Calcium: 9.8 mg/dL (ref 8.9–10.3)
Chloride: 107 mmol/L (ref 98–111)
Creatinine, Ser: 1.44 mg/dL — ABNORMAL HIGH (ref 0.44–1.00)
GFR calc Af Amer: 45 mL/min — ABNORMAL LOW (ref >60)
GFR calc non Af Amer: 39 mL/min — ABNORMAL LOW (ref >60)
Glucose, Bld: 139 mg/dL — ABNORMAL HIGH (ref 70–99)
Potassium: 4.3 mmol/L (ref 3.5–5.1)
Sodium: 143 mmol/L (ref 135–145)

## 2019-07-31 LAB — D-DIMER, QUANTITATIVE: D-Dimer, Quant: 0.27 ug/mL-FEU (ref 0.00–0.50)

## 2019-07-31 LAB — TROPONIN I (HIGH SENSITIVITY): Troponin I (High Sensitivity): 4 ng/L (ref <18)

## 2019-07-31 MED ORDER — DILTIAZEM HCL ER COATED BEADS 180 MG PO CP24
180.0000 mg | ORAL_CAPSULE | Freq: Once | ORAL | Status: AC
  Administered 2019-07-31: 180 mg via ORAL
  Filled 2019-07-31: qty 1

## 2019-07-31 MED ORDER — CLONIDINE HCL 0.1 MG PO TABS
0.2000 mg | ORAL_TABLET | Freq: Once | ORAL | Status: AC
  Administered 2019-07-31: 0.2 mg via ORAL
  Filled 2019-07-31: qty 2

## 2019-07-31 MED ORDER — LOSARTAN POTASSIUM 50 MG PO TABS
100.0000 mg | ORAL_TABLET | Freq: Once | ORAL | Status: AC
  Administered 2019-07-31: 100 mg via ORAL
  Filled 2019-07-31: qty 2

## 2019-07-31 MED ORDER — HYDROCHLOROTHIAZIDE 12.5 MG PO CAPS
12.5000 mg | ORAL_CAPSULE | Freq: Once | ORAL | Status: AC
  Administered 2019-07-31: 12.5 mg via ORAL
  Filled 2019-07-31: qty 1

## 2019-07-31 NOTE — ED Triage Notes (Signed)
Arrived by EMS from home with c/o HTN and anxiety. Patient reports systolic BP of 99991111 at home. Patient thinks she may have had an anxiety attack this morning. Patient calm and cooperative on arrival to facility. NAD

## 2019-07-31 NOTE — ED Provider Notes (Signed)
[facility] DEPT Provider Note   CSN: [id number] Arrival date & time: 07/31/19  0522     History Chief Complaint  Patient presents with  . Shortness of Breath  . Hypertension  . Anxiety    Robin Clayton is a 64 y.o. female.  Robin Clayton is a 64 y.o. female with a history of COPD, atrial tachycardia, CKD, diabetes, sleep apnea, obesity, who presents to the emergency department via EMS, for evaluation of elevated blood pressure and possible anxiety.  She states she woke up from sleep this morning and felt short of breath, she got up used her albuterol inhaler and put on her oxygen that she has at home for use as needed.  She started to feel like her heart was beating quickly.  The symptoms seem to improve but she checked her blood pressure and her systolic was in the [id number] she became concerned and called EMS for transport.  She denies any associated chest pain.  Shortness of breath has resolved.  She denies any associated cough or fever.  No lightheadedness or syncope.  Reports no further palpitations this morning.  Reports she thought this might have been her anxiety but it felt a bit different than her usual anxiety attack.  She has not taken any of her home blood pressure medications or anxiety medicine.  No associated headache, vision changes, numbness or weakness.  No abdominal pain, no other aggravating or alleviating factors.        Past Medical History:  Diagnosis Date  . Atrial tachycardia ([place])   . Bradycardia   . Chronic bronchitis ([place])   . Chronic lower back pain   . CKD (chronic kidney disease), stage III   . Complete heart block (HCC)    a. event monitor in 10/2017 demonstrating nonsustained atrial tachycardia up to 25 beats, sinus bradycardia to sinus tachycardia (30->130bpm), transient complete heart block with ventricular standstill for up to 4 seconds.  . Constipation   . COPD (chronic obstructive pulmonary disease) ([place])   . GERD  (gastroesophageal reflux disease)    takes tums occ  . Hypertension   . Morbid obesity ([place])   . Obstructive sleep apnea    moderate OSA with AHI 24/hr intolerant to CPAP and could not afford oral device  . Type II diabetes mellitus [facility])     Patient Active Problem List   Diagnosis Date Noted  . Atrial tachycardia ([place]) 04/17/2019  . Upper airway cough syndrome 12/05/2016  . Heart palpitations 08/21/2015  . OSA (obstructive sleep apnea) 07/25/2013  . Nocturnal hypoxemia 07/10/2013  . Bradycardia 07/02/2011  . HYPERTHYROIDISM 01/28/2010  . Morbid obesity due to excess calories ([place]) c/b hbp, dm,hyperlipidemia  01/28/2010  . BACK PAIN 01/28/2010  . [place], MILD 01/03/2010  . HYPERTENSION, BENIGN ESSENTIAL 01/03/2010  . COPD GOLD II 01/03/2010    Past Surgical History:  Procedure Laterality Date  . BREAST BIOPSY  12/06/2011   Procedure: BREAST BIOPSY WITH NEEDLE LOCALIZATION;  Surgeon: Rolm Bookbinder, MD;  Location: [facility];  Service: General;  Laterality: Left;  . CESAREAN SECTION  1970     OB History   No obstetric history on file.     Family History  Problem Relation Age of Onset  . Stomach cancer Mother 28  . Hypertension Mother   . Hyperlipidemia Mother   . Diabetes Mother   . Cancer Mother        stomach  . Hypertension Father   . Diabetes Father   .  Heart attack Father   . Asthma Sister   . Colon cancer Neg Hx     Social History   Tobacco Use  . Smoking status: Former Smoker    Packs/day: 0.50    Years: 38.00    Pack years: 19.00    Types: Cigarettes    Quit date: 04/11/2019    Years since quitting: 0.3  . Smokeless tobacco: Never Used  Substance Use Topics  . Alcohol use: No  . Drug use: No    Home Medications Prior to Admission medications   Medication Sig Start Date End Date Taking? Authorizing Provider  acetaminophen (TYLENOL) 650 MG CR tablet Take 650 mg by mouth as needed for pain.   Yes [provider]  albuterol  (PROAIR HFA) 108 (90 BASE) MCG/ACT inhaler Inhale 2 puffs into the lungs every 6 (six) hours as needed for wheezing.   Yes [provider]  albuterol (PROVENTIL) (2.5 MG/3ML) 0.083% nebulizer solution Take 2.5 mg by nebulization every 4 (four) hours as needed (shortness of breath).  05/14/14  Yes [provider]  ALPRAZolam (XANAX) 0.25 MG tablet Take 0.25 mg by mouth 2 (two) times daily as needed for anxiety.  06/11/19  Yes [provider]  aspirin 81 MG tablet Take 81 mg by mouth daily.   Yes [provider]  atorvastatin (LIPITOR) 10 MG tablet Take 10 mg by mouth every evening.  09/25/12  Yes [provider]  budesonide-formoterol (SYMBICORT) 80-4.5 MCG/ACT inhaler Take 2 puffs first thing in am and then another 2 puffs about 12 hours later. 03/23/19  Yes [name], MD  cloNIDine (CATAPRES) 0.2 MG tablet Take 0.2 mg by mouth 3 (three) times daily.    Yes [provider]  diltiazem (CARDIZEM CD) 180 MG 24 hr capsule Take 1 capsule (180 mg total) by mouth daily. 04/18/19 07/31/19 Yes Imogene Burn, PA-C  famotidine (PEPCID) 20 MG tablet Take 20 mg by mouth daily.   Yes [provider]  hydrochlorothiazide (MICROZIDE) 12.5 MG capsule Take 1 capsule by mouth daily. 03/10/18  Yes [provider]  losartan (COZAAR) 100 MG tablet Take 0.5 tablets (50 mg total) by mouth daily. Patient taking differently: Take 50 mg by mouth 2 (two) times daily.  04/05/19 07/31/19 Yes Turner, Eber Hong, MD  metFORMIN (GLUCOPHAGE) 500 MG tablet Take 500 mg by mouth 2 (two) times daily. 06/16/17  Yes [provider]  montelukast (SINGULAIR) 10 MG tablet Take 10 mg by mouth daily. 03/08/19  Yes [provider]  sertraline (ZOLOFT) 25 MG tablet Take 25 mg by mouth daily.  06/11/19  Yes [provider]  etodolac (LODINE) 300 MG capsule Take 1 capsule (300 mg total) by mouth every 8 (eight) hours. Patient not taking: Reported on  07/31/2019 06/07/19   Dorie Rank, MD  lidocaine (LIDODERM) 5 % Place 1 patch onto the skin daily. Remove & Discard patch within 12 hours or as directed by MD Patient not taking: Reported on 07/31/2019 06/07/19   Dorie Rank, MD    Allergies    Wound dressings  Review of Systems   Review of Systems  Constitutional: Negative for chills and fever.  HENT: Negative.   Respiratory: Positive for shortness of breath. Negative for cough.   Cardiovascular: Negative for chest pain and leg swelling.  Gastrointestinal: Negative for abdominal pain, diarrhea, nausea and vomiting.  Musculoskeletal: Negative for arthralgias and myalgias.  Skin: Negative for color change and rash.  Neurological: Negative for dizziness,  syncope, light-headedness and headaches.  Psychiatric/Behavioral: The patient is nervous/anxious.     Physical Exam Updated Vital Signs BP (!) 156/72   Pulse (!) 55   Temp 98.5 F (36.9 C) (Oral)   Resp 17   Ht 5\' 3"  (1.6 m)   Wt 88 kg   LMP 04/30/2012   SpO2 98%   BMI 34.37 kg/m   Physical Exam Vitals and nursing note reviewed.  Constitutional:      General: She is not in acute distress.    Appearance: She is well-developed. She is obese. She is not ill-appearing or diaphoretic.     Comments: Alert, well-appearing and in no distress  HENT:     Head: Normocephalic and atraumatic.  Eyes:     General:        Right eye: No discharge.        Left eye: No discharge.     Pupils: Pupils are equal, round, and reactive to light.  Cardiovascular:     Rate and Rhythm: Normal rate and regular rhythm.     Heart sounds: Normal heart sounds. No murmur. No friction rub. No gallop.   Pulmonary:     Effort: Pulmonary effort is normal. No respiratory distress.     Breath sounds: Normal breath sounds. No wheezing or rales.     Comments: Respirations equal and unlabored, patient able to speak in full sentences, lungs clear to auscultation bilaterally Abdominal:     General: Bowel sounds are  normal. There is no distension.     Palpations: Abdomen is soft. There is no mass.     Tenderness: There is no abdominal tenderness. There is no guarding.     Comments: Abdomen soft, nondistended, nontender to palpation in all quadrants without guarding or peritoneal signs  Musculoskeletal:        General: No deformity.     Cervical back: Neck supple.     Right lower leg: No tenderness. No edema.     Left lower leg: No tenderness. No edema.  Skin:    General: Skin is warm and dry.     Capillary Refill: Capillary refill takes less than 2 seconds.  Neurological:     Mental Status: She is alert.     Coordination: Coordination normal.     Comments: Speech is clear, able to follow commands Moves extremities without ataxia, coordination intact  Psychiatric:        Mood and Affect: Mood normal.        Behavior: Behavior normal.     ED Results / Procedures / Treatments   Labs (all labs ordered are listed, but only abnormal results are displayed) Labs Reviewed  CBC WITH DIFFERENTIAL/PLATELET - Abnormal; Notable for the following components:      Result Value   Hemoglobin 11.9 (*)    All other components within normal limits  BASIC METABOLIC PANEL - Abnormal; Notable for the following components:   Glucose, Bld 139 (*)    Creatinine, Ser 1.44 (*)    GFR calc non Af Amer 39 (*)    GFR calc Af Amer 45 (*)    All other components within normal limits  D-DIMER, QUANTITATIVE (NOT AT [facility])  TROPONIN I (HIGH SENSITIVITY)    EKG None  Radiology DG Chest Port 1 View  Result Date: 07/31/2019 CLINICAL DATA:  New onset shortness of breath since yesterday. EXAM: PORTABLE CHEST 1 VIEW COMPARISON:  03/28/2019 FINDINGS: The cardiac silhouette, mediastinal and hilar contours are within normal limits. The lungs  are clear. No infiltrates, edema or effusions. The bony thorax is intact. IMPRESSION: No acute cardiopulmonary findings. Electronically Signed   By: Marijo Sanes M.D.   On: 07/31/2019  08:19    Procedures Procedures (including critical care time)  Medications Ordered in ED Medications  diltiazem (CARDIZEM CD) 24 hr capsule 180 mg (180 mg Oral Given 07/31/19 0850)  cloNIDine (CATAPRES) tablet 0.2 mg (0.2 mg Oral Given 07/31/19 0849)  hydrochlorothiazide (MICROZIDE) capsule 12.5 mg (12.5 mg Oral Given 07/31/19 0849)  losartan (COZAAR) tablet 100 mg (100 mg Oral Given 07/31/19 0850)    ED Course  I have reviewed the triage vital signs and the nursing notes.  Pertinent labs & imaging results that were available during my care of the patient were reviewed by me and considered in my medical decision making (see chart for details).    MDM Rules/Calculators/A&P                       64 year old female arrives after she woke up from sleep feeling short of breath with heart palpitations, was not sure if this was an anxiety attack or something wrong with her heart or lungs.  She states that she has a history of anxiety but this felt a bit different than her typical anxiety.  She denies associated chest pain.  Reports she no longer has palpitations or any shortness of breath.  On arrival she is mildly hypertensive but vitals otherwise normal.  Patient is well-appearing and in no distress.  Will check EKG, basic labs, given palpitations and shortness of breath as well as pain reported behind the left shoulder blade will check troponin and a D-dimer.  Lab work is reassuring with no leukocytosis, stable hemoglobin, glucose of 139 but no other acute electrolyte derangements, creatinine of 1.44 slightly bumped from baseline of 1.2, fortunately D-dimer is negative, troponin is not elevated, EKG is unremarkable and chest x-ray is clear.  9:47 AM In to speak with patient, she is very worried that she has not taken her morning blood pressure and heart medications and that her blood pressure and heart rate might start elevating her she might start having palpitations, will order patient's  diltiazem, clonidine, HCTZ and losartan for her this morning.    Patient's blood pressure has improved and she has had no further episodes of palpitations or shortness of breath here in the emergency department.  She certainly could have had a panic attack leading to her symptoms this morning, history of the same.  Could have also been paroxysmal atrial tachycardia, but patient has not had any arrhythmia noted while here on cardiac monitoring.  At this time feel patient is stable for discharge home with close follow-up with her PCP.  She expresses understanding and agreement with plan.  Discharged home in good condition.   Final Clinical Impression(s) / ED Diagnoses Final diagnoses:  Anxiety  Hypertension, unspecified type  Shortness of breath    Rx / DC Orders ED Discharge Orders    None       Janet Berlin 07/31/19 [place], [place], DO 07/31/19 1132

## 2019-07-31 NOTE — Discharge Instructions (Addendum)
Your work-up today is reassuring please continue taking all of your home medications regularly.  Your kidney function was slightly elevated today please follow-up with your primary care doctor regarding this.  Your symptoms this morning may have been related to anxiety or an elevated heart rate.  Please follow-up with your cardiologist.  Return to the ED for any new or worsening symptoms.

## 2019-09-25 ENCOUNTER — Ambulatory Visit: Payer: Medicaid Other | Admitting: Cardiology

## 2019-10-04 ENCOUNTER — Ambulatory Visit: Payer: Medicaid Other | Admitting: Cardiology

## 2019-10-04 ENCOUNTER — Other Ambulatory Visit: Payer: Self-pay

## 2019-10-04 VITALS — BP 132/64 | HR 64 | Ht 63.5 in | Wt 185.6 lb

## 2019-10-04 DIAGNOSIS — I10 Essential (primary) hypertension: Secondary | ICD-10-CM

## 2019-10-04 DIAGNOSIS — E785 Hyperlipidemia, unspecified: Secondary | ICD-10-CM

## 2019-10-04 DIAGNOSIS — G4733 Obstructive sleep apnea (adult) (pediatric): Secondary | ICD-10-CM | POA: Diagnosis not present

## 2019-10-04 DIAGNOSIS — R0609 Other forms of dyspnea: Secondary | ICD-10-CM

## 2019-10-04 DIAGNOSIS — R0600 Dyspnea, unspecified: Secondary | ICD-10-CM

## 2019-10-04 DIAGNOSIS — I471 Supraventricular tachycardia: Secondary | ICD-10-CM | POA: Diagnosis not present

## 2019-10-04 NOTE — Addendum Note (Signed)
Addended by: Antonieta Iba on: 10/04/2019 02:57 PM   Modules accepted: Orders

## 2019-10-04 NOTE — Patient Instructions (Signed)
Medication Instructions:  Your physician recommends that you continue on your current medications as directed. Please refer to the Current Medication list given to you today.  *If you need a refill on your cardiac medications before your next appointment, please call your pharmacy*   Testing/Procedures: Your physician recommends that you have a Calcium CT score.   Follow-Up: At [facility], you and your health needs are our priority.  As part of our continuing mission to provide you with exceptional heart care, we have created designated Provider Care Teams.  These Care Teams include your primary Cardiologist (physician) and Advanced Practice Providers (APPs -  Physician Assistants and Nurse Practitioners) who all work together to provide you with the care you need, when you need it.  We recommend signing up for the patient portal called "MyChart".  Sign up information is provided on this After Visit Summary.  MyChart is used to connect with patients for Virtual Visits (Telemedicine).  Patients are able to view lab/test results, encounter notes, upcoming appointments, etc.  Non-urgent messages can be sent to your provider as well.   To learn more about what you can do with MyChart, go to [email or web address].    Your next appointment:   1 year(s)  The format for your next appointment:   In Person  Provider:   You may see Fransico Him, MD or one of the following Advanced Practice Providers on your designated Care Team:    Melina Copa, PA-C  Ermalinda Barrios, PA-C

## 2019-10-04 NOTE — Progress Notes (Signed)
Date:  10/04/2019   ID:  Robin Clayton, DOB 10-May-1955, MRN [id number]   PCP:  Bernerd Limbo, MD  Cardiologist:  Fransico Him, MD  Electrophysiologist:  None   Chief Complaint:  OSA, HTN  History of Present Illness:      Robin Clayton is a 64 y.o. female with a hx of hypertension, bradycardia, morbid obesity, CKD III by COPD (followed by pulm),OSA intolerant to CPAP (on nocturnal O2), atrial tachycardia, transient complete heart block . She has history of palpitations with event monitor in 10/2017 demonstrating nonsustained atrial tachycardia up to 25 beats, sinus bradycardia to sinus tachycardia (30->130bpm), transient complete heart block with ventricular standstill for up to 4 seconds.  She was intolerant to CPAP and was on nocturnal O2.  She could not afford the oral device and refused the Inspire device.  She was interested in getting back on PAP so we placed her back on auto settings from 4 to [place].  Her Losartan was also increased to 100mg  daily due to poorly controlled HTN.  Her amlodipine was changed to Diltiazem due to palpitations and referred to EP.  Dr. Caryl Comes saw her and was doing better with the medication change.  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  She was also complaining of atypical CP at that [email or web address] last saw her in  Jan 2021 and she had quit smoking and her SOB had completely resolved. She retried CPAP but could not tolerate it and I referred her back to Dr. Toy Cookey per her request for oral device. She decided that she did not want to go to Dr. Toy Cookey. She is now in a [facility] program through [place] and they have ordered another sleep study.   She is here today for followup and is doing well.  She denies any chest pain or pressure, SOB, DOE, PND, orthopnea, LE edema, dizziness, palpitations or syncope. She is compliant with her meds and is tolerating meds with no SE.     Prior CV studies:   The following studies were reviewed  today:  None  Past Medical History:  Diagnosis Date  . Atrial tachycardia ([place])   . Bradycardia   . Chronic bronchitis ([place])   . Chronic lower back pain   . CKD (chronic kidney disease), stage III   . Complete heart block (HCC)    a. event monitor in 10/2017 demonstrating nonsustained atrial tachycardia up to 25 beats, sinus bradycardia to sinus tachycardia (30->130bpm), transient complete heart block with ventricular standstill for up to 4 seconds.  . Constipation   . COPD (chronic obstructive pulmonary disease) ([place])   . GERD (gastroesophageal reflux disease)    takes tums occ  . Hypertension   . Morbid obesity ([place])   . Obstructive sleep apnea    moderate OSA with AHI 24/hr intolerant to CPAP and could not afford oral device  . Type II diabetes mellitus ([place])    Past Surgical History:  Procedure Laterality Date  . BREAST BIOPSY  12/06/2011   Procedure: BREAST BIOPSY WITH NEEDLE LOCALIZATION;  Surgeon: Rolm Bookbinder, MD;  Location: [facility];  Service: General;  Laterality: Left;  . CESAREAN SECTION  1970     Current Meds  Medication Sig  . acetaminophen (TYLENOL) 650 MG CR tablet Take 650 mg by mouth as needed for pain.  Marland Kitchen albuterol (PROAIR HFA) 108 (90 BASE) MCG/ACT inhaler Inhale 2 puffs into the lungs every 6 (six) hours as needed for wheezing.  Marland Kitchen  albuterol (PROVENTIL) (2.5 MG/3ML) 0.083% nebulizer solution Take 2.5 mg by nebulization every 4 (four) hours as needed (shortness of breath).   Marland Kitchen aspirin 81 MG tablet Take 81 mg by mouth daily.  Marland Kitchen atorvastatin (LIPITOR) 10 MG tablet Take 10 mg by mouth every evening.   . budesonide-formoterol (SYMBICORT) 80-4.5 MCG/ACT inhaler Take 2 puffs first thing in am and then another 2 puffs about 12 hours later.  . chlorthalidone (HYGROTON) 25 MG tablet Take 1 tablet by mouth daily.  . cloNIDine (CATAPRES) 0.1 MG tablet Take 1 tablet by mouth 2 (two) times daily.  Marland Kitchen diltiazem (CARDIZEM CD) 240 MG 24 hr capsule Take 240 mg by mouth daily.   . famotidine (PEPCID) 20 MG tablet Take 20 mg by mouth daily.  . hydrochlorothiazide (MICROZIDE) 12.5 MG capsule Take 1 capsule by mouth daily.  . metFORMIN (GLUCOPHAGE) 500 MG tablet Take 500 mg by mouth 2 (two) times daily.  . montelukast (SINGULAIR) 10 MG tablet Take 10 mg by mouth daily.  . sertraline (ZOLOFT) 25 MG tablet Take 25 mg by mouth daily.   . valsartan (DIOVAN) 80 MG tablet Take 1 tablet by mouth at bedtime.     Allergies:   Wound dressings   Social History   Tobacco Use  . Smoking status: Former Smoker    Packs/day: 0.50    Years: 38.00    Pack years: 19.00    Types: Cigarettes    Quit date: 04/11/2019    Years since quitting: 0.4  . Smokeless tobacco: Never Used  Substance Use Topics  . Alcohol use: No  . Drug use: No     Family Hx: The patient's family history includes Asthma in her sister; Cancer in her mother; Diabetes in her father and mother; Heart attack in her father; Hyperlipidemia in her mother; Hypertension in her father and mother; Stomach cancer (age of onset: 29) in her mother. There is no history of Colon cancer.  ROS:   Please see the history of present illness.     All other systems reviewed and are negative.   Labs/Other Tests and Data Reviewed:    Recent Labs: 03/21/2019: NT-Pro BNP 27; TSH 1.650 07/31/2019: BUN 20; Creatinine, Ser 1.44; Hemoglobin 11.9; Platelets 223; Potassium 4.3; Sodium 143   Recent Lipid Panel Lab Results  Component Value Date/Time   CHOL  01/05/2010 03:55 AM    184        ATP III CLASSIFICATION:  <200     mg/dL   Desirable  200-239  mg/dL   Borderline High  >=240    mg/dL   High          TRIG 62 01/05/2010 03:55 AM   HDL 57 01/05/2010 03:55 AM   CHOLHDL 3.2 01/05/2010 03:55 AM   LDLCALC (H) 01/05/2010 03:55 AM    115        Total Cholesterol/HDL:CHD Risk Coronary Heart Disease Risk Table                     Men   Women  1/2 Average Risk   3.4   3.3  Average Risk       5.0   4.4  2 X Average Risk    9.6   7.1  3 X Average Risk  23.4   11.0        Use the calculated Patient Ratio above and the CHD Risk Table to determine the patient's CHD Risk.  ATP III CLASSIFICATION (LDL):  <100     mg/dL   Optimal  100-129  mg/dL   Near or Above                    Optimal  130-159  mg/dL   Borderline  160-189  mg/dL   High  >190     mg/dL   Very High    Wt Readings from Last 3 Encounters:  10/04/19 185 lb 9.6 oz (84.2 kg)  07/31/19 194 lb (88 kg)  06/08/19 210 lb (95.3 kg)     Objective:    Vital Signs:  BP 132/64   Pulse 64   Ht 5' 3.5" (1.613 m)   Wt 185 lb 9.6 oz (84.2 kg)   LMP 04/30/2012   SpO2 100%   BMI 32.36 kg/m    GEN: Well nourished, well developed in no acute distress HEENT: Normal NECK: No JVD; No carotid bruits LYMPHATICS: No lymphadenopathy CARDIAC:RRR, no murmurs, rubs, gallops RESPIRATORY:  Clear to auscultation without rales, wheezing or rhonchi  ABDOMEN: Soft, non-tender, non-distended MUSCULOSKELETAL:  No edema; No deformity  SKIN: Warm and dry NEUROLOGIC:  Alert and oriented x 3 PSYCHIATRIC:  Normal affect    ASSESSMENT & PLAN:    1.  OSA -she retried the PAP but could not tolerate it -she did not want the Inspire device. -she did not want an oral device -she is now in a health program through [facility] and they have ordered another sleep study  2.  HTN -BP controlled on exam today -continue Clonidine 0.1mg  BID, Diltiazem 180mg  daily, Chlorthalidone 25mg  daily and Valsartan 80mg  daily -her PCP is weaning her off Clonidine -Creatinine was 1.44 in March 2021  3.  Morbid Obesity -BMI>35 with co morbidities -she has lost 25lbs since Feb 2021 -she is in a [facility] [employer] program through [place]  4.  Nonsustained atrial tachycardia -denies any further palpitations -continue Cardizem  5.  SOB -this has completely resolved after stopping smoking  -denies any CP -she is concerned about her future cardiac risk given her obesity, HLD and DM -I have  recommended a coronary Ca score to assess if any CAD is present  6.  HLD -LDL goal < 70 due to DM -continue Lipitor 10mg  daily -Her LDL was 84 in Feb -followed by PCP  Medication Adjustments/Labs and Tests Ordered: Current medicines are reviewed at length with the patient today.  Concerns regarding medicines are outlined above.  Tests Ordered: No orders of the defined types were placed in this encounter.  Medication Changes: No orders of the defined types were placed in this encounter.   Disposition:  Follow up in 6 month(s)  Signed, Fransico Him, MD  10/04/2019 2:37 PM    [place]

## 2019-10-09 ENCOUNTER — Ambulatory Visit (INDEPENDENT_AMBULATORY_CARE_PROVIDER_SITE_OTHER)
Admission: RE | Admit: 2019-10-09 | Discharge: 2019-10-09 | Disposition: A | Discharge status: Home / Self Care | Payer: Self-pay | Source: Ambulatory Visit | Attending: Cardiology | Admitting: Cardiology

## 2019-10-09 ENCOUNTER — Other Ambulatory Visit: Payer: Self-pay

## 2019-10-09 DIAGNOSIS — E785 Hyperlipidemia, unspecified: Secondary | ICD-10-CM

## 2019-10-10 ENCOUNTER — Telehealth: Payer: Self-pay

## 2019-10-10 ENCOUNTER — Telehealth: Payer: Self-pay | Admitting: Cardiology

## 2019-10-10 DIAGNOSIS — I10 Essential (primary) hypertension: Secondary | ICD-10-CM

## 2019-10-10 DIAGNOSIS — R0609 Other forms of dyspnea: Secondary | ICD-10-CM

## 2019-10-10 NOTE — Telephone Encounter (Signed)
-----   Message from Thompson Grayer, RN sent at 10/10/2019  1:45 PM EDT -----  ----- Message ----- From: Sueanne Margarita, MD Sent: 10/10/2019   1:44 PM EDT To: [facility] Triage  Calcium score is elevated - please set up for Leane Call to rule out ischemia.  Please get a copy of last FLP from PCP

## 2019-10-10 NOTE — Telephone Encounter (Signed)
Patient returning call for CT results. 

## 2019-10-10 NOTE — Telephone Encounter (Signed)
The patient has been notified of the result and verbalized understanding.  All questions (if any) were answered. Antonieta Iba, RN 10/10/2019 4:31 PM

## 2019-10-10 NOTE — Telephone Encounter (Signed)
The patient has been notified of the result and verbalized understanding.  All questions (if any) were answered. Antonieta Iba, RN 10/10/2019 4:28 PM  Leane Call has been ordered.

## 2019-10-11 ENCOUNTER — Telehealth: Payer: Self-pay

## 2019-10-11 DIAGNOSIS — E785 Hyperlipidemia, unspecified: Secondary | ICD-10-CM

## 2019-10-11 MED ORDER — ATORVASTATIN CALCIUM 20 MG PO TABS: 20.0000 mg | ORAL_TABLET | Freq: Every day | ORAL | 3 refills | Status: DC

## 2019-10-11 NOTE — Telephone Encounter (Signed)
Follow Up  Patient is call back in to speak with [place]. Transferred call.

## 2019-10-11 NOTE — Telephone Encounter (Signed)
-----   Message from Sueanne Margarita, MD sent at 10/10/2019  4:49 PM EDT ----- LDL goal < 70 and hers was 84 in Feb 2021.  Increase Lipitor to 20mg  daily and repeat FLP and ALT in 6 weeeks

## 2019-10-11 NOTE — Telephone Encounter (Signed)
Patient aware and will increase Lipitor to 20 mg daily

## 2019-10-15 ENCOUNTER — Telehealth (HOSPITAL_COMMUNITY): Payer: Self-pay | Admitting: *Deleted

## 2019-10-15 NOTE — Telephone Encounter (Signed)
Patient given detailed instructions per Myocardial Perfusion Study Information Sheet for the test on 10/17/19. Patient notified to arrive 15 minutes early and that it is imperative to arrive on time for appointment to keep from having the test rescheduled.  If you need to cancel or reschedule your appointment, please call the office within 24 hours of your appointment. . Patient verbalized understanding. Kirstie Peri

## 2019-10-17 ENCOUNTER — Other Ambulatory Visit: Payer: Self-pay

## 2019-10-17 ENCOUNTER — Ambulatory Visit (HOSPITAL_COMMUNITY): Payer: Medicaid Other | Attending: Cardiology

## 2019-10-17 DIAGNOSIS — I10 Essential (primary) hypertension: Secondary | ICD-10-CM | POA: Insufficient documentation

## 2019-10-17 DIAGNOSIS — R0600 Dyspnea, unspecified: Secondary | ICD-10-CM | POA: Insufficient documentation

## 2019-10-17 DIAGNOSIS — R0609 Other forms of dyspnea: Secondary | ICD-10-CM

## 2019-10-17 LAB — MYOCARDIAL PERFUSION IMAGING
LV dias vol: 60 mL (ref 46–106)
LV sys vol: 22 mL
Peak HR: 99 {beats}/min
Rest HR: 60 {beats}/min
SDS: 4
SRS: 1
SSS: 5
TID: 1.02

## 2019-10-17 MED ORDER — TECHNETIUM TC 99M TETROFOSMIN IV KIT
32.2000 | PACK | Freq: Once | INTRAVENOUS | Status: AC | PRN
  Administered 2019-10-17: 32.2 via INTRAVENOUS
  Filled 2019-10-17: qty 33

## 2019-10-17 MED ORDER — TECHNETIUM TC 99M TETROFOSMIN IV KIT
10.1000 | PACK | Freq: Once | INTRAVENOUS | Status: AC | PRN
  Administered 2019-10-17: 10.1 via INTRAVENOUS
  Filled 2019-10-17: qty 11

## 2019-10-17 MED ORDER — REGADENOSON 0.4 MG/5ML IV SOLN
0.4000 mg | Freq: Once | INTRAVENOUS | Status: AC
  Administered 2019-10-17: 0.4 mg via INTRAVENOUS

## 2019-11-17 ENCOUNTER — Ambulatory Visit (HOSPITAL_COMMUNITY)
Admission: EM | Admit: 2019-11-17 | Discharge: 2019-11-17 | Disposition: A | Discharge status: Home / Self Care | Payer: Medicaid Other | Attending: Emergency Medicine | Admitting: Emergency Medicine

## 2019-11-17 ENCOUNTER — Other Ambulatory Visit: Payer: Self-pay

## 2019-11-17 ENCOUNTER — Encounter (HOSPITAL_COMMUNITY): Payer: Self-pay

## 2019-11-17 DIAGNOSIS — U071 COVID-19: Secondary | ICD-10-CM | POA: Diagnosis not present

## 2019-11-17 DIAGNOSIS — J209 Acute bronchitis, unspecified: Secondary | ICD-10-CM | POA: Diagnosis not present

## 2019-11-17 DIAGNOSIS — R058 Other specified cough: Secondary | ICD-10-CM

## 2019-11-17 DIAGNOSIS — R430 Anosmia: Secondary | ICD-10-CM

## 2019-11-17 MED ORDER — AZITHROMYCIN 250 MG PO TABS: ORAL_TABLET | ORAL | 0 refills | Status: DC

## 2019-11-17 NOTE — ED Provider Notes (Signed)
[facility]    CSN: [id number] Arrival date & time: 11/17/19  1227      History   Chief Complaint Chief Complaint  Patient presents with  . loss of taste/smell    HPI Robin Clayton is a 64 y.o. female.   HPI Pt c/o loss of smell. She states she went inside a lady's house that had a strong smell of ammonia 2 days ago and lost her smell when she left out of house and since. Pt states she can taste a little.  Pt states sinus pressurex4 days. Some mild cough productive of yellow sputum for 4 days.  No wheezing, chest pain, or shortness of breath.  No myalgias.  Energy level normal.  Not exposed anyone with Covid to her knowledge.  No new rash.  No myalgias. No focal neurologic symptoms.  No syncope or lightheadedness or vertigo.  Denies chest pain or irregular heartbeat.  Pertinent items noted in HPI and remainder of comprehensive ROS otherwise negative.    Past Medical History:  Diagnosis Date  . Atrial tachycardia ([place])   . Bradycardia   . Chronic bronchitis ([place])   . Chronic lower back pain   . CKD (chronic kidney disease), stage III   . Complete heart block (HCC)    a. event monitor in 10/2017 demonstrating nonsustained atrial tachycardia up to 25 beats, sinus bradycardia to sinus tachycardia (30->130bpm), transient complete heart block with ventricular standstill for up to 4 seconds.  . Constipation   . COPD (chronic obstructive pulmonary disease) ([place])   . GERD (gastroesophageal reflux disease)    takes tums occ  . Hypertension   . Morbid obesity ([place])   . Obstructive sleep apnea    moderate OSA with AHI 24/hr intolerant to CPAP and could not afford oral device  . Type II diabetes mellitus [facility])     Patient Active Problem List   Diagnosis Date Noted  . Atrial tachycardia ([place]) 04/17/2019  . Upper airway cough syndrome 12/05/2016  . Heart palpitations 08/21/2015  . OSA (obstructive sleep apnea) 07/25/2013  . Nocturnal hypoxemia 07/10/2013  .  Bradycardia 07/02/2011  . HYPERTHYROIDISM 01/28/2010  . Morbid obesity due to excess calories ([place]) c/b hbp, dm,hyperlipidemia  01/28/2010  . BACK PAIN 01/28/2010  . [place], MILD 01/03/2010  . HYPERTENSION, BENIGN ESSENTIAL 01/03/2010  . COPD GOLD II 01/03/2010    Past Surgical History:  Procedure Laterality Date  . BREAST BIOPSY  12/06/2011   Procedure: BREAST BIOPSY WITH NEEDLE LOCALIZATION;  Surgeon: Rolm Bookbinder, MD;  Location: [place];  Service: General;  Laterality: Left;  . CESAREAN SECTION  1970  . REVISION TOTAL HIP ARTHROPLASTY Bilateral     OB History   No obstetric history on file.      Home Medications    Prior to Admission medications   Medication Sig Start Date End Date Taking? Authorizing Provider  valsartan (DIOVAN) 160 MG tablet Take 160 mg by mouth daily.   Yes [provider]  acetaminophen (TYLENOL) 650 MG CR tablet Take 650 mg by mouth as needed for pain.    [provider]  albuterol (PROAIR HFA) 108 (90 BASE) MCG/ACT inhaler Inhale 2 puffs into the lungs every 6 (six) hours as needed for wheezing.    [provider]  albuterol (PROVENTIL) (2.5 MG/3ML) 0.083% nebulizer solution Take 2.5 mg by nebulization every 4 (four) hours as needed (shortness of breath).  05/14/14   [provider]  aspirin 81 MG tablet Take 81 mg  by mouth daily.    [provider]  atorvastatin (LIPITOR) 20 MG tablet Take 1 tablet (20 mg total) by mouth daily. 10/11/19   Sueanne Margarita, MD  azithromycin (ZITHROMAX Z-PAK) 250 MG tablet Take 2 tablets on day one, then 1 tablet daily on days 2 through 5 11/17/19   Jacqulyn Cane, MD  budesonide-formoterol [facility]) 80-4.5 MCG/ACT inhaler Take 2 puffs first thing in am and then another 2 puffs about 12 hours later. 03/23/19   [name], MD  chlorthalidone (HYGROTON) 25 MG tablet Take 1 tablet by mouth daily. 09/18/19   [provider]  cloNIDine (CATAPRES) 0.1 MG tablet Take  1 tablet by mouth 2 (two) times daily. 09/28/19   [provider]  diltiazem (CARDIZEM CD) 180 MG 24 hr capsule Take 1 capsule (180 mg total) by mouth daily. 04/18/19 07/31/19  Imogene Burn, PA-C  diltiazem (CARDIZEM CD) 240 MG 24 hr capsule Take 240 mg by mouth daily.    [provider]  famotidine (PEPCID) 20 MG tablet Take 20 mg by mouth daily.    [provider]  hydrochlorothiazide (MICROZIDE) 12.5 MG capsule Take 1 capsule by mouth daily. 03/10/18   [provider]  metFORMIN (GLUCOPHAGE) 500 MG tablet Take 500 mg by mouth 2 (two) times daily. 06/16/17   [provider]  montelukast (SINGULAIR) 10 MG tablet Take 10 mg by mouth daily. 03/08/19   [provider]  sertraline (ZOLOFT) 25 MG tablet Take 25 mg by mouth daily.  06/11/19   [provider]  valsartan (DIOVAN) 80 MG tablet Take 1 tablet by mouth at bedtime. 10/03/19   [provider]    Family History Family History  Problem Relation Age of Onset  . Stomach cancer Mother 19  . Hypertension Mother   . Hyperlipidemia Mother   . Diabetes Mother   . Cancer Mother        stomach  . Hypertension Father   . Diabetes Father   . Heart attack Father   . Asthma Sister   . Colon cancer Neg Hx     Social History Social History   Tobacco Use  . Smoking status: Former Smoker    Packs/day: 0.50    Years: 38.00    Pack years: 19.00    Types: Cigarettes    Quit date: 04/11/2019    Years since quitting: 0.6  . Smokeless tobacco: Never Used  Vaping Use  . Vaping Use: Never used  Substance Use Topics  . Alcohol use: No  . Drug use: No     Allergies   Wound dressings   Review of Systems Review of Systems   Physical Exam Triage Vital Signs ED Triage Vitals  Enc Vitals Group     BP 11/17/19 1313 (!) 143/69     Pulse Rate 11/17/19 1313 77     Resp 11/17/19 1313 18     Temp 11/17/19 1313 98.5 F (36.9 C)     Temp Source 11/17/19 1313 Oral     SpO2  11/17/19 1313 100 %     Weight 11/17/19 1314 184 lb (83.5 kg)     Height 11/17/19 1314 5' 3.5" (1.613 m)     Head Circumference --      Peak Flow --      Pain Score 11/17/19 1314 0     Pain Loc --      Pain Edu? --      Excl. in [place]? --  No data found.  Updated Vital Signs BP (!) 143/69   Pulse 77   Temp 98.5 F (36.9 C) (Oral)   Resp 18   Ht 5' 3.5" (1.613 m)   Wt 83.5 kg   LMP 04/30/2012   SpO2 100%   BMI 32.08 kg/m    Physical Exam Vitals and nursing note reviewed.  Constitutional:      General: She is not in acute distress.    Appearance: She is well-developed.  HENT:     Head: Normocephalic and atraumatic.     Right Ear: Tympanic membrane normal.     Left Ear: Tympanic membrane normal.     Nose: Nose normal.     Comments: Nose is normal except minimal sinus congestion.  No maxillary sinus tenderness.    Mouth/Throat:     Pharynx: No oropharyngeal exudate.  Eyes:     General: No scleral icterus.       Right eye: No discharge.        Left eye: No discharge.  Cardiovascular:     Rate and Rhythm: Normal rate and regular rhythm.     Heart sounds: Normal heart sounds.  Pulmonary:     Effort: No respiratory distress.     Breath sounds: Rhonchi present. No wheezing or rales.     Comments: Occasional nonproductive cough noted. Lungs with scattered anterior rhonchi.  Breath sounds equal bilaterally.  No wheezes or rales. Musculoskeletal:     Cervical back: Neck supple.  Lymphadenopathy:     Cervical: No cervical adenopathy.  Skin:    General: Skin is warm and dry.  Neurological:     Mental Status: She is alert and oriented to person, place, and time.     UC Treatments / Results  Labs (all labs ordered are listed, but only abnormal results are displayed) Labs Reviewed  SARS CORONAVIRUS 2 (TAT 6-24 HRS) - Abnormal; Notable for the following components:      Result Value   SARS Coronavirus 2 POSITIVE (*)    All other components within normal limits     EKG   Radiology No results found.  Procedures Procedures (including critical care time)  Medications Ordered in UC Medications - No data to display  Initial Impression / Assessment and Plan / UC Course  I have reviewed the triage vital signs and the nursing notes.  Pertinent labs & imaging results that were available during my care of the patient were reviewed by me and considered in my medical decision making (see chart for details).      Final Clinical Impressions(s) / UC Diagnoses   Final diagnoses:  Acute bronchitis, unspecified organism  Cough productive of yellow sputum  Loss of sense of smell     Discharge Instructions     Based on history and physical exam, you have acute bronchitis which could be caused by bacteria, so I am prescribing Zithromax Z-PAK, sent to your pharmacy. Also, your sudden loss of smell could be from inhaling ammonia product many days ago, but it still possible this could be Covid so were sending off a Covid test.  In the meantime, you need to quarantine until you get a negative Covid test report. If any severely worsening symptoms, you need to go to emergency room.  Otherwise, follow-up with your PCP within 1 week.      ED Prescriptions    Medication Sig Dispense Auth. Provider   azithromycin (ZITHROMAX Z-PAK) 250 MG tablet Take 2 tablets on day one, then  1 tablet daily on days 2 through 5 1 each Jacqulyn Cane, MD     (Dictation done pending Covid test results)   Jacqulyn Cane, MD 11/19/19 [phone]

## 2019-11-17 NOTE — ED Triage Notes (Signed)
Pt c/o loss of smell. She states she went inside a lady's house that had a strong smell of ammonia 2 days ago and lost her smell when she left out of house and since. Pt states she can taste a little. Pt states sinus pressurex4 days.

## 2019-11-17 NOTE — Discharge Instructions (Signed)
Based on history and physical exam, you have acute bronchitis which could be caused by bacteria, so I am prescribing Zithromax Z-PAK, sent to your pharmacy. Also, your sudden loss of smell could be from inhaling ammonia product many days ago, but it still possible this could be Covid so were sending off a Covid test.  In the meantime, you need to quarantine until you get a negative Covid test report. If any severely worsening symptoms, you need to go to emergency room.  Otherwise, follow-up with your PCP within 1 week.

## 2019-11-18 ENCOUNTER — Telehealth (HOSPITAL_COMMUNITY): Payer: Self-pay | Admitting: Emergency Medicine

## 2019-11-18 LAB — SARS CORONAVIRUS 2 (TAT 6-24 HRS): SARS Coronavirus 2: POSITIVE — AB

## 2019-11-18 NOTE — Telephone Encounter (Signed)
Called patient, verified identity using two identifiers, and provided positive result.  Reviewed quarantine recommendations, ER precautions, and how to protect others around her.  All questions answered, patient verbalized understanding.

## 2019-11-19 ENCOUNTER — Other Ambulatory Visit: Payer: Self-pay | Admitting: Nurse Practitioner

## 2019-11-19 DIAGNOSIS — U071 COVID-19: Secondary | ICD-10-CM

## 2019-11-19 DIAGNOSIS — J449 Chronic obstructive pulmonary disease, unspecified: Secondary | ICD-10-CM

## 2019-11-19 MED ORDER — SODIUM CHLORIDE 0.9 % IV SOLN
Freq: Once | INTRAVENOUS | Status: AC
  Filled 2019-11-19: qty 5

## 2019-11-19 NOTE — Progress Notes (Signed)
I connected by phone with Robin Clayton on 11/19/2019 at 9:41 AM to discuss the potential use of a new treatment for mild to moderate COVID-19 viral infection in non-hospitalized patients.  This patient is a 65 y.o. female that meets the FDA criteria for Emergency Use Authorization of COVID monoclonal antibody casirivimab/imdevimab.  Has a (+) direct SARS-CoV-2 viral test result  Has mild or moderate COVID-19   Is NOT hospitalized due to COVID-19  Is within 10 days of symptom onset  Has at least one of the high risk factor(s) for progression to severe COVID-19 and/or hospitalization as defined in EUA.  Specific high risk criteria : Chronic Lung Disease   I have spoken and communicated the following to the patient or parent/caregiver regarding COVID monoclonal antibody treatment:  1. FDA has authorized the emergency use for the treatment of mild to moderate COVID-19 in adults and pediatric patients with positive results of direct SARS-CoV-2 viral testing who are 10 years of age and older weighing at least 40 kg, and who are at high risk for progressing to severe COVID-19 and/or hospitalization.  2. The significant known and potential risks and benefits of COVID monoclonal antibody, and the extent to which such potential risks and benefits are unknown.  3. Information on available alternative treatments and the risks and benefits of those alternatives, including clinical trials.  4. Patients treated with COVID monoclonal antibody should continue to self-isolate and use infection control measures (e.g., wear mask, isolate, social distance, avoid sharing personal items, clean and disinfect "high touch" surfaces, and frequent handwashing) according to CDC guidelines.   5. The patient or parent/caregiver has the option to accept or refuse COVID monoclonal antibody treatment.  After reviewing this information with the patient, The patient agreed to proceed with receiving casirivimab\imdevimab  infusion and will be provided a copy of the Fact sheet prior to receiving the infusion. [name] 11/19/2019 9:41 AM

## 2019-11-20 ENCOUNTER — Ambulatory Visit (HOSPITAL_COMMUNITY)
Admission: RE | Admit: 2019-11-20 | Discharge: 2019-11-20 | Disposition: A | Discharge status: Home / Self Care | Payer: Medicaid Other | Source: Ambulatory Visit | Attending: Pulmonary Disease | Admitting: Pulmonary Disease

## 2019-11-20 DIAGNOSIS — U071 COVID-19: Secondary | ICD-10-CM | POA: Diagnosis not present

## 2019-11-20 DIAGNOSIS — J449 Chronic obstructive pulmonary disease, unspecified: Secondary | ICD-10-CM | POA: Diagnosis not present

## 2019-11-20 MED ORDER — SODIUM CHLORIDE 0.9 % IV SOLN: INTRAVENOUS | Status: DC | PRN

## 2019-11-20 MED ORDER — EPINEPHRINE 0.3 MG/0.3ML IJ SOAJ: 0.3000 mg | Freq: Once | INTRAMUSCULAR | Status: DC | PRN

## 2019-11-20 MED ORDER — FAMOTIDINE IN NACL 20-0.9 MG/50ML-% IV SOLN: 20.0000 mg | Freq: Once | INTRAVENOUS | Status: DC | PRN

## 2019-11-20 MED ORDER — DIPHENHYDRAMINE HCL 50 MG/ML IJ SOLN: 50.0000 mg | Freq: Once | INTRAMUSCULAR | Status: DC | PRN

## 2019-11-20 MED ORDER — METHYLPREDNISOLONE SODIUM SUCC 125 MG IJ SOLR: 125.0000 mg | Freq: Once | INTRAMUSCULAR | Status: DC | PRN

## 2019-11-20 MED ORDER — ALBUTEROL SULFATE HFA 108 (90 BASE) MCG/ACT IN AERS: 2.0000 | INHALATION_SPRAY | Freq: Once | RESPIRATORY_TRACT | Status: DC | PRN

## 2019-11-20 NOTE — Discharge Instructions (Signed)

## 2019-11-20 NOTE — Progress Notes (Signed)
  Diagnosis: COVID-19  Physician:Dr Joya Gaskins  Procedure: [facility] Infusion [facility] Med: casirivimab\imdevimab infusion - Provided patient with casirivimab\imdevimab fact sheet for patients, parents and caregivers prior to infusion.  Complications: No immediate complications noted.  Discharge: Discharged home   Christella Noa Albarece 11/20/2019

## 2020-04-18 ENCOUNTER — Ambulatory Visit (INDEPENDENT_AMBULATORY_CARE_PROVIDER_SITE_OTHER): Payer: Medicaid Other | Admitting: Adult Health

## 2020-04-18 ENCOUNTER — Encounter: Payer: Self-pay | Admitting: Adult Health

## 2020-04-18 ENCOUNTER — Other Ambulatory Visit: Payer: Self-pay

## 2020-04-18 DIAGNOSIS — J449 Chronic obstructive pulmonary disease, unspecified: Secondary | ICD-10-CM

## 2020-04-18 DIAGNOSIS — J9611 Chronic respiratory failure with hypoxia: Secondary | ICD-10-CM | POA: Insufficient documentation

## 2020-04-18 MED ORDER — BUDESONIDE-FORMOTEROL FUMARATE 80-4.5 MCG/ACT IN AERO: INHALATION_SPRAY | RESPIRATORY_TRACT | 11 refills | Status: DC

## 2020-04-18 MED ORDER — MONTELUKAST SODIUM 10 MG PO TABS: 10.0000 mg | ORAL_TABLET | Freq: Every day | ORAL | 11 refills | Status: DC

## 2020-04-18 NOTE — Patient Instructions (Addendum)
Continue Symbicort 2 puffs twice daily rinse after use Continue on Singulair daily  Continue on Oxygen 2l/m  At bedtime .  Check overnight oximetry test on room air . (insurance requirement )  Activity as tolerated. Follow up with Dr. Melvyn Novas  In 3-4 months with PFTs  and As needed

## 2020-04-18 NOTE — Progress Notes (Signed)
@Patient  ID: Robin Clayton, female    DOB: November 25, 1955, 64 y.o.   MRN: [id number]  Chief Complaint  Patient presents with  . Follow-up  . COPD    Referring provider: Bernerd Limbo, MD  HPI: 64 year old female former smoker followed for COPD  TEST/EVENTS :  12/05/2012 PFTs FEV1 1.22 (58%)  65 and no better p B2,  DLCO 59 and 74%  04/18/2020 Follow up: COPD  Patient presents for a 1 year follow-up.  Patient has underlying moderate COPD.  Patient says overall breathing has been doing okay.  She denies any flare of cough or wheezing.  She remains on Symbicort twice daily.  Patient did have COVID-19 infection in July 2021.  She received the monoclonal antibody infusion.  Patient says that she recovered well.  With no increased cough or wheezing. Patient has been recently vaccinated for COVID-19 x2. Remains activity , does light yard work. , household chores. Lives alone, drives.  Rare albuterol use. Gets winded with heavy activity , going up stairs/incline  Has chronic allergies , on singulair daily . Doing well.  Patient had a cardiac CT.  This showed no suspicious pulmonary nodules or masses.  No acute thoracic abnormalities.   Allergies  Allergen Reactions  . Wound Dressings Swelling    Immunization History  Administered Date(s) Administered  . Influenza Split 02/11/2012, 02/26/2013, 01/31/2014  . Influenza Whole 03/03/2010, 02/01/2012  . Influenza, Seasonal, Injecte, Preservative Fre 04/02/2014, 03/04/2015  . Influenza,inj,Quad PF,6+ Mos 02/26/2013, 03/11/2016, 02/23/2017, 03/08/2018, 01/28/2019, 04/17/2020  . PFIZER SARS-COV-2 Vaccination 02/20/2020, 03/13/2020  . Pneumococcal Conjugate-13 06/11/2016  . Pneumococcal Polysaccharide-23 03/23/2012  . Pneumococcal-Unspecified 01/01/2010  . Tdap 10/09/2013  . Zoster Recombinat (Shingrix) 04/17/2020    Past Medical History:  Diagnosis Date  . Atrial tachycardia ([place])   . Bradycardia   . Chronic bronchitis ([place])   . Chronic  lower back pain   . CKD (chronic kidney disease), stage III ([place])   . Complete heart block (HCC)    a. event monitor in 10/2017 demonstrating nonsustained atrial tachycardia up to 25 beats, sinus bradycardia to sinus tachycardia (30->130bpm), transient complete heart block with ventricular standstill for up to 4 seconds.  . Constipation   . COPD (chronic obstructive pulmonary disease) ([place])   . GERD (gastroesophageal reflux disease)    takes tums occ  . Hypertension   . Morbid obesity ([place])   . Obstructive sleep apnea    moderate OSA with AHI 24/hr intolerant to CPAP and could not afford oral device  . Type II diabetes mellitus (H[facility])     Tobacco History: Social History   Tobacco Use  Smoking Status Former Smoker  . Packs/day: 0.50  . Years: 38.00  . Pack years: 19.00  . Types: Cigarettes  . Quit date: 04/11/2019  . Years since quitting: 1.0  Smokeless Tobacco Never Used   Counseling given: Not Answered   Outpatient Medications Prior to Visit  Medication Sig Dispense Refill  . acetaminophen (TYLENOL) 650 MG CR tablet Take 650 mg by mouth as needed for pain.    Marland Kitchen albuterol (PROVENTIL) (2.5 MG/3ML) 0.083% nebulizer solution Take 2.5 mg by nebulization every 4 (four) hours as needed (shortness of breath).   3  . albuterol (VENTOLIN HFA) 108 (90 Base) MCG/ACT inhaler Inhale 2 puffs into the lungs every 6 (six) hours as needed for wheezing.    Marland Kitchen aspirin 81 MG tablet Take 81 mg by mouth daily.    Marland Kitchen atorvastatin (LIPITOR) 20 MG tablet Take 1  tablet (20 mg total) by mouth daily. 90 tablet 3  . budesonide-formoterol (SYMBICORT) 80-4.5 MCG/ACT inhaler Take 2 puffs first thing in am and then another 2 puffs about 12 hours later. 1 Inhaler 11  . chlorthalidone (HYGROTON) 25 MG tablet Take 1 tablet by mouth daily.    . cloNIDine (CATAPRES) 0.1 MG tablet Take 1 tablet by mouth 2 (two) times daily.    Marland Kitchen diltiazem (CARDIZEM CD) 240 MG 24 hr capsule Take 240 mg by mouth daily.    . famotidine  (PEPCID) 20 MG tablet Take 20 mg by mouth daily.    . hydrochlorothiazide (MICROZIDE) 12.5 MG capsule Take 1 capsule by mouth daily.    . metFORMIN (GLUCOPHAGE) 500 MG tablet Take 500 mg by mouth 2 (two) times daily.    . montelukast (SINGULAIR) 10 MG tablet Take 10 mg by mouth daily.    . sertraline (ZOLOFT) 25 MG tablet Take 25 mg by mouth daily.     . valsartan (DIOVAN) 160 MG tablet Take 160 mg by mouth daily.    . valsartan (DIOVAN) 80 MG tablet Take 1 tablet by mouth at bedtime.    Marland Kitchen diltiazem (CARDIZEM CD) 180 MG 24 hr capsule Take 1 capsule (180 mg total) by mouth daily. 90 capsule 3  . azithromycin (ZITHROMAX Z-PAK) 250 MG tablet Take 2 tablets on day one, then 1 tablet daily on days 2 through 5 (Patient not taking: Reported on 04/18/2020) 1 each 0   No facility-administered medications prior to visit.     Review of Systems:   Constitutional:   No  weight loss, night sweats,  Fevers, chills,  +fatigue, or  lassitude.  HEENT:   No headaches,  Difficulty swallowing,  Tooth/dental problems, or  Sore throat,                No sneezing, itching, ear ache, nasal congestion, post nasal drip,   CV:  No chest pain,  Orthopnea, PND, swelling in lower extremities, anasarca, dizziness, palpitations, syncope.   GI  No heartburn, indigestion, abdominal pain, nausea, vomiting, diarrhea, change in bowel habits, loss of appetite, bloody stools.   Resp:   No excess mucus, no productive cough,  No non-productive cough,  No coughing up of blood.  No change in color of mucus.  No wheezing.  No chest wall deformity  Skin: no rash or lesions.  GU: no dysuria, change in color of urine, no urgency or frequency.  No flank pain, no hematuria   MS:  No joint pain or swelling.  No decreased range of motion.  No back pain.    Physical Exam  BP 130/66 (BP Location: Left Arm, Patient Position: Sitting, Cuff Size: Normal)   Pulse 85   Temp 97.7 F (36.5 C) (Temporal)   Ht 5' 3.5" (1.613 m)   Wt  187 lb 3.2 oz (84.9 kg)   LMP 04/30/2012   SpO2 98%   BMI 32.64 kg/m   GEN: A/Ox3; pleasant , NAD, well nourished    HEENT:  [place]/AT,  EACs-clear, TMs-wnl, NOSE-clear, THROAT-clear, no lesions, no postnasal drip or exudate noted.   NECK:  Supple w/ fair ROM; no JVD; normal carotid impulses w/o bruits; no thyromegaly or nodules palpated; no lymphadenopathy.    RESP  Clear  P & A; w/o, wheezes/ rales/ or rhonchi. no accessory muscle use, no dullness to percussion  CARD:  RRR, no m/r/g, no peripheral edema, pulses intact, no cyanosis or clubbing.  GI:   Soft &  nt; nml bowel sounds; no organomegaly or masses detected.   Musco: Warm bil, no deformities or joint swelling noted.   Neuro: alert, no focal deficits noted.    Skin: Warm, no lesions or rashes    Lab Results:  CBC    Component Value Date/Time   WBC 5.7 07/31/2019 0738   RBC 4.49 07/31/2019 0738   HGB 11.9 (L) 07/31/2019 0738   HGB 13.1 03/21/2019 1346   HCT 38.9 07/31/2019 0738   HCT 40.8 03/21/2019 1346   PLT 223 07/31/2019 0738   PLT 219 03/21/2019 1346   MCV 86.6 07/31/2019 0738   MCV 82 03/21/2019 1346   MCH 26.5 07/31/2019 0738   MCHC 30.6 07/31/2019 0738   RDW 15.5 07/31/2019 0738   RDW 14.3 03/21/2019 1346   LYMPHSABS 1.8 07/31/2019 0738   LYMPHSABS 2.3 03/21/2019 1346   MONOABS 0.6 07/31/2019 0738   EOSABS 0.3 07/31/2019 0738   EOSABS 0.2 03/21/2019 1346   BASOSABS 0.0 07/31/2019 0738   BASOSABS 0.0 03/21/2019 1346    BMET    Component Value Date/Time   NA 143 07/31/2019 0738   NA 139 04/05/2019 1349   K 4.3 07/31/2019 0738   CL 107 07/31/2019 0738   CO2 25 07/31/2019 0738   GLUCOSE 139 (H) 07/31/2019 0738   BUN 20 07/31/2019 0738   BUN 14 04/05/2019 1349   CREATININE 1.44 (H) 07/31/2019 0738   CALCIUM 9.8 07/31/2019 0738   GFRNONAA 39 (L) 07/31/2019 0738   GFRAA 45 (L) 07/31/2019 0738    BNP No results found for: BNP  ProBNP    Component Value Date/Time   PROBNP 27 03/21/2019  1346   PROBNP 35.9 10/03/2012 2200    Imaging: No results found.    No flowsheet data found.  No results found for: NITRICOXIDE      Assessment & Plan:   Chronic respiratory failure with hypoxia ([place]) Patient is continue on oxygen 2 L at bedtime Patient is insurance is requiring a oxygen requalification.  Patient is being set up for an overnight oximetry test on room air.  COPD GOLD II Currently well controlled continue on current regimen     Rexene Edison, NP 04/18/2020

## 2020-04-18 NOTE — Assessment & Plan Note (Signed)
Patient is continue on oxygen 2 L at bedtime Patient is insurance is requiring a oxygen requalification.  Patient is being set up for an overnight oximetry test on room air.

## 2020-04-18 NOTE — Assessment & Plan Note (Signed)
Currently well controlled continue on current regimen 

## 2020-05-14 ENCOUNTER — Telehealth: Payer: Self-pay | Admitting: Adult Health

## 2020-05-14 NOTE — Telephone Encounter (Signed)
9.45 hr>90%, minimal desats-can discuss in detail on return OV.  Does have 10 min desats-so qualify for 02-continue 02 @ betime @2L  per Rexene Edison NP

## 2020-08-08 ENCOUNTER — Other Ambulatory Visit (HOSPITAL_COMMUNITY): Payer: Medicaid Other

## 2020-08-11 ENCOUNTER — Ambulatory Visit: Payer: Medicaid Other | Admitting: Internal Medicine

## 2020-08-11 ENCOUNTER — Ambulatory Visit: Payer: Medicaid Other | Admitting: Primary Care

## 2020-08-15 ENCOUNTER — Ambulatory Visit: Payer: Medicaid Other | Admitting: Internal Medicine

## 2020-08-29 ENCOUNTER — Other Ambulatory Visit (HOSPITAL_COMMUNITY)
Admission: RE | Admit: 2020-08-29 | Discharge: 2020-08-29 | Disposition: A | Discharge status: Home / Self Care | Payer: Medicaid Other | Source: Ambulatory Visit | Attending: Primary Care | Admitting: Primary Care

## 2020-08-29 DIAGNOSIS — Z20822 Contact with and (suspected) exposure to covid-19: Secondary | ICD-10-CM | POA: Diagnosis not present

## 2020-08-29 DIAGNOSIS — Z01812 Encounter for preprocedural laboratory examination: Secondary | ICD-10-CM | POA: Diagnosis not present

## 2020-08-29 LAB — SARS CORONAVIRUS 2 (TAT 6-24 HRS): SARS Coronavirus 2: NEGATIVE

## 2020-09-01 NOTE — Progress Notes (Signed)
@Patient  ID: Robin Clayton, female    DOB: 1955/11/30, 65 y.o.   MRN: [id number]  Chief Complaint  Patient presents with  . Follow-up    PFT performed today.  Pt states she has been doing okay since last visit. States she does have SOB with activities.    Referring provider: Bernerd Limbo, MD  HPI: 65 year old female, former smoker quit in December 2020 (19-pack-year history).  Past medical history significant for COPD Gold 2, chronic respiratory failure with hypoxia, OSA, hyperthyroidism, lipidemia, morbid obesity.  Dr. Melvyn Novas, last seen by pulmonary nurse practitioner on 04/18/2020.  Maintained on Symbicort 80, as needed albuterol HFA/nebulizer.  09/02/2020 Presents today for 70-month follow-up with PFTs. She has been getting a little more winded with activity. She is compliant with Symbicort 38mcg twice daily, never required rescue inhaler. She gained 20 lbs over the last 3-4 months and feels that her breathing was better before she gained this weight. She has hx sleep apnea, intolerant to cpap, uses oxygen at night. Denies f/c/s, chest tightness, wheezing or cough.    Pulmonary function testing:   12/05/2012 PFTs FEV1 1.22 (58%) 64 and no better p B2, DLCO 59 and 74%  09/02/2020- FVC 1.67 (67%), FEV1 1.09 (56%), ratio 65, DLCOunc 11.74 (60%) Moderate obstructive defect without BD response p am symbicort   Allergies  Allergen Reactions  . Wound Dressings Swelling    Immunization History  Administered Date(s) Administered  . Influenza Split 02/11/2012, 02/26/2013, 01/31/2014  . Influenza Whole 03/03/2010, 02/01/2012  . Influenza, Seasonal, Injecte, Preservative Fre 04/02/2014, 03/04/2015  . Influenza,inj,Quad PF,6+ Mos 02/26/2013, 03/11/2016, 02/23/2017, 03/08/2018, 01/28/2019, 04/17/2020  . PFIZER(Purple Top)SARS-COV-2 Vaccination 02/20/2020, 03/13/2020  . Pneumococcal Conjugate-13 06/11/2016  . Pneumococcal Polysaccharide-23 03/23/2012  . Pneumococcal-Unspecified 01/01/2010  . Tdap  10/09/2013  . Zoster Recombinat (Shingrix) 04/17/2020, 06/23/2020    Past Medical History:  Diagnosis Date  . Atrial tachycardia ([place])   . Bradycardia   . Chronic bronchitis ([place])   . Chronic lower back pain   . CKD (chronic kidney disease), stage III ([place])   . Complete heart block (HCC)    a. event monitor in 10/2017 demonstrating nonsustained atrial tachycardia up to 25 beats, sinus bradycardia to sinus tachycardia (30->130bpm), transient complete heart block with ventricular standstill for up to 4 seconds.  . Constipation   . COPD (chronic obstructive pulmonary disease) ([place])   . GERD (gastroesophageal reflux disease)    takes tums occ  . Hypertension   . Morbid obesity ([place])   . Obstructive sleep apnea    moderate OSA with AHI 24/hr intolerant to CPAP and could not afford oral device  . Type II diabetes mellitus (HCC)     Tobacco History: Social History   Tobacco Use  Smoking Status Former Smoker  . Packs/day: 0.50  . Years: 38.00  . Pack years: 19.00  . Types: Cigarettes  . Quit date: 07/01/2020  . Years since quitting: 0.1  Smokeless Tobacco Never Used   Counseling given: Not Answered   Outpatient Medications Prior to Visit  Medication Sig Dispense Refill  . acetaminophen (TYLENOL) 650 MG CR tablet Take 650 mg by mouth as needed for pain.    Marland Kitchen albuterol (PROVENTIL) (2.5 MG/3ML) 0.083% nebulizer solution Take 2.5 mg by nebulization every 4 (four) hours as needed (shortness of breath).   3  . albuterol (VENTOLIN HFA) 108 (90 Base) MCG/ACT inhaler Inhale 2 puffs into the lungs every 6 (six) hours as needed for wheezing.    Marland Kitchen aspirin  81 MG tablet Take 81 mg by mouth daily.    Marland Kitchen atorvastatin (LIPITOR) 20 MG tablet Take 1 tablet (20 mg total) by mouth daily. 90 tablet 3  . budesonide-formoterol (SYMBICORT) 80-4.5 MCG/ACT inhaler Take 2 puffs first thing in am and then another 2 puffs about 12 hours later. 1 each 11  . chlorthalidone (HYGROTON) 25 MG tablet Take 1 tablet  by mouth daily.    . cloNIDine (CATAPRES) 0.1 MG tablet Take 1 tablet by mouth 2 (two) times daily.    Marland Kitchen diltiazem (CARDIZEM CD) 240 MG 24 hr capsule Take 240 mg by mouth daily.    . famotidine (PEPCID) 20 MG tablet Take 20 mg by mouth daily.    . hydrochlorothiazide (MICROZIDE) 12.5 MG capsule Take 1 capsule by mouth daily.    . metFORMIN (GLUCOPHAGE) 500 MG tablet Take 500 mg by mouth 2 (two) times daily.    . montelukast (SINGULAIR) 10 MG tablet Take 1 tablet (10 mg total) by mouth daily. 30 tablet 11  . sertraline (ZOLOFT) 25 MG tablet Take 25 mg by mouth daily.     . valsartan (DIOVAN) 160 MG tablet Take 160 mg by mouth daily.    Marland Kitchen diltiazem (CARDIZEM CD) 180 MG 24 hr capsule Take 1 capsule (180 mg total) by mouth daily. 90 capsule 3  . valsartan (DIOVAN) 80 MG tablet Take 1 tablet by mouth at bedtime.     No facility-administered medications prior to visit.   Review of Systems  Review of Systems  Constitutional: Negative.   HENT: Negative.   Respiratory: Positive for shortness of breath. Negative for cough, chest tightness and wheezing.   Cardiovascular: Positive for leg swelling.   Physical Exam  BP 128/70 (BP Location: Left Arm, Patient Position: Sitting, Cuff Size: Large)   Pulse 65   Temp 97.7 F (36.5 C) (Temporal)   Ht 5\' 3"  (1.6 m)   Wt 204 lb (92.5 kg)   LMP 04/30/2012   SpO2 100% Comment: RA  BMI 36.14 kg/m  Physical Exam Constitutional:      Appearance: Normal appearance. She is not ill-appearing.  HENT:     Mouth/Throat:     Mouth: Mucous membranes are moist.     Pharynx: Oropharynx is clear.  Cardiovascular:     Rate and Rhythm: Normal rate and regular rhythm.     Comments: Trace pedal edema Pulmonary:     Effort: Pulmonary effort is normal.     Breath sounds: Normal breath sounds. No wheezing, rhonchi or rales.  Musculoskeletal:        General: Normal range of motion.  Skin:    General: Skin is warm and dry.  Neurological:     General: No focal  deficit present.     Mental Status: She is alert and oriented to person, place, and time. Mental status is at baseline.  Psychiatric:        Mood and Affect: Mood normal.        Behavior: Behavior normal.        Thought Content: Thought content normal.        Judgment: Judgment normal.      Lab Results:  CBC    Component Value Date/Time   WBC 5.7 07/31/2019 0738   RBC 4.49 07/31/2019 0738   HGB 11.9 (L) 07/31/2019 0738   HGB 13.1 03/21/2019 1346   HCT 38.9 07/31/2019 0738   HCT 40.8 03/21/2019 1346   PLT 223 07/31/2019 0738   PLT 219 03/21/2019  1346   MCV 86.6 07/31/2019 0738   MCV 82 03/21/2019 1346   MCH 26.5 07/31/2019 0738   MCHC 30.6 07/31/2019 0738   RDW 15.5 07/31/2019 0738   RDW 14.3 03/21/2019 1346   LYMPHSABS 1.8 07/31/2019 0738   LYMPHSABS 2.3 03/21/2019 1346   MONOABS 0.6 07/31/2019 0738   EOSABS 0.3 07/31/2019 0738   EOSABS 0.2 03/21/2019 1346   BASOSABS 0.0 07/31/2019 0738   BASOSABS 0.0 03/21/2019 1346    BMET    Component Value Date/Time   NA 143 07/31/2019 0738   NA 139 04/05/2019 1349   K 4.3 07/31/2019 0738   CL 107 07/31/2019 0738   CO2 25 07/31/2019 0738   GLUCOSE 139 (H) 07/31/2019 0738   BUN 20 07/31/2019 0738   BUN 14 04/05/2019 1349   CREATININE 1.44 (H) 07/31/2019 0738   CALCIUM 9.8 07/31/2019 0738   GFRNONAA 39 (L) 07/31/2019 0738   GFRAA 45 (L) 07/31/2019 0738    BNP No results found for: BNP  ProBNP    Component Value Date/Time   PROBNP 27 03/21/2019 1346   PROBNP 35.9 10/03/2012 2200    Imaging: No results found.   Assessment & Plan:   COPD GOLD II - Increased dyspnea with 20 lb weight gain. No active symptoms of cough, chest tightness or wheezing. PFTs remain relatively stable compared to 2014. No SABA use.  - Continue Symbicort 40mcg two puffs twice daily; albuterol 2 puffs every 6 hours for breakthrough shortness of breath/wheezing - FU in 6 months with Dr. Melvyn Novas or sooner if needed  Nocturnal hypoxemia -  Wears 2L oxygen at night as needed - Needs ONO to re-qualify for nocturnal oxygen   OSA (obstructive sleep apnea) - Intolerant to CPAP - Encourage side sleeping position and weight loss    Martyn Ehrich, NP 09/02/2020

## 2020-09-02 ENCOUNTER — Encounter: Payer: Self-pay | Admitting: Primary Care

## 2020-09-02 ENCOUNTER — Ambulatory Visit (INDEPENDENT_AMBULATORY_CARE_PROVIDER_SITE_OTHER): Payer: Medicaid Other | Admitting: Primary Care

## 2020-09-02 ENCOUNTER — Ambulatory Visit (INDEPENDENT_AMBULATORY_CARE_PROVIDER_SITE_OTHER): Payer: Medicaid Other | Admitting: Internal Medicine

## 2020-09-02 ENCOUNTER — Other Ambulatory Visit: Payer: Self-pay

## 2020-09-02 VITALS — BP 128/70 | HR 65 | Temp 97.7°F | Ht 63.0 in | Wt 204.0 lb

## 2020-09-02 DIAGNOSIS — J449 Chronic obstructive pulmonary disease, unspecified: Secondary | ICD-10-CM | POA: Diagnosis not present

## 2020-09-02 DIAGNOSIS — G4734 Idiopathic sleep related nonobstructive alveolar hypoventilation: Secondary | ICD-10-CM

## 2020-09-02 DIAGNOSIS — J9611 Chronic respiratory failure with hypoxia: Secondary | ICD-10-CM | POA: Diagnosis not present

## 2020-09-02 DIAGNOSIS — G4733 Obstructive sleep apnea (adult) (pediatric): Secondary | ICD-10-CM | POA: Diagnosis not present

## 2020-09-02 LAB — PULMONARY FUNCTION TEST
DL/VA % pred: 82 %
DL/VA: 3.49 ml/min/mmHg/L
DLCO cor % pred: 60 %
DLCO cor: 11.74 ml/min/mmHg
DLCO unc % pred: 60 %
DLCO unc: 11.74 ml/min/mmHg
FEF 25-75 Post: 0.58 L/sec
FEF 25-75 Pre: 0.55 L/sec
FEF2575-%Change-Post: 5 %
FEF2575-%Pred-Post: 30 %
FEF2575-%Pred-Pre: 29 %
FEV1-%Change-Post: 2 %
FEV1-%Pred-Post: 56 %
FEV1-%Pred-Pre: 55 %
FEV1-Post: 1.09 L
FEV1-Pre: 1.06 L
FEV1FVC-%Change-Post: 6 %
FEV1FVC-%Pred-Pre: 77 %
FEV6-%Change-Post: -4 %
FEV6-%Pred-Post: 70 %
FEV6-%Pred-Pre: 73 %
FEV6-Post: 1.67 L
FEV6-Pre: 1.74 L
FEV6FVC-%Pred-Post: 103 %
FEV6FVC-%Pred-Pre: 103 %
FVC-%Change-Post: -3 %
FVC-%Pred-Post: 67 %
FVC-%Pred-Pre: 70 %
FVC-Post: 1.67 L
FVC-Pre: 1.74 L
Post FEV1/FVC ratio: 65 %
Post FEV6/FVC ratio: 100 %
Pre FEV1/FVC ratio: 61 %
Pre FEV6/FVC Ratio: 100 %
RV % pred: 164 %
RV: 3.29 L
TLC % pred: 105 %
TLC: 5.16 L

## 2020-09-02 NOTE — Assessment & Plan Note (Signed)
-   Wears 2L oxygen at night as needed - Needs ONO to re-qualify for nocturnal oxygen

## 2020-09-02 NOTE — Patient Instructions (Addendum)
Nice meeting you today Ms Heffern PFTs today showed moderate COPD, lung function is overall stable-slightly decreased compared to 2014  Continue to stay active and work on weight loss 20-30 lbs   Recommendations: Continue Symbicort 80 two puffs morning and evening  Use albuterol 2 puffs every 6 hours as needed for breakthrough shortness of breath/wheezing   Orders: ONO re: chronic respiratory failure   Follow-up: 6 months with Dr. Melvyn Novas

## 2020-09-02 NOTE — Progress Notes (Signed)
Full PFT performed today. °

## 2020-09-02 NOTE — Patient Instructions (Signed)
Full PFT performed today. °

## 2020-09-02 NOTE — Assessment & Plan Note (Signed)
-   Intolerant to CPAP - Encourage side sleeping position and weight loss

## 2020-09-02 NOTE — Assessment & Plan Note (Signed)
-   Increased dyspnea with 20 lb weight gain. No active symptoms of cough, chest tightness or wheezing. PFTs remain relatively stable compared to 2014. No SABA use.  - Continue Symbicort 39mcg two puffs twice daily; albuterol 2 puffs every 6 hours for breakthrough shortness of breath/wheezing - FU in 6 months with Dr. Melvyn Novas or sooner if needed

## 2020-09-30 ENCOUNTER — Ambulatory Visit: Payer: Medicaid Other | Admitting: Cardiology

## 2020-09-30 ENCOUNTER — Encounter: Payer: Self-pay | Admitting: Cardiology

## 2020-09-30 ENCOUNTER — Other Ambulatory Visit: Payer: Self-pay

## 2020-09-30 VITALS — BP 114/58 | HR 64 | Ht 64.0 in | Wt 202.4 lb

## 2020-09-30 DIAGNOSIS — I471 Supraventricular tachycardia: Secondary | ICD-10-CM | POA: Diagnosis not present

## 2020-09-30 DIAGNOSIS — R931 Abnormal findings on diagnostic imaging of heart and coronary circulation: Secondary | ICD-10-CM

## 2020-09-30 DIAGNOSIS — G4733 Obstructive sleep apnea (adult) (pediatric): Secondary | ICD-10-CM

## 2020-09-30 DIAGNOSIS — I10 Essential (primary) hypertension: Secondary | ICD-10-CM | POA: Diagnosis not present

## 2020-09-30 DIAGNOSIS — E7800 Pure hypercholesterolemia, unspecified: Secondary | ICD-10-CM

## 2020-09-30 MED ORDER — ATORVASTATIN CALCIUM 40 MG PO TABS: 40.0000 mg | ORAL_TABLET | Freq: Every day | ORAL | 3 refills | Status: DC

## 2020-09-30 NOTE — Patient Instructions (Addendum)
Medication Instructions:  Your physician has recommended you make the following change in your medication:  1) INCREASE Lipitor (aotrvastatin) to 40 mg daily *If you need a refill on your cardiac medications before your next appointment, please call your pharmacy*   Lab Work: Fasting lipids and ALT in 6 weeks on 07/15 between 7:30am and 4:30pm If you have labs (blood work) drawn today and your tests are completely normal, you will receive your results only by: Marland Kitchen MyChart Message (if you have MyChart) OR . A paper copy in the mail If you have any lab test that is abnormal or we need to change your treatment, we will call you to review the results.  Follow-Up: At [facility], you and your health needs are our priority.  As part of our continuing mission to provide you with exceptional heart care, we have created designated Provider Care Teams.  These Care Teams include your primary Cardiologist (physician) and Advanced Practice Providers (APPs -  Physician Assistants and Nurse Practitioners) who all work together to provide you with the care you need, when you need it.  Your next appointment:   1 year(s)  The format for your next appointment:   In Person  Provider:   You may see Fransico Him, MD or one of the following Advanced Practice Providers on your designated Care Team:    Melina Copa, PA-C  Ermalinda Barrios, PA-C

## 2020-09-30 NOTE — Progress Notes (Signed)
Date:  09/30/2020   ID:  Robin Clayton, DOB 1956-03-18, MRN [id number]   PCP:  Bernerd Limbo, MD  Cardiologist:  Fransico Him, MD  Electrophysiologist:  None   Chief Complaint:  OSA, HTN  History of Present Illness:      Robin Clayton is a 65 y.o. female with a hx of hypertension, bradycardia, morbid obesity, CKD III by COPD (followed by pulm),OSA intolerant to CPAP (on nocturnal O2), atrial tachycardia, transient complete heart block . She has history of palpitations with event monitor in 10/2017 demonstrating nonsustained atrial tachycardia up to 25 beats, sinus bradycardia to sinus tachycardia (30->130bpm), transient complete heart block with ventricular standstill for up to 4 seconds.  She was intolerant to CPAP and was on nocturnal O2.  She could not afford the oral device and refused the Inspire device.  She was interested in getting back on PAP so we placed her back on auto settings from 4 to [place].  Her Losartan was also increased to 100mg  daily due to poorly controlled HTN.  Her amlodipine was changed to Diltiazem due to palpitations and referred to EP.  Dr. Caryl Comes saw her and was doing better with the medication change.  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  She was also complaining of atypical CP at that [place].I last saw her in  Jan 2021 and she had quit smoking and her SOB had completely resolved. She retried CPAP but could not tolerate it and I referred her back to Dr. Toy Cookey per her request for oral device. She decided that she did not want to go to Dr. Toy Cookey.   She is here today for followup and is doing well.  She has chronic DOE from obesity but this is stable and unchanged from prior.  She denies any chest pain or pressure,  PND, orthopnea, dizziness, palpitations or syncope. She has minimal LE edema from time to time. She is compliant with her meds and is tolerating meds with no SE.    Prior CV studies:   The following studies were  reviewed today:  EKG  Past Medical History:  Diagnosis Date  . Atrial tachycardia ([place])   . Bradycardia   . Chronic bronchitis ([place])   . Chronic lower back pain   . CKD (chronic kidney disease), stage III ([place])   . Complete heart block (HCC)    a. event monitor in 10/2017 demonstrating nonsustained atrial tachycardia up to 25 beats, sinus bradycardia to sinus tachycardia (30->130bpm), transient complete heart block with ventricular standstill for up to 4 seconds.  . Constipation   . COPD (chronic obstructive pulmonary disease) ([place])   . GERD (gastroesophageal reflux disease)    takes tums occ  . Hypertension   . Morbid obesity ([place])   . Obstructive sleep apnea    moderate OSA with AHI 24/hr intolerant to CPAP and could not afford oral device  . Type II diabetes mellitus ([place])    Past Surgical History:  Procedure Laterality Date  . BREAST BIOPSY  12/06/2011   Procedure: BREAST BIOPSY WITH NEEDLE LOCALIZATION;  Surgeon: Rolm Bookbinder, MD;  Location: [facility];  Service: General;  Laterality: Left;  . CESAREAN SECTION  1970  . REVISION TOTAL HIP ARTHROPLASTY Bilateral      Current Meds  Medication Sig  . acetaminophen (TYLENOL) 650 MG CR tablet Take 650 mg by mouth as needed for pain.  Marland Kitchen albuterol (PROVENTIL) (2.5 MG/3ML) 0.083% nebulizer solution Take 2.5 mg by  nebulization every 4 (four) hours as needed (shortness of breath).   Marland Kitchen albuterol (VENTOLIN HFA) 108 (90 Base) MCG/ACT inhaler Inhale 2 puffs into the lungs every 6 (six) hours as needed for wheezing.  Marland Kitchen aspirin 81 MG tablet Take 81 mg by mouth daily.  Marland Kitchen atorvastatin (LIPITOR) 20 MG tablet Take 1 tablet (20 mg total) by mouth daily.  . budesonide-formoterol (SYMBICORT) 80-4.5 MCG/ACT inhaler Take 2 puffs first thing in am and then another 2 puffs about 12 hours later.  . chlorthalidone (HYGROTON) 25 MG tablet Take 1 tablet by mouth daily.  . cloNIDine (CATAPRES) 0.1 MG tablet Take 1 tablet by mouth 2 (two) times daily.  Marland Kitchen  diltiazem (CARDIZEM CD) 240 MG 24 hr capsule Take 240 mg by mouth daily.  . famotidine (PEPCID) 20 MG tablet Take 20 mg by mouth daily.  . hydrochlorothiazide (MICROZIDE) 12.5 MG capsule Take 1 capsule by mouth daily.  . metFORMIN (GLUCOPHAGE) 500 MG tablet Take 500 mg by mouth 2 (two) times daily.  . montelukast (SINGULAIR) 10 MG tablet Take 1 tablet (10 mg total) by mouth daily.  . sertraline (ZOLOFT) 25 MG tablet Take 25 mg by mouth daily.   . valsartan (DIOVAN) 160 MG tablet Take 160 mg by mouth daily.     Allergies:   Wound dressings   Social History   Tobacco Use  . Smoking status: Former Smoker    Packs/day: 0.50    Years: 38.00    Pack years: 19.00    Types: Cigarettes    Quit date: 07/01/2020    Years since quitting: 0.2  . Smokeless tobacco: Never Used  Vaping Use  . Vaping Use: Never used  Substance Use Topics  . Alcohol use: No  . Drug use: No     Family Hx: The patient's family history includes Asthma in her sister; Cancer in her mother; Diabetes in her father and mother; Heart attack in her father; Hyperlipidemia in her mother; Hypertension in her father and mother; Stomach cancer (age of onset: 63) in her mother. There is no history of Colon cancer.  ROS:   Please see the history of present illness.     All other systems reviewed and are negative.   Labs/Other Tests and Data Reviewed:    Recent Labs: No results found for requested labs within last 8760 hours.   Recent Lipid Panel Lab Results  Component Value Date/Time   CHOL  01/05/2010 03:55 AM    184        ATP III CLASSIFICATION:  <200     mg/dL   Desirable  200-239  mg/dL   Borderline High  >=240    mg/dL   High          TRIG 62 01/05/2010 03:55 AM   HDL 57 01/05/2010 03:55 AM   CHOLHDL 3.2 01/05/2010 03:55 AM   LDLCALC (H) 01/05/2010 03:55 AM    115        Total Cholesterol/HDL:CHD Risk Coronary Heart Disease Risk Table                     Men   Women  1/2 Average Risk   3.4   3.3   Average Risk       5.0   4.4  2 X Average Risk   9.6   7.1  3 X Average Risk  23.4   11.0        Use the calculated Patient Ratio above and the  CHD Risk Table to determine the patient's CHD Risk.        ATP III CLASSIFICATION (LDL):  <100     mg/dL   Optimal  100-129  mg/dL   Near or Above                    Optimal  130-159  mg/dL   Borderline  160-189  mg/dL   High  >190     mg/dL   Very High    Wt Readings from Last 3 Encounters:  09/30/20 202 lb 6.4 oz (91.8 kg)  09/02/20 204 lb (92.5 kg)  04/18/20 187 lb 3.2 oz (84.9 kg)     Objective:    Vital Signs:  BP (!) 114/58   Pulse 64   Ht 5\' 4"  (1.626 m)   Wt 202 lb 6.4 oz (91.8 kg)   LMP 04/30/2012   SpO2 99%   BMI 34.74 kg/m    GEN: Well nourished, well developed in no acute distress HEENT: Normal NECK: No JVD; No carotid bruits LYMPHATICS: No lymphadenopathy CARDIAC:RRR, no murmurs, rubs, gallops RESPIRATORY:  Clear to auscultation without rales, wheezing or rhonchi  ABDOMEN: Soft, non-tender, non-distended MUSCULOSKELETAL:  No edema; No deformity  SKIN: Warm and dry NEUROLOGIC:  Alert and oriented x 3 PSYCHIATRIC:  Normal affect   EKG was performed in our office today and showed NSR with no ST changes  ASSESSMENT & PLAN:    1.  OSA -she retried the PAP but could not tolerate it -she did not want the Inspire device. -she did not want an oral device -she is now in a health program through [facility] and had lost significant weight and her PCP told her her sleep study showed she did not need CPAP  2.  HTN -BP is adequately controlled on exam today -Continue prescription drug management with Clonidine 0.1mg  BID, Cardizem CD 240mg  daily, HCTZ 12.5mg  daily and Valsartan 160mg  daily  -I have personally reviewed and interpreted outside labs performed by patient's PCP which showed SCR 1.49 (up from 1.41 last summer), K+ 4.6   3.  Morbid Obesity -BMI>35 with co morbidities -she has gained 17lbs since I saw her  last -I have encouraged her to get into a routine exercise program and cut back on carbs and portions.   4.  Nonsustained atrial tachycardia -she is maintaining NSR on exam and denies any palpitations -continue Cardizem  5. Coronary artery calcifications -coronary Ca score done for SOB -she has not had any further SOB or chest pain -coronary Ca score was 484 -continue statin  6.  HLD -LDL goal < 70 due to DM and coronary Ca -I have personally reviewed and interpreted outside labs performed by patient's PCP which showed LDL 100, HDL 50 and TAG 103 and ALT 11 on 09/18/2020 -increase Lipitor to 40mg  daily -check FLP and ALT in 6 weeks   Medication Adjustments/Labs and Tests Ordered: Current medicines are reviewed at length with the patient today.  Concerns regarding medicines are outlined above.  Tests Ordered: Orders Placed This Encounter  Procedures  . EKG 12-Lead   Medication Changes: No orders of the defined types were placed in this encounter.   Disposition:  Follow up in 6 month(s)  Signed, Fransico Him, MD  09/30/2020 2:45 PM    [facility]

## 2020-09-30 NOTE — Addendum Note (Signed)
Addended by: Antonieta Iba on: 09/30/2020 02:53 PM   Modules accepted: Orders

## 2020-10-01 ENCOUNTER — Telehealth: Payer: Self-pay | Admitting: *Deleted

## 2020-10-01 NOTE — Telephone Encounter (Signed)
ONO results per Tammy Parrett: (+) desats with sleep.  Continue on O2 2L/HS.  Called and spoke with patient, advised of results and recommendations per Rexene Edison NP.  She verbalized understanding.  Nothing further needed.

## 2020-10-07 ENCOUNTER — Encounter: Payer: Self-pay | Admitting: Gastroenterology

## 2020-11-06 ENCOUNTER — Ambulatory Visit (INDEPENDENT_AMBULATORY_CARE_PROVIDER_SITE_OTHER): Payer: Medicaid Other | Admitting: Gastroenterology

## 2020-11-06 ENCOUNTER — Encounter: Payer: Self-pay | Admitting: Gastroenterology

## 2020-11-06 VITALS — BP 132/80 | HR 77 | Ht 64.0 in | Wt 204.8 lb

## 2020-11-06 DIAGNOSIS — K5900 Constipation, unspecified: Secondary | ICD-10-CM | POA: Diagnosis not present

## 2020-11-06 DIAGNOSIS — D509 Iron deficiency anemia, unspecified: Secondary | ICD-10-CM | POA: Diagnosis not present

## 2020-11-06 DIAGNOSIS — K219 Gastro-esophageal reflux disease without esophagitis: Secondary | ICD-10-CM | POA: Diagnosis not present

## 2020-11-06 MED ORDER — NA SULFATE-K SULFATE-MG SULF 17.5-3.13-1.6 GM/177ML PO SOLN: 1.0000 | Freq: Once | ORAL | 0 refills | Status: AC

## 2020-11-06 NOTE — Progress Notes (Signed)
11/06/2020 Robin Clayton [id number] October 12, 1955   HISTORY OF PRESENT ILLNESS: This is a 65 year old female who is known to our practice only for colonoscopy with Dr. Deatra Ina in February 2013 at which time her study was normal.  She has been referred here by her PCP, Dr. Coletta Memos, for evaluation regarding anemia.  Her hemoglobin 2 years ago was 13.1 g.  In March 2021 it was down to 11.9 g, and recently was down to 9.9 g.  MCV is low at 76.6.  Iron saturation was low at 12%, but serum iron is normal at 47 and TIBC is normal at 403.  She denies any sign of GI bleeding.  Denies any black or bloody stools.  Her stools were not checked for occult blood.  She tells me that she does have constipation and that has worsened over the last several months.  She is currently only using prune juice intermittently to deal with that.  She reports that she takes famotidine 20 mg daily, which completely controls any heartburn or reflux issues that she has had.  She has past medical history of chronic kidney disease stage III, obstructive sleep apnea/COPD, hypertension, diabetes mellitus type 2.  Past Medical History:  Diagnosis Date   Anxiety    Atrial tachycardia (HCC)    Bradycardia    Chronic bronchitis (HCC)    Chronic lower back pain    CKD (chronic kidney disease), stage III ([place])    Complete heart block ([place])    a. event monitor in 10/2017 demonstrating nonsustained atrial tachycardia up to 25 beats, sinus bradycardia to sinus tachycardia (30->130bpm), transient complete heart block with ventricular standstill for up to 4 seconds.   Constipation    COPD (chronic obstructive pulmonary disease) (HCC)    GERD (gastroesophageal reflux disease)    takes tums occ   Hypertension    Morbid obesity ([place])    Obstructive sleep apnea    moderate OSA with AHI 24/hr intolerant to CPAP and could not afford oral device   Type II diabetes mellitus ([place])    Past Surgical History:  Procedure Laterality Date   BREAST  BIOPSY  12/06/2011   Procedure: BREAST BIOPSY WITH NEEDLE LOCALIZATION;  Surgeon: Rolm Bookbinder, MD;  Location: [facility];  Service: General;  Laterality: Left;   [place] Bilateral     reports that she quit smoking about 4 months ago. Her smoking use included cigarettes. She has a 19.00 pack-year smoking history. She has never used smokeless tobacco. She reports that she does not drink alcohol and does not use drugs. family history includes Asthma in her sister; Cancer in her mother; Diabetes in her father and mother; Heart attack in her father; Hyperlipidemia in her mother; Hypertension in her father and mother; Kidney disease in her father and sister; Stomach cancer (age of onset: 20) in her mother. Allergies  Allergen Reactions   Wound Dressings Swelling      Outpatient Encounter Medications as of 11/06/2020  Medication Sig   acetaminophen (TYLENOL) 650 MG CR tablet Take 650 mg by mouth as needed for pain.   albuterol (PROVENTIL) (2.5 MG/3ML) 0.083% nebulizer solution Take 2.5 mg by nebulization every 4 (four) hours as needed (shortness of breath).    albuterol (VENTOLIN HFA) 108 (90 Base) MCG/ACT inhaler Inhale 2 puffs into the lungs every 6 (six) hours as needed for wheezing.   aspirin 81 MG tablet Take 81 mg by mouth daily.  atorvastatin (LIPITOR) 40 MG tablet Take 1 tablet (40 mg total) by mouth daily.   budesonide-formoterol (SYMBICORT) 80-4.5 MCG/ACT inhaler Take 2 puffs first thing in am and then another 2 puffs about 12 hours later.   chlorthalidone (HYGROTON) 25 MG tablet Take 1 tablet by mouth daily.   cloNIDine (CATAPRES) 0.1 MG tablet Take 1 tablet by mouth 2 (two) times daily.   diltiazem (CARDIZEM CD) 240 MG 24 hr capsule Take 240 mg by mouth daily.   famotidine (PEPCID) 20 MG tablet Take 20 mg by mouth daily.   hydrochlorothiazide (MICROZIDE) 12.5 MG capsule Take 1 capsule by mouth daily.   metFORMIN (GLUCOPHAGE) 500 MG tablet  Take 500 mg by mouth 2 (two) times daily.   montelukast (SINGULAIR) 10 MG tablet Take 1 tablet (10 mg total) by mouth daily.   sertraline (ZOLOFT) 25 MG tablet Take 25 mg by mouth daily.    valsartan (DIOVAN) 160 MG tablet Take 160 mg by mouth daily.   [DISCONTINUED] diltiazem (CARDIZEM CD) 180 MG 24 hr capsule Take 1 capsule (180 mg total) by mouth daily. (Patient not taking: Reported on 11/06/2020)   No facility-administered encounter medications on file as of 11/06/2020.     REVIEW OF SYSTEMS  : All other systems reviewed and negative except where noted in the History of Present Illness.   PHYSICAL EXAM: BP 132/80   Pulse 77   Ht 5\' 4"  (1.626 m)   Wt 204 lb 12.8 oz (92.9 kg)   LMP 04/30/2012   SpO2 98%   BMI 35.15 kg/m  General: Well developed AA female in no acute distress Head: Normocephalic and atraumatic Eyes:  Sclerae anicteric, conjunctiva pink. Ears: Normal auditory acuity Lungs: Clear throughout to auscultation; no W/R/R. Heart: Regular rate and rhythm; no M/R/G. Abdomen: Soft, non-distended.  BS present.  Non-tender. Rectal:  Will be done at the time of colonoscopy. Musculoskeletal: Symmetrical with no gross deformities  Skin: No lesions on visible extremities Extremities: No edema  Neurological: Alert oriented x 4, grossly non-focal Psychological:  Alert and cooperative. Normal mood and affect  ASSESSMENT AND PLAN: *Iron deficiency anemia: Hemoglobin was 13.1 g 2 years ago, 11.9 g last year, 9.9 g this year.  MCV is low at 76.6.  Iron saturation is low at 12%, but serum iron is normal at 47 and TIBC is normal at 403.  No overt sign of GI bleeding and stool has not been checked for occult blood.  She does have some renal insufficiency.  Last colonoscopy was 9.5 years ago.  Has never had an EGD.  Does have some GERD, but is well controlled on famotidine 20 mg daily.  We will plan for both EGD and colonoscopy with Dr. Tarri Glenn. *Chronic constipation: Overall does well with  prune juice intermittently, but is concerned about drinking this regularly due to how will affect her blood sugars.  Suggested MiraLAX, 1 dose mixed in 8 ounces of liquid daily.  She will try this.  Samples were given.  **The risks, benefits, and alternatives to EGD and colonoscopy were discussed with the patient and she consents to proceed.    CC:  Bernerd Limbo, MD

## 2020-11-06 NOTE — Patient Instructions (Signed)
Start Miralax 1 capful in 8 ounces of liquid daily. (Samples provided)  You have been scheduled for an endoscopy and colonoscopy. Please follow the written instructions given to you at your visit today. Please pick up your prep supplies at the pharmacy within the next 1-3 days. If you use inhalers (even only as needed), please bring them with you on the day of your procedure.  If you are age 64 or older, your body mass index should be between 23-30. Your Body mass index is 35.15 kg/m. If this is out of the aforementioned range listed, please consider follow up with your Primary Care Provider.  If you are age 65 or younger, your body mass index should be between 19-25. Your Body mass index is 35.15 kg/m. If this is out of the aformentioned range listed, please consider follow up with your Primary Care Provider.   __________________________________________________________  The [facility] providers would like to encourage you to use [facility] to communicate with providers for non-urgent requests or questions.  Due to long hold times on the telephone, sending your provider a message by [facility] may be a faster and more efficient way to get a response.  Please allow 48 business hours for a response.  Please remember that this is for non-urgent requests.

## 2020-11-07 ENCOUNTER — Encounter: Payer: Self-pay | Admitting: Gastroenterology

## 2020-11-07 NOTE — Progress Notes (Signed)
Reviewed and agree with management plans. Please arrange for two day bowel prep prior to colonoscopy given the history of constipation.   Robin Clayton L. Tarri Glenn, MD, MPH

## 2020-11-14 ENCOUNTER — Other Ambulatory Visit: Payer: Medicaid Other | Admitting: *Deleted

## 2020-11-14 ENCOUNTER — Other Ambulatory Visit: Payer: Self-pay

## 2020-11-14 DIAGNOSIS — E7800 Pure hypercholesterolemia, unspecified: Secondary | ICD-10-CM

## 2020-11-15 LAB — LIPID PANEL
Chol/HDL Ratio: 3.6 ratio (ref 0.0–4.4)
Cholesterol, Total: 173 mg/dL (ref 100–199)
HDL: 48 mg/dL (ref >39)
LDL Chol Calc (NIH): 100 mg/dL — ABNORMAL HIGH (ref 0–99)
Triglycerides: 145 mg/dL (ref 0–149)
VLDL Cholesterol Cal: 25 mg/dL (ref 5–40)

## 2020-11-15 LAB — ALT: ALT: 11 IU/L (ref 0–32)

## 2020-11-18 ENCOUNTER — Telehealth: Payer: Self-pay

## 2020-11-18 DIAGNOSIS — E7800 Pure hypercholesterolemia, unspecified: Secondary | ICD-10-CM

## 2020-11-18 MED ORDER — ATORVASTATIN CALCIUM 80 MG PO TABS: 80.0000 mg | ORAL_TABLET | Freq: Every day | ORAL | 3 refills | Status: DC

## 2020-11-18 NOTE — Telephone Encounter (Signed)
-----   Message from Sueanne Margarita, MD sent at 11/16/2020 12:09 PM EDT ----- LDL not at goal - increase atorvastatin to 80mg  daily and repeat FLP and ALT in 6 weeks

## 2020-11-18 NOTE — Telephone Encounter (Signed)
The patient has been notified of the result and verbalized understanding.  All questions (if any) were answered. Antonieta [facility], RN 11/18/2020 1:37 PM  Patient will increase atorvastatin to 80-mg daily and repeat labs in 6 weeks.

## 2020-12-01 ENCOUNTER — Ambulatory Visit (AMBULATORY_SURGERY_CENTER): Payer: Medicaid Other | Admitting: Gastroenterology

## 2020-12-01 ENCOUNTER — Encounter: Payer: Self-pay | Admitting: Gastroenterology

## 2020-12-01 ENCOUNTER — Other Ambulatory Visit: Payer: Self-pay

## 2020-12-01 ENCOUNTER — Other Ambulatory Visit (INDEPENDENT_AMBULATORY_CARE_PROVIDER_SITE_OTHER): Payer: Medicaid Other

## 2020-12-01 VITALS — BP 126/84 | HR 52 | Temp 97.8°F | Resp 13 | Ht 64.0 in | Wt 204.0 lb

## 2020-12-01 DIAGNOSIS — D509 Iron deficiency anemia, unspecified: Secondary | ICD-10-CM

## 2020-12-01 DIAGNOSIS — K5900 Constipation, unspecified: Secondary | ICD-10-CM

## 2020-12-01 DIAGNOSIS — K5730 Diverticulosis of large intestine without perforation or abscess without bleeding: Secondary | ICD-10-CM

## 2020-12-01 DIAGNOSIS — K621 Rectal polyp: Secondary | ICD-10-CM | POA: Diagnosis not present

## 2020-12-01 DIAGNOSIS — K635 Polyp of colon: Secondary | ICD-10-CM

## 2020-12-01 DIAGNOSIS — K2980 Duodenitis without bleeding: Secondary | ICD-10-CM

## 2020-12-01 DIAGNOSIS — K219 Gastro-esophageal reflux disease without esophagitis: Secondary | ICD-10-CM

## 2020-12-01 DIAGNOSIS — K319 Disease of stomach and duodenum, unspecified: Secondary | ICD-10-CM

## 2020-12-01 DIAGNOSIS — D122 Benign neoplasm of ascending colon: Secondary | ICD-10-CM

## 2020-12-01 DIAGNOSIS — D123 Benign neoplasm of transverse colon: Secondary | ICD-10-CM

## 2020-12-01 DIAGNOSIS — D128 Benign neoplasm of rectum: Secondary | ICD-10-CM

## 2020-12-01 LAB — CBC WITH DIFFERENTIAL/PLATELET
Basophils Absolute: 0.1 10*3/uL (ref 0.0–0.1)
Basophils Relative: 0.9 % (ref 0.0–3.0)
Eosinophils Absolute: 0.2 10*3/uL (ref 0.0–0.7)
Eosinophils Relative: 4.2 % (ref 0.0–5.0)
HCT: 34.2 % — ABNORMAL LOW (ref 36.0–46.0)
Hemoglobin: 10.5 g/dL — ABNORMAL LOW (ref 12.0–15.0)
Lymphocytes Relative: 39.7 % (ref 12.0–46.0)
Lymphs Abs: 2.3 10*3/uL (ref 0.7–4.0)
MCHC: 30.6 g/dL (ref 30.0–36.0)
MCV: 78.3 fl (ref 78.0–100.0)
Monocytes Absolute: 0.5 10*3/uL (ref 0.1–1.0)
Monocytes Relative: 8 % (ref 3.0–12.0)
Neutro Abs: 2.7 10*3/uL (ref 1.4–7.7)
Neutrophils Relative %: 47.2 % (ref 43.0–77.0)
Platelets: 211 10*3/uL (ref 150.0–400.0)
RBC: 4.37 Mil/uL (ref 3.87–5.11)
RDW: 17.8 % — ABNORMAL HIGH (ref 11.5–15.5)
WBC: 5.7 10*3/uL (ref 4.0–10.5)

## 2020-12-01 LAB — IBC + FERRITIN
Ferritin: 11.6 ng/mL (ref 10.0–291.0)
Iron: 69 ug/dL (ref 42–145)
Saturation Ratios: 14.5 % — ABNORMAL LOW (ref 20.0–50.0)
Transferrin: 340 mg/dL (ref 212.0–360.0)

## 2020-12-01 MED ORDER — SODIUM CHLORIDE 0.9 % IV SOLN: 500.0000 mL | Freq: Once | INTRAVENOUS | Status: DC

## 2020-12-01 NOTE — Patient Instructions (Signed)
YOU HAD AN ENDOSCOPIC PROCEDURE TODAY AT [facility]:   Refer to the procedure report that was given to you for any specific questions about what was found during the examination.  If the procedure report does not answer your questions, please call your gastroenterologist to clarify.  If you requested that your care partner not be given the details of your procedure findings, then the procedure report has been included in a sealed envelope for you to review at your convenience later.  Avoid aspirin, ibuprofen and aleve until further notice. You will have bloodwork done today after your procedure.  YOU SHOULD EXPECT: Some feelings of bloating in the abdomen. Passage of more gas than usual.  Walking can help get rid of the air that was put into your GI tract during the procedure and reduce the bloating. If you had a lower endoscopy (such as a colonoscopy or flexible sigmoidoscopy) you may notice spotting of blood in your stool or on the toilet paper. If you underwent a bowel prep for your procedure, you may not have a normal bowel movement for a few days.  Please Note:  You might notice some irritation and congestion in your nose or some drainage.  This is from the oxygen used during your procedure.  There is no need for concern and it should clear up in a day or so.  SYMPTOMS TO REPORT IMMEDIATELY:  Following lower endoscopy (colonoscopy or flexible sigmoidoscopy):  Excessive amounts of blood in the stool  Significant tenderness or worsening of abdominal pains  Swelling of the abdomen that is new, acute  Fever of 100F or higher  Following upper endoscopy (EGD)  Vomiting of blood or coffee ground material  New chest pain or pain under the shoulder blades  Painful or persistently difficult swallowing  New shortness of breath  Fever of 100F or higher  Black, tarry-looking stools  For urgent or emergent issues, a gastroenterologist can be reached at any hour by calling [phone]  [phone]. Do not use MyChart messaging for urgent concerns.    DIET:  We do recommend a small meal at first, but then you may proceed to your regular diet.  Drink plenty of fluids but you should avoid alcoholic beverages for 24 hours. Try to  increase the fiber in your diet, and drink plenty of water.  Try to avoid red meat.  ACTIVITY:  You should plan to take it easy for the rest of today and you should NOT DRIVE or use heavy machinery until tomorrow (because of the sedation medicines used during the test).    FOLLOW UP: Our staff will call the number listed on your records 48-72 hours following your procedure to check on you and address any questions or concerns that you may have regarding the information given to you following your procedure. If we do not reach you, we will leave a message.  We will attempt to reach you two times.  During this call, we will ask if you have developed any symptoms of COVID 19. If you develop any symptoms (ie: fever, flu-like symptoms, shortness of breath, cough etc.) before then, please call [phone].  If you test positive for Covid 19 in the 2 weeks post procedure, please call and report this information to [place].    If any biopsies were taken you will be contacted by phone or by letter within the next 1-3 weeks.  Please call us at [phone] if you have not heard about the biopsies in  3 weeks.    SIGNATURES/CONFIDENTIALITY: You and/or your care partner have signed paperwork which will be entered into your electronic medical record.  These signatures attest to the fact that that the information above on your After Visit Summary has been reviewed and is understood.  Full responsibility of the confidentiality of this discharge information lies with you and/or your care-partner.

## 2020-12-01 NOTE — Op Note (Signed)
[facility] Patient Name: Robin Clayton Procedure Date: 12/01/2020 1:09 PM MRN: [id number] Endoscopist: Thornton Park MD, MD Age: 65 Referring MD:  Date of Birth: October 15, 1955 Gender: Female Account #: [id number] Procedure:                Colonoscopy Indications:              Unexplained iron deficiency anemia Medicines:                Monitored Anesthesia Care Procedure:                Pre-Anesthesia Assessment:                           - Prior to the procedure, a History and Physical                            was performed, and patient medications and                            allergies were reviewed. The patient's tolerance of                            previous anesthesia was also reviewed. The risks                            and benefits of the procedure and the sedation                            options and risks were discussed with the patient.                            All questions were answered, and informed consent                            was obtained. Prior Anticoagulants: The patient has                            taken no previous anticoagulant or antiplatelet                            agents. ASA Grade Assessment: III - A patient with                            severe systemic disease. After reviewing the risks                            and benefits, the patient was deemed in                            satisfactory condition to undergo the procedure.                           After obtaining informed consent, the colonoscope  was passed under direct vision. Throughout the                            procedure, the patient's blood pressure, pulse, and                            oxygen saturations were monitored continuously. The                            CF [id number] was introduced through the anus                            and advanced to the the cecum, identified by                            appendiceal orifice and  ileocecal valve. A second                            forward view of the right colon was performed. The                            colonoscopy was performed with moderate difficulty                            due to a redundant colon, significant looping and a                            tortuous colon. Successful completion of the                            procedure was aided by applying abdominal pressure.                            The patient tolerated the procedure well. The                            quality of the bowel preparation was good. The                            ileocecal valve, appendiceal orifice, and rectum                            were photographed. Scope In: 1:45:58 PM Scope Out: 2:09:36 PM Scope Withdrawal Time: 0 hours 19 minutes 21 seconds  Total Procedure Duration: 0 hours 23 minutes 38 seconds  Findings:                 The perianal and digital rectal examinations were                            normal.                           A few small-mouthed diverticula were found in the  sigmoid colon.                           Two sessile polyps were found in the rectum. The                            polyps were 3 to 5 mm in size. These polyps were                            removed with a cold snare. Resection and retrieval                            were complete. Estimated blood loss was minimal.                           A 2 mm polyp was found in the transverse colon. The                            polyp was sessile. The polyp was removed with a                            cold snare. Resection and retrieval were complete.                            Estimated blood loss was minimal.                           A 2 mm polyp was found in the ascending colon. The                            polyp was sessile. The polyp was removed with a                            cold snare. Resection and retrieval were complete.                             Estimated blood loss was minimal.                           The exam was otherwise without abnormality on                            direct and retroflexion views. Complications:            No immediate complications. Estimated blood loss:                            Minimal. Estimated Blood Loss:     Estimated blood loss was minimal. Impression:               - Diverticulosis in the sigmoid colon.                           - Two 2 to 3 mm polyps in  the rectum, removed with                            a cold snare. Resected and retrieved.                           - One 2 mm polyp in the transverse colon, removed                            with a cold snare. Resected and retrieved.                           - One 2 mm polyp in the ascending colon, removed                            with a cold snare. Resected and retrieved.                           - The examination was otherwise normal on direct                            and retroflexion views. Recommendation:           - Patient has a contact number available for                            emergencies. The signs and symptoms of potential                            delayed complications were discussed with the                            patient. Return to normal activities tomorrow.                            Written discharge instructions were provided to the                            patient.                           - Resume previous diet.                           - Continue present medications.                           - Await pathology results.                           - Please stop by the lab today to follow-up on                            anemia studies: iron, ferritin, CBC.                           -  Repeat colonoscopy date to be determined after                            pending pathology results are reviewed for                            surveillance.                           - Emerging evidence supports eating a  diet of                            fruits, vegetables, grains, calcium, and yogurt                            while reducing red meat and alcohol may reduce the                            risk of colon cancer. Thornton Park MD, MD 12/01/2020 2:17:45 PM This report has been signed electronically.

## 2020-12-01 NOTE — Progress Notes (Signed)
Upon entering admitting observed pt. Peeking from around curtain,she stated "can I get my inhaler I am having trouble breathing",told pt. Yes that was ok pt.took on puff of inhaler and placed in bed with HOB  Elevated and placed pulse ox to monitor oxygen level,oxygen level 98% on room air heart rate 58,b/p 136/61,made CRNA aware and he  stated "I am going to give her a breathing treatment".[place] CRNA Into assess pt. And administer breathing treatment.

## 2020-12-01 NOTE — Progress Notes (Signed)
Called to room to assist during endoscopic procedure.  Patient ID and intended procedure confirmed with present staff. Received instructions for my participation in the procedure from the performing physician.  

## 2020-12-01 NOTE — Progress Notes (Signed)
1302 Robinul 0.1 mg IV given due large amount of secretions upon assessment.  Pt experiencing SOB, albuterol given without problems. MD made aware, vss

## 2020-12-01 NOTE — Progress Notes (Signed)
Report given to PACU, vss 

## 2020-12-01 NOTE — Op Note (Signed)
[facility] Patient Name: Robin Clayton Procedure Date: 12/01/2020 1:10 PM MRN: [id number] Endoscopist: Thornton Park MD, MD Age: 65 Referring MD:  Date of Birth: 22-Feb-1956 Gender: Female Account #: [id number] Procedure:                Upper GI endoscopy Indications:              Unexplained iron deficiency anemia Medicines:                Monitored Anesthesia Care Procedure:                Pre-Anesthesia Assessment:                           - Prior to the procedure, a History and Physical                            was performed, and patient medications and                            allergies were reviewed. The patient's tolerance of                            previous anesthesia was also reviewed. The risks                            and benefits of the procedure and the sedation                            options and risks were discussed with the patient.                            All questions were answered, and informed consent                            was obtained. Prior Anticoagulants: The patient has                            taken no previous anticoagulant or antiplatelet                            agents. ASA Grade Assessment: III - A patient with                            severe systemic disease. After reviewing the risks                            and benefits, the patient was deemed in                            satisfactory condition to undergo the procedure.                           After obtaining informed consent, the endoscope was  passed under direct vision. Throughout the                            procedure, the patient's blood pressure, pulse, and                            oxygen saturations were monitored continuously. The                            GIF [id number] was introduced through the                            mouth, and advanced to the third part of duodenum.                            The upper GI  endoscopy was accomplished without                            difficulty. The patient tolerated the procedure                            well. Scope In: Scope Out: Findings:                 The esophagus was normal. The z-line is located 36                            cm from the incisors.                           Patchy mildly erythematous mucosa was found in the                            gastric body. Biopsies were taken with a cold                            forceps for histology. Estimated blood loss was                            minimal.                           Diffuse mildly erythematous mucosa without active                            bleeding was found in the duodenal bulb. Biopsies                            were taken with a cold forceps for histology.                            Estimated blood loss was minimal. Complications:            No immediate complications. Estimated blood loss:  Minimal. Estimated Blood Loss:     Estimated blood loss was minimal. Impression:               - Normal esophagus.                           - Erythematous mucosa in the gastric body. Biopsied.                           - Erythematous duodenopathy. Biopsied.                           - These findings are very mild and are not expected                            to explain her iron deficiency. Recommendation:           - Patient has a contact number available for                            emergencies. The signs and symptoms of potential                            delayed complications were discussed with the                            patient. Return to normal activities tomorrow.                            Written discharge instructions were provided to the                            patient.                           - Resume previous diet.                           - Continue present medications.                           - No aspirin, ibuprofen, naproxen, or  other                            non-steroidal anti-inflammatory drugs.                           - Await pathology results.                           - Proceed with colonoscopy. Thornton Park MD, MD 12/01/2020 2:13:37 PM This report has been signed electronically.

## 2020-12-03 ENCOUNTER — Telehealth: Payer: Self-pay | Admitting: *Deleted

## 2020-12-03 NOTE — Telephone Encounter (Signed)
  Follow up Call-  Call back number 12/01/2020  Post procedure Call Back phone  # [phone]  Permission to leave phone message Yes  Some recent data might be hidden     Patient questions:  Do you have a fever, pain , or abdominal swelling? No. Pain Score  0 *  Have you tolerated food without any problems? Yes.    Have you been able to return to your normal activities? Yes.    Do you have any questions about your discharge instructions: Diet   No. Medications  No. Follow up visit  No.  Do you have questions or concerns about your Care? No.  Actions: * If pain score is 4 or above: No action needed, pain <4.Have you developed a fever since your procedure? no  2.   Have you had an respiratory symptoms (SOB or cough) since your procedure? no  3.   Have you tested positive for COVID 19 since your procedure no  4.   Have you had any family members/close contacts diagnosed with the COVID 19 since your procedure?  no   If yes to any of these questions please route to Joylene John, RN and Joella Prince, RN

## 2020-12-08 ENCOUNTER — Other Ambulatory Visit: Payer: Self-pay

## 2020-12-08 DIAGNOSIS — D509 Iron deficiency anemia, unspecified: Secondary | ICD-10-CM

## 2020-12-10 ENCOUNTER — Other Ambulatory Visit: Payer: Self-pay

## 2020-12-10 MED ORDER — FERROUS SULFATE 325 (65 FE) MG PO TBEC: 325.0000 mg | DELAYED_RELEASE_TABLET | ORAL | 3 refills | Status: DC

## 2020-12-30 ENCOUNTER — Encounter: Payer: Self-pay | Admitting: Gastroenterology

## 2020-12-30 ENCOUNTER — Other Ambulatory Visit (INDEPENDENT_AMBULATORY_CARE_PROVIDER_SITE_OTHER): Payer: Medicaid Other

## 2020-12-30 ENCOUNTER — Ambulatory Visit (INDEPENDENT_AMBULATORY_CARE_PROVIDER_SITE_OTHER): Payer: Medicaid Other | Admitting: Gastroenterology

## 2020-12-30 VITALS — BP 102/60 | HR 60 | Ht 64.0 in | Wt 202.0 lb

## 2020-12-30 DIAGNOSIS — D509 Iron deficiency anemia, unspecified: Secondary | ICD-10-CM | POA: Diagnosis not present

## 2020-12-30 LAB — CBC
HCT: 33.1 % — ABNORMAL LOW (ref 36.0–46.0)
Hemoglobin: 10.5 g/dL — ABNORMAL LOW (ref 12.0–15.0)
MCHC: 31.6 g/dL (ref 30.0–36.0)
MCV: 78.7 fl (ref 78.0–100.0)
Platelets: 191 10*3/uL (ref 150.0–400.0)
RBC: 4.21 Mil/uL (ref 3.87–5.11)
RDW: 19.5 % — ABNORMAL HIGH (ref 11.5–15.5)
WBC: 6.2 10*3/uL (ref 4.0–10.5)

## 2020-12-30 LAB — IBC + FERRITIN
Ferritin: 16.4 ng/mL (ref 10.0–291.0)
Iron: 71 ug/dL (ref 42–145)
Saturation Ratios: 16.7 % — ABNORMAL LOW (ref 20.0–50.0)
TIBC: 424.2 ug/dL (ref 250.0–450.0)
Transferrin: 303 mg/dL (ref 212.0–360.0)

## 2020-12-30 NOTE — Patient Instructions (Addendum)
If you are age 64 or younger, your body mass index should be between 19-25. Your Body mass index is 34.67 kg/m. If this is out of the aformentioned range listed, please consider follow up with your Primary Care Provider.  __________________________________________________________  The [facility] providers would like to encourage you to use [facility] to communicate with providers for non-urgent requests or questions.  Due to long hold times on the telephone, sending your provider a message by [facility] may be a faster and more efficient way to get a response.  Please allow 48 business hours for a response.  Please remember that this is for non-urgent requests.   Your provider has requested that you go to the basement level for lab work before leaving today. Press "B" on the elevator. The lab is located at the first door on the left as you exit the elevator.  Due to recent changes in healthcare laws, you may see the results of your imaging and laboratory studies on MyChart before your provider has had a chance to review them.  We understand that in some cases there may be results that are confusing or concerning to you. Not all laboratory results come back in the same time frame and the provider may be waiting for multiple results in order to interpret others.  Please give [place] 48 hours in order for your provider to thoroughly review all the results before contacting the office for clarification of your results.   Thank you for entrusting me with your care and choosing [facility].  Alonza Bogus, PA-C

## 2020-12-30 NOTE — Progress Notes (Signed)
12/30/2020 Robin Clayton [id number] 07-11-1955   HISTORY OF PRESENT ILLNESS: This is a 65 year old female who I am seeing today in follow-up for her iron deficiency anemia.  She is a patient of Dr. Tarri Clayton.  She underwent both EGD and colonoscopy as below earlier this month for evaluation of her iron deficiency anemia.  There was no bleeding source identified.  There was no evidence of overt GI bleeding and her stools have never been checked for occult blood.  She was started on ferrous sulfate 325 mg only once every other day.  She says that on the days that she takes that her stools are darker, but otherwise no sign of black or bloody stools.  Colonoscopy 12/2020:  - Diverticulosis in the sigmoid colon. - Two 2 to 3 mm polyps in the rectum, removed with a cold snare. Resected and retrieved. - One 2 mm polyp in the transverse colon, removed with a cold snare. Resected and retrieved. - One 2 mm polyp in the ascending colon, removed with a cold snare. Resected and retrieved. - The examination was otherwise normal on direct and retroflexion views.  EGD 12/2020:  - Normal esophagus. - Erythematous mucosa in the gastric body. Biopsied. - Erythematous duodenopathy. Biopsied. - These findings are very mild and are not expected to explain her iron deficiency.  1. Surgical [P], random duodenal sites - PEPTIC DUODENITIS. - NO DYSPLASIA OR MALIGNANCY. 2. Surgical [P], random gastric sites - MILD REACTIVE GASTROPATHY. Robin Clayton IS NEGATIVE FOR HELICOBACTER PYLORI. - NO INTESTINAL METAPLASIA, DYSPLASIA, OR MALIGNANCY. 3. Surgical [P], colon, transverse and ascending, polyp (2) - BENIGN COLONIC MUCOSA. - NO DYSPLASIA OR MALIGNANCY. 4. Surgical [P], colon, rectum, polyp (2) - HYPERPLASTIC POLYP (X2 FRAGMENTS). - NO DYSPLASIA OR MALIGNANCY.  Past Medical History:  Diagnosis Date   Anemia    Anxiety    Atrial tachycardia (HCC)    Bradycardia    Chronic bronchitis (HCC)     Chronic lower back pain    CKD (chronic kidney disease), stage III ([place])    Complete heart block ([place])    a. event monitor in 10/2017 demonstrating nonsustained atrial tachycardia up to 25 beats, sinus bradycardia to sinus tachycardia (30->130bpm), transient complete heart block with ventricular standstill for up to 4 seconds.   Constipation    COPD (chronic obstructive pulmonary disease) (HCC)    GERD (gastroesophageal reflux disease)    takes tums occ   Hyperlipidemia    Hypertension    Morbid obesity ([place])    Obstructive sleep apnea    moderate OSA with AHI 24/hr intolerant to CPAP and could not afford oral device   Sleep apnea    Type II diabetes mellitus ([place])    Past Surgical History:  Procedure Laterality Date   BREAST BIOPSY  12/06/2011   Procedure: BREAST BIOPSY WITH NEEDLE LOCALIZATION;  Surgeon: Rolm Bookbinder, MD;  Location: [facility];  Service: General;  Laterality: Left;   [place] Bilateral     reports that she quit smoking about 5 months ago. Her smoking use included cigarettes. She has a 19.00 pack-year smoking history. She has never used smokeless tobacco. She reports that she does not drink alcohol and does not use drugs. family history includes Asthma in her sister; Cancer in her mother; Diabetes in her father and mother; Heart attack in her father; Hyperlipidemia in her mother; Hypertension in her father and mother; Kidney disease in her father and sister;  Stomach cancer (age of onset: 48) in her mother. Allergies  Allergen Reactions   Wound Dressings Swelling      Outpatient Encounter Medications as of 12/30/2020  Medication Sig   acetaminophen (TYLENOL) 650 MG CR tablet Take 650 mg by mouth as needed for pain.   albuterol (PROVENTIL) (2.5 MG/3ML) 0.083% nebulizer solution Take 2.5 mg by nebulization every 4 (four) hours as needed (shortness of breath).    albuterol (VENTOLIN HFA) 108 (90 Base) MCG/ACT inhaler Inhale  2 puffs into the lungs every 6 (six) hours as needed for wheezing.   aspirin 81 MG tablet Take 81 mg by mouth daily.   atorvastatin (LIPITOR) 80 MG tablet Take 1 tablet (80 mg total) by mouth daily.   budesonide-formoterol (SYMBICORT) 80-4.5 MCG/ACT inhaler Take 2 puffs first thing in am and then another 2 puffs about 12 hours later.   chlorthalidone (HYGROTON) 25 MG tablet Take 1 tablet by mouth daily.   cloNIDine (CATAPRES) 0.1 MG tablet Take 1 tablet by mouth 2 (two) times daily.   diltiazem (CARDIZEM CD) 240 MG 24 hr capsule Take 240 mg by mouth daily.   ferrous sulfate 325 (65 FE) MG EC tablet Take 1 tablet (325 mg total) by mouth every other day.   hydrochlorothiazide (MICROZIDE) 12.5 MG capsule Take 1 capsule by mouth daily.   metFORMIN (GLUCOPHAGE) 500 MG tablet Take 500 mg by mouth 2 (two) times daily.   montelukast (SINGULAIR) 10 MG tablet Take 1 tablet (10 mg total) by mouth daily.   pantoprazole (PROTONIX) 40 MG tablet Take 40 mg by mouth every morning.   sertraline (ZOLOFT) 25 MG tablet Take 25 mg by mouth daily.    valsartan (DIOVAN) 160 MG tablet Take 160 mg by mouth daily.   [DISCONTINUED] famotidine (PEPCID) 20 MG tablet Take 20 mg by mouth daily.   ACCU-CHEK GUIDE test strip USE TO CHECK BLOOD SUGAR DAILY   [DISCONTINUED] cephALEXin (KEFLEX) 500 MG capsule Take 500 mg by mouth 3 (three) times daily.   Facility-Administered Encounter Medications as of 12/30/2020  Medication   0.9 %  sodium chloride infusion     REVIEW OF SYSTEMS  : All other systems reviewed and negative except where noted in the History of Present Illness.   PHYSICAL EXAM: Ht '5\' 4"'$  (1.626 m)   Wt 202 lb (91.6 kg)   LMP 04/30/2012   BMI 34.67 kg/m  General: Well developed AA female in no acute distress Head: Normocephalic and atraumatic Eyes:  Sclerae anicteric, conjunctiva pink. Ears: Normal auditory acuity Lungs: Clear throughout to auscultation; no W/R/R. Heart: Regular rate and rhythm; no  M/R/G. Abdomen: Soft, non-distended.  BS present.  Non-tender. Musculoskeletal: Symmetrical with no gross deformities  Skin: No lesions on visible extremities Extremities: No edema  Neurological: Alert oriented x 4, grossly non-focal Psychological:  Alert and cooperative. Normal mood and affect  ASSESSMENT AND PLAN: *Iron deficiency anemia: EGD and colonoscopy negative for source of GI blood loss.  She has had no overt GI blood loss and stools have never been checked for occult blood.  She has only been on ferrous sulfate 325 mg every other day for about a month.  We will check a repeat CBC and iron studies today.  II am also going to check celiac labs.  We will also check stool for occult blood x 3.  Pending these results we will determine what we want to do with her iron supplementation, including possible need for IV iron and also consider need  for video capsule endoscopy.  CC:  Bernerd Limbo, MD

## 2020-12-31 ENCOUNTER — Other Ambulatory Visit: Payer: Self-pay

## 2020-12-31 ENCOUNTER — Other Ambulatory Visit: Payer: Medicaid Other | Admitting: *Deleted

## 2020-12-31 DIAGNOSIS — E7800 Pure hypercholesterolemia, unspecified: Secondary | ICD-10-CM

## 2020-12-31 LAB — LIPID PANEL
Chol/HDL Ratio: 3.6 ratio (ref 0.0–4.4)
Cholesterol, Total: 151 mg/dL (ref 100–199)
HDL: 42 mg/dL (ref >39)
LDL Chol Calc (NIH): 78 mg/dL (ref 0–99)
Triglycerides: 180 mg/dL — ABNORMAL HIGH (ref 0–149)
VLDL Cholesterol Cal: 31 mg/dL (ref 5–40)

## 2020-12-31 LAB — ALT: ALT: 12 IU/L (ref 0–32)

## 2020-12-31 LAB — TISSUE TRANSGLUTAMINASE, IGA: (tTG) Ab, IgA: <1 U/mL

## 2020-12-31 LAB — IGA: Immunoglobulin A: 323 mg/dL — ABNORMAL HIGH (ref 70–320)

## 2020-12-31 NOTE — Progress Notes (Signed)
Reviewed and agree with management plans. Will proceed with small bowel evaluation with any evidence for occult bleeding and/or persistent anemia not responding to iron supplementation.  Sherrika Weakland L. Tarri Glenn, MD, MPH

## 2021-01-09 ENCOUNTER — Other Ambulatory Visit (INDEPENDENT_AMBULATORY_CARE_PROVIDER_SITE_OTHER): Payer: Medicaid Other

## 2021-01-09 DIAGNOSIS — D509 Iron deficiency anemia, unspecified: Secondary | ICD-10-CM | POA: Diagnosis not present

## 2021-01-09 LAB — HEMOCCULT SLIDES (X 3 CARDS)
Fecal Occult Blood: NEGATIVE
OCCULT 1: NEGATIVE
OCCULT 2: NEGATIVE
OCCULT 3: NEGATIVE
OCCULT 4: NEGATIVE
OCCULT 5: NEGATIVE

## 2021-01-12 ENCOUNTER — Other Ambulatory Visit: Payer: Self-pay

## 2021-01-12 DIAGNOSIS — D509 Iron deficiency anemia, unspecified: Secondary | ICD-10-CM

## 2021-03-05 ENCOUNTER — Ambulatory Visit: Payer: Medicaid Other | Admitting: Internal Medicine

## 2021-03-17 ENCOUNTER — Ambulatory Visit (INDEPENDENT_AMBULATORY_CARE_PROVIDER_SITE_OTHER): Payer: Medicaid Other | Admitting: Internal Medicine

## 2021-03-17 ENCOUNTER — Encounter: Payer: Self-pay | Admitting: Internal Medicine

## 2021-03-17 ENCOUNTER — Other Ambulatory Visit: Payer: Self-pay

## 2021-03-17 VITALS — BP 124/64 | HR 80 | Temp 97.9°F | Ht 64.0 in | Wt 200.0 lb

## 2021-03-17 DIAGNOSIS — Z23 Encounter for immunization: Secondary | ICD-10-CM

## 2021-03-17 DIAGNOSIS — J449 Chronic obstructive pulmonary disease, unspecified: Secondary | ICD-10-CM

## 2021-03-17 NOTE — Progress Notes (Signed)
Subjective:    Patient ID: Robin Clayton, female    DOB: 06-09-1955  MRN: [id number]   Brief patient profile:  59  yobf  Quit smoking completely 04/11/2019  no problems until around 2000 referred to pulmonary clinic 05/08/2012 by Dr Billey Chang with baseline wt 150 but freq steroids required for flares of ab  to wt 253 at first pulmonary clinic ov and documented GOLD II copd 12/05/12    History of Present Illness  05/08/2012 1st ov/ Robin Clayton/ cc 10 years of intermittent cough and sob responsive to short courses of prednisone progressively worse indolent onset doe even in absence of cough to point where sob across the parking lot then developed cp comes goes x 2 months lasting up to a half a day unless takes vicodin. Location is ant, generalized, assoc with overt HB, non-radiating, no worse walking. Coughing does not make it worse. rec Continue symbicort 160 Take 2 puffs first thing in am and then another 2 puffs about 12 hours later.  Pantoprazole 40 mg Take 30-60 min before first meal of the day  GERD  diet Please schedule a follow up office visit in 4 weeks, sooner if needed with pft's on return > did not return  09/15/2012 f/u ov/Robin Clayton copd/ 02 dep at baseline/ now on ACEi but quit smoking effective 08/31/12 Chief Complaint  Patient presents with   Follow-up    Increased DOE x 2 wks, and wheezing for the past 3 days.   sob does improve with neb for an hour or two or rest  But aslo  wakes at night with hoarsness and dry cough day > niight Indolent onset, progressively worse. rec Pepcid 20 ac one at bedtime Stop lisinopril and start micardis 40 mg one daily  Plan A = automatic = symbicort 2 puff followed by one of tudorza twice daily until return Plan B = Backup = proaire up to every 4 hours only  Plan C = Nebulizer every 4 hours Prednisone 10 mg take  4 each am x 2 days,   2 each am x 2 days,  1 each am x2days and stop        03/05/2013 f/u ov/Robin Clayton re: COPD GOLD II Chief Complaint  Patient  presents with   Follow-up    Pt states that her breathing is doing well. She uses albuterol inhaler approx 3 times per wk on average- mainly in the am's.     She is confusing symbicort and saba and turns out she's just using just the symbicort occ in ams maybe 3 x weekly s limiting doe rec Ok to just use the symbicort 160 up to 2 puffs every 12 hours as needed for any respiratory problems but if you start needing the rescue inhaler then really need to be more consistent with use of symbicort (not as needed)    07/10/2013 f/u ov/Robin Clayton re: GOLD II COPD using symbicort 4 x a month / has had 02 x years for prn hs use Chief Complaint  Patient presents with   Follow-up    Pt here to recertify for 02 qhs.  SOB with exertion, no other complaints.   really Not limited by breathing from desired activities   rec Ok to just use the symbicort 160 up to 2 puffs every 12 hours as needed for any respiratory problems but if you start needing the rescue inhaler then really need to be more consistent with use of symbicort (not as needed) 2 puffs perfectly regularly every 12  hours  Please see patient coordinator before you leave today  to schedule overnight 02 sats room air > did not do      12/03/2016  f/u ov/Robin Clayton re:  Copd II/ needs clearance for R hip replacement  Chief Complaint  Patient presents with   Pulmonary Consult    Pt states needing pulmonary clearance for total hip replacement- rt. She states her surgeon (can not recall his name at the time) noticed she had a cough and wanted her to be cleared. Pt states she has occ cough with clear sputum.  She has not needed albuterol inhaler or neb.   maint on symb 80 2bid and no need for saba - Not limited by breathing from desired activities  But by hips cough is random x one month, doesn't wake her up On protonix 40 mg randomly if takes at all  rec Work on inhaler technique:  Pantoprazole (protonix) 40 mg   Take  30-60 min before first meal of the day and  Pepcid (famotidine)  20 mg one @  bedtime until return to office - this is the best way to tell whether stomach acid is contributing to your problem.   GERD (REFLUX)  You are cleared for surgery  >  Had bilateral THR > much better mobility    03/23/2019  Ext ov/Robin Clayton re: re establish GOLD II copd with  worse sob/ cough x 3 m on symb  / protonix  Chief Complaint  Patient presents with   Acute Visit    Patient reports that she's sob with exertion. She reports that she has alot of congestion in her chest and her sputum is brown in color.   Dyspnea:  Push cart at [employer] and [employer] parking/ much worse x last week or two   Cough: worse also x sev weeks = brown esp in am  no abx or prednisone recently  Sleeping: can't do cpap x one year, on 2lpm hs and sleeps on L side 3 pillows and feels rested  SABA use: 4 x daily  02: 2lpm hs only  rec Symbicort 80 Take 2 puffs first thing in am and then another 2 puffs about 12 hours later.  Pantoprazole (protonix) 40 mg   Take  30-60 min before first meal of the day and Pepcid (famotidine)  20 mg one after supper  until return to office -   GERD Prednisone 10 mg take  4 each am x 2 days,   2 each am x 2 days,  1 each am x 2 days and stop  Zpak  For cough > mucinex dm up to 1200 mg every 12 hours as needed  Please remember to go to the  x-ray department  for your tests - we will call you with the results when they are available Please schedule a follow up office visit in 4 weeks, sooner if needed  with all medications /inhalers/ solutions in hand    04/23/2019  f/u ov/Robin Clayton re: GOLD II/ much better off cigs stopped all inhalers and pepcid hs  Chief Complaint  Patient presents with   Follow-up    Patient reports she still has sob with exertion. She reports that she has quit smoking for 12 days now.   Dyspnea:  Mopping /no steps = MMRC2 = can't walk a nl pace on a flat grade s sob but does fine slow and flat  Cough: resolved  Sleeping: on 2lpm / 3 pillows   SABA use: none at all /  no smb or neb  02: none belching p ppi s overt HB Rec If breathing or coughing get worse > resume symb 80 Take 2 puffs first thing in am and then another 2 puffs about 12 hours later.    NP recs 09/02/20  Continue to stay active and work on weight loss 20-30 lbs  Continue Symbicort 80 two puffs morning and evening  Use albuterol 2 puffs every 6 hours as needed for breakthrough shortness of breath/wheezing     03/17/2021  f/u ov/Robin Clayton re: GOLD 2 copd  maint on symbicort 80 but confused with details of care Chief Complaint  Patient presents with   Follow-up    Breathing is unchanged since the last visit. She rarely uses her albuterol inhaler or neb.    Dyspnea:  MMRC2 = can't walk a nl pace on a flat grade s sob but does fine slow and flat  Cough: none  Sleeping: 3 pillows bed flat/ 2lpm hs  SABA use: rarely  02: none during the day Covid status:   vax x 3    No obvious day to day or daytime variability or assoc excess/ purulent sputum or mucus plugs or hemoptysis or cp or chest tightness, subjective wheeze or overt sinus or hb symptoms.   sleeping without nocturnal  or early am exacerbation  of respiratory  c/o's or need for noct saba. Also denies any obvious fluctuation of symptoms with weather or environmental changes or other aggravating or alleviating factors except as outlined above   No unusual exposure hx or h/o childhood pna/ asthma or knowledge of premature birth.  Current Allergies, Complete Past Medical History, Past Surgical History, Family History, and Social History were reviewed in [employer] record.  ROS  The following are not active complaints unless bolded Hoarseness, sore throat, dysphagia, dental problems, itching, sneezing,  nasal congestion or discharge of excess mucus or purulent secretions, ear ache,   fever, chills, sweats, unintended wt loss or wt gain, classically pleuritic or exertional cp,  orthopnea pnd or  arm/hand swelling  or leg swelling, presyncope, palpitations, abdominal pain, anorexia, nausea, vomiting, diarrhea  or change in bowel habits or change in bladder habits, change in stools or change in urine, dysuria, hematuria,  rash, arthralgias, visual complaints, headache, numbness, weakness or ataxia or problems with walking or coordination,  change in mood or  memory.        Current Meds  Medication Sig   ACCU-CHEK GUIDE test strip USE TO CHECK BLOOD SUGAR DAILY   acetaminophen (TYLENOL) 650 MG CR tablet Take 650 mg by mouth as needed for pain.   albuterol (PROVENTIL) (2.5 MG/3ML) 0.083% nebulizer solution Take 2.5 mg by nebulization every 4 (four) hours as needed (shortness of breath).    albuterol (VENTOLIN HFA) 108 (90 Base) MCG/ACT inhaler Inhale 2 puffs into the lungs every 6 (six) hours as needed for wheezing.   aspirin 81 MG tablet Take 81 mg by mouth daily.   atorvastatin (LIPITOR) 80 MG tablet Take 1 tablet (80 mg total) by mouth daily.   budesonide-formoterol (SYMBICORT) 80-4.5 MCG/ACT inhaler Take 2 puffs first thing in am and then another 2 puffs about 12 hours later.   chlorthalidone (HYGROTON) 25 MG tablet Take 1 tablet by mouth daily.   cloNIDine (CATAPRES) 0.1 MG tablet Take 1 tablet by mouth 2 (two) times daily.   diltiazem (CARDIZEM CD) 240 MG 24 hr capsule Take 240 mg by mouth daily.   ferrous sulfate 325 (65  FE) MG EC tablet Take 1 tablet (325 mg total) by mouth every other day.   hydrochlorothiazide (MICROZIDE) 12.5 MG capsule Take 1 capsule by mouth daily.   metFORMIN (GLUCOPHAGE) 500 MG tablet Take 500 mg by mouth 2 (two) times daily.   montelukast (SINGULAIR) 10 MG tablet Take 1 tablet (10 mg total) by mouth daily.   pantoprazole (PROTONIX) 40 MG tablet Take 40 mg by mouth every morning.   sertraline (ZOLOFT) 25 MG tablet Take 25 mg by mouth daily.    valsartan (DIOVAN) 160 MG tablet Take 160 mg by mouth daily.   Current Facility-Administered Medications for the  03/17/21 encounter (Office Visit) with [name], MD  Medication   0.9 %  sodium chloride infusion                  Objective:   Physical Exam   03/17/2021     200  04/23/2019     207  03/23/2019     210  12/05/2012         223 > 217 03/05/2013 > 207 07/10/2013 > 01/03/2015   204 > 12/03/2016  190              09/15/12 245 lb (111.131 kg)  05/08/12 253 lb 3.2 oz (114.851 kg)  12/17/11 241 lb 4 oz (109.43 kg)    Vital signs reviewed  03/17/2021  - Note at rest 02 sats  94% on RA   General appearance:    obese bf nad    HEENT : pt wearing mask not removed for exam due to covid - 19 concerns.    NECK :  without JVD/Nodes/TM/ nl carotid upstrokes bilaterally   LUNGS: no acc muscle use,  Mild barrel  contour chest wall with bilateral  Distant bs s audible wheeze and  without cough on insp or exp maneuvers  and mild  Hyperresonant  to  percussion bilaterally     CV:  RRR  no s3 or murmur or increase in P2, and no edema   ABD:  soft and nontender with pos end  insp Hoover's  in the supine position. No bruits or organomegaly appreciated, bowel sounds nl  MS:   Nl gait/  ext warm without deformities, calf tenderness, cyanosis or clubbing No obvious joint restrictions   SKIN: warm and dry without lesions    NEURO:  alert, approp, nl sensorium with  no motor or cerebellar deficits apparent.           I personally reviewed images and agree with radiology impression as follows:   Chest LDSCT  09/30/20 Lung-RADSTM CATEGORY:  1, negative.     Assessment & Plan:

## 2021-03-17 NOTE — Patient Instructions (Signed)
No change in medications   Please schedule a follow up visit in 12  months but call sooner if needed  

## 2021-03-18 ENCOUNTER — Encounter: Payer: Self-pay | Admitting: Internal Medicine

## 2021-03-18 NOTE — Assessment & Plan Note (Signed)
Quit smoking  04/11/2019 - ACEi d/c    09/16/2012 due to cough - 12/05/2012 PFTs FEV1 1.22 (58%)  65 and no better p B2,  DLCO 59 and 74% - 03/23/2019   try symb 80 2bid due to prominent hoarseness/ pseudowheezes> improved so stopped all inhalers by f/u 04/23/2019  - PFT's  09/02/20 FEV1 1.09 (56 % ) ratio 0.65  p 2 % improvement from saba p symbicort prior to study with DLCO  11.74 (60%) corrects to 3.49 (82%)  for alv volume and FV curve mildly concave   She actually has only mild airflow obst with most of the problem restrictive and relatively well compensated so no change rx needed   F/u can be yearly, sooner prn flares          Each maintenance medication was reviewed in detail including emphasizing most importantly the difference between maintenance and prns and under what circumstances the prns are to be triggered using an action plan format where appropriate.  Total time for H and P, chart review, counseling, reviewing hfa device(s) and generating customized AVS unique to this office visit / same day charting = 25 min

## 2021-05-05 ENCOUNTER — Other Ambulatory Visit: Payer: Self-pay | Admitting: Adult Health

## 2021-05-12 ENCOUNTER — Other Ambulatory Visit: Payer: Self-pay | Admitting: Adult Health

## 2021-05-12 DIAGNOSIS — J449 Chronic obstructive pulmonary disease, unspecified: Secondary | ICD-10-CM

## 2021-06-02 ENCOUNTER — Other Ambulatory Visit: Payer: Self-pay | Admitting: Cardiology

## 2021-06-14 ENCOUNTER — Encounter (HOSPITAL_COMMUNITY): Payer: Self-pay

## 2021-06-14 ENCOUNTER — Emergency Department (HOSPITAL_COMMUNITY): Payer: Medicaid Other

## 2021-06-14 ENCOUNTER — Emergency Department (HOSPITAL_COMMUNITY)
Admission: EM | Admit: 2021-06-14 | Discharge: 2021-06-14 | Disposition: A | Discharge status: Home / Self Care | Payer: Medicaid Other | Attending: Emergency Medicine | Admitting: Emergency Medicine

## 2021-06-14 ENCOUNTER — Other Ambulatory Visit: Payer: Self-pay

## 2021-06-14 DIAGNOSIS — Z7951 Long term (current) use of inhaled steroids: Secondary | ICD-10-CM | POA: Insufficient documentation

## 2021-06-14 DIAGNOSIS — J449 Chronic obstructive pulmonary disease, unspecified: Secondary | ICD-10-CM | POA: Insufficient documentation

## 2021-06-14 DIAGNOSIS — R0602 Shortness of breath: Secondary | ICD-10-CM | POA: Insufficient documentation

## 2021-06-14 DIAGNOSIS — Z7982 Long term (current) use of aspirin: Secondary | ICD-10-CM | POA: Insufficient documentation

## 2021-06-14 DIAGNOSIS — E1122 Type 2 diabetes mellitus with diabetic chronic kidney disease: Secondary | ICD-10-CM | POA: Diagnosis not present

## 2021-06-14 DIAGNOSIS — Z79899 Other long term (current) drug therapy: Secondary | ICD-10-CM | POA: Diagnosis not present

## 2021-06-14 DIAGNOSIS — R0689 Other abnormalities of breathing: Secondary | ICD-10-CM | POA: Diagnosis not present

## 2021-06-14 DIAGNOSIS — N189 Chronic kidney disease, unspecified: Secondary | ICD-10-CM | POA: Diagnosis not present

## 2021-06-14 DIAGNOSIS — R110 Nausea: Secondary | ICD-10-CM | POA: Insufficient documentation

## 2021-06-14 DIAGNOSIS — Z20822 Contact with and (suspected) exposure to covid-19: Secondary | ICD-10-CM | POA: Insufficient documentation

## 2021-06-14 DIAGNOSIS — R079 Chest pain, unspecified: Secondary | ICD-10-CM | POA: Insufficient documentation

## 2021-06-14 DIAGNOSIS — Z7984 Long term (current) use of oral hypoglycemic drugs: Secondary | ICD-10-CM | POA: Diagnosis not present

## 2021-06-14 DIAGNOSIS — D649 Anemia, unspecified: Secondary | ICD-10-CM | POA: Insufficient documentation

## 2021-06-14 DIAGNOSIS — I129 Hypertensive chronic kidney disease with stage 1 through stage 4 chronic kidney disease, or unspecified chronic kidney disease: Secondary | ICD-10-CM | POA: Insufficient documentation

## 2021-06-14 LAB — CBC
HCT: 34 % — ABNORMAL LOW (ref 36.0–46.0)
Hemoglobin: 11 g/dL — ABNORMAL LOW (ref 12.0–15.0)
MCH: 27.7 pg (ref 26.0–34.0)
MCHC: 32.4 g/dL (ref 30.0–36.0)
MCV: 85.6 fL (ref 80.0–100.0)
Platelets: 220 10*3/uL (ref 150–400)
RBC: 3.97 MIL/uL (ref 3.87–5.11)
RDW: 16.7 % — ABNORMAL HIGH (ref 11.5–15.5)
WBC: 7.9 10*3/uL (ref 4.0–10.5)
nRBC: 0 % (ref 0.0–0.2)

## 2021-06-14 LAB — RESP PANEL BY RT-PCR (FLU A&B, COVID) ARPGX2
Influenza A by PCR: NEGATIVE
Influenza B by PCR: NEGATIVE
SARS Coronavirus 2 by RT PCR: NEGATIVE

## 2021-06-14 LAB — BASIC METABOLIC PANEL
Anion gap: 10 (ref 5–15)
BUN: 33 mg/dL — ABNORMAL HIGH (ref 8–23)
CO2: 26 mmol/L (ref 22–32)
Calcium: 9.2 mg/dL (ref 8.9–10.3)
Chloride: 100 mmol/L (ref 98–111)
Creatinine, Ser: 1.57 mg/dL — ABNORMAL HIGH (ref 0.44–1.00)
GFR, Estimated: 37 mL/min — ABNORMAL LOW (ref >60)
Glucose, Bld: 138 mg/dL — ABNORMAL HIGH (ref 70–99)
Potassium: 4.3 mmol/L (ref 3.5–5.1)
Sodium: 136 mmol/L (ref 135–145)

## 2021-06-14 LAB — TROPONIN I (HIGH SENSITIVITY)
Troponin I (High Sensitivity): 7 ng/L (ref <18)
Troponin I (High Sensitivity): 7 ng/L (ref <18)

## 2021-06-14 MED ORDER — ASPIRIN 325 MG PO TABS
325.0000 mg | ORAL_TABLET | Freq: Every day | ORAL | Status: DC
  Administered 2021-06-14: 325 mg via ORAL
  Filled 2021-06-14: qty 1

## 2021-06-14 MED ORDER — ALUM & MAG HYDROXIDE-SIMETH 200-200-20 MG/5ML PO SUSP
15.0000 mL | Freq: Once | ORAL | Status: AC
  Administered 2021-06-14: 15 mL via ORAL
  Filled 2021-06-14: qty 30

## 2021-06-14 NOTE — ED Provider Notes (Signed)
[facility] EMERGENCY DEPARTMENT Provider Note   CSN: [id number] Arrival date & time: 06/14/21  1017     History  Chief Complaint  Patient presents with   Chest Pain    Robin Clayton is a 66 y.o. female.   Chest Pain  Patient with history of cardiac arrhythmias, hypertension, hyperlipidemia, OSA, type 2 diabetes, COPD, CKD presents due to chest pain.  States it started this morning, it is substernal and does not radiate.  It is constant, worsens drinks water.  Unable to characterize the pain as sharp or stabbing or dull.  Denies any history of pain like this previously, not positional or exertional.  She does endorse associated nausea and shortness of breath, no vomiting.  Patient is followed by cardiology (Dr. Radford Pax, [facility], has documented history of transient complete heart block but did not require pacemaker.  Patient has a smoking history but has not smoked cigarettes since at least 202`1  Home Medications Prior to Admission medications   Medication Sig Start Date End Date Taking? Authorizing Provider  acetaminophen (TYLENOL) 500 MG tablet Take 1,000 mg by mouth every 6 (six) hours as needed for mild pain.   Yes [provider]  albuterol (PROVENTIL) (2.5 MG/3ML) 0.083% nebulizer solution Take 2.5 mg by nebulization every 4 (four) hours as needed (shortness of breath).  05/14/14  Yes [provider]  albuterol (VENTOLIN HFA) 108 (90 Base) MCG/ACT inhaler Inhale 2 puffs into the lungs every 6 (six) hours as needed for wheezing.   Yes [provider]  aspirin 81 MG tablet Take 81 mg by mouth daily.   Yes [provider]  atorvastatin (LIPITOR) 80 MG tablet TAKE 1 TABLET(80 MG) BY MOUTH DAILY Patient taking differently: Take 80 mg by mouth at bedtime. 06/02/21  Yes Turner, Eber Hong, MD  chlorthalidone (HYGROTON) 25 MG tablet Take 25 mg by mouth daily. 09/18/19  Yes [provider]  cloNIDine (CATAPRES) 0.1 MG tablet  Take 0.1 mg by mouth daily. Also takes 0.2mg  at bedtime 09/28/19  Yes [provider]  cloNIDine (CATAPRES) 0.2 MG tablet Take 0.2 mg by mouth at bedtime. Also takes 0.1mg  once daily   Yes [provider]  diltiazem (CARDIZEM CD) 240 MG 24 hr capsule Take 240 mg by mouth at bedtime.   Yes [provider]  ferrous sulfate 325 (65 FE) MG EC tablet Take 1 tablet (325 mg total) by mouth every other day. 12/10/20  Yes Thornton Park, MD  hydrochlorothiazide (MICROZIDE) 12.5 MG capsule Take 12.5 mg by mouth daily. 03/10/18  Yes [provider]  metFORMIN (GLUCOPHAGE) 500 MG tablet Take 500 mg by mouth See admin instructions. 500mg  in the afternoon and 500mg  at bedtime 06/16/17  Yes [provider]  montelukast (SINGULAIR) 10 MG tablet TAKE 1 TABLET(10 MG) BY MOUTH DAILY Patient taking differently: Take 10 mg by mouth daily in the afternoon. 05/06/21  Yes [name], MD  pantoprazole (PROTONIX) 40 MG tablet Take 40 mg by mouth every morning. 12/18/20  Yes [provider]  sertraline (ZOLOFT) 25 MG tablet Take 25 mg by mouth daily.  06/11/19  Yes [provider]  SYMBICORT 80-4.5 MCG/ACT inhaler TAKE 2 PUFFS FIRST THING IN THE AM AND THEN 2 PUFFS 12 HOURS LATER Patient taking differently: Inhale 2 puffs into the lungs 2 (two) times daily as needed (wheezing/shortness of breath). 05/12/21  Yes Parrett, Tammy S, NP  valsartan (DIOVAN) 160 MG tablet Take 160 mg by mouth at bedtime.  Yes [provider]      Allergies    Wound dressings    Review of Systems   Review of Systems  Cardiovascular:  Positive for chest pain.   Physical Exam Updated Vital Signs BP (!) 146/73    Pulse 63    Temp 97.9 F (36.6 C)    Resp 16    Ht 5' 3.5" (1.613 m)    Wt 90.7 kg    LMP 04/30/2012    SpO2 99%    BMI 34.87 kg/m  Physical Exam Vitals and nursing note reviewed. Exam conducted with a chaperone present.  Constitutional:      Appearance: Normal  appearance. She is obese.  HENT:     Head: Normocephalic and atraumatic.  Eyes:     General: No scleral icterus.       Right eye: No discharge.        Left eye: No discharge.     Extraocular Movements: Extraocular movements intact.     Pupils: Pupils are equal, round, and reactive to light.  Cardiovascular:     Rate and Rhythm: Normal rate and regular rhythm.     Pulses: Normal pulses.     Heart sounds: Normal heart sounds. No murmur heard.   No friction rub. No gallop.  Pulmonary:     Effort: Pulmonary effort is normal. No respiratory distress.     Breath sounds: Normal breath sounds.     Comments: Diminished lung sounds bilaterally Abdominal:     General: Abdomen is flat. Bowel sounds are normal. There is no distension.     Palpations: Abdomen is soft.     Tenderness: There is no abdominal tenderness.  Skin:    General: Skin is warm and dry.     Coloration: Skin is not jaundiced.  Neurological:     Mental Status: She is alert. Mental status is at baseline.     Coordination: Coordination normal.    ED Results / Procedures / Treatments   Labs (all labs ordered are listed, but only abnormal results are displayed) Labs Reviewed  BASIC METABOLIC PANEL - Abnormal; Notable for the following components:      Result Value   Glucose, Bld 138 (*)    BUN 33 (*)    Creatinine, Ser 1.57 (*)    GFR, Estimated 37 (*)    All other components within normal limits  CBC - Abnormal; Notable for the following components:   Hemoglobin 11.0 (*)    HCT 34.0 (*)    RDW 16.7 (*)    All other components within normal limits  RESP PANEL BY RT-PCR (FLU A&B, COVID) ARPGX2  TROPONIN I (HIGH SENSITIVITY)  TROPONIN I (HIGH SENSITIVITY)    EKG EKG Interpretation  Date/Time:  Sunday June 14 2021 10:23:13 EST Ventricular Rate:  60 PR Interval:  153 QRS Duration: 84 QT Interval:  413 QTC Calculation: 413 R Axis:   27 Text Interpretation: Sinus rhythm Low voltage, precordial leads  Probable anteroseptal infarct, old No significant change since prior 12/20 Confirmed by Aletta Edouard [phone]) on 06/14/2021 10:24:51 AM  Radiology DG Chest Port 1 View  Result Date: 06/14/2021 CLINICAL DATA:  66 year old female with history of chest pain. EXAM: PORTABLE CHEST 1 VIEW COMPARISON:  Chest x-ray 07/31/2019. FINDINGS: Lung volumes are normal. No consolidative airspace disease. No pleural effusions. No pneumothorax. No pulmonary nodule or mass noted. Pulmonary vasculature and the cardiomediastinal silhouette are within normal limits. IMPRESSION: No radiographic evidence of acute cardiopulmonary disease. Electronically  Signed   By: Vinnie Langton M.D.   On: 06/14/2021 10:56    Procedures Procedures    Medications Ordered in ED Medications  aspirin tablet 325 mg (325 mg Oral Given 06/14/21 1140)  alum & mag hydroxide-simeth (MAALOX/MYLANTA) 200-200-20 MG/5ML suspension 15 mL (15 mLs Oral Given 06/14/21 1140)    ED Course/ Medical Decision Making/ A&P Clinical Course as of 06/14/21 1315  Sun Jun 14, 1536  6330 66 year old female here with chest pain since waking up this morning.  She denies history of same.  Pain is improved now.  EKG unremarkable.  Getting serial troponins.  Possible discharge if stable with outpatient cardiology follow-up [MB]    Clinical Course User Index [MB] Hayden Rasmussen, MD                           Medical Decision Making Amount and/or Complexity of Data Reviewed Labs: ordered. Radiology: ordered.  Risk OTC drugs.   This patient presents to the ED for concern of chest pain, this involves an extensive number of treatment options, and is a complaint that carries with it a high risk of complications and morbidity.  The differential diagnosis includes ACS, pneumonia, GERD, dissection, pneumothorax, other   Co morbidities that complicate the patient evaluation: Hypertension, diabetes, arrhythmia, hyperlipidemia, morbid obesity,  COPD   Additional history obtained: -External records from outside source obtained and reviewed including: Chart review including previous notes, labs, imaging, consultation notes -I reviewed the patient's visit from 09/30/2020 with cardiology.   Lab Tests: -I ordered, reviewed, and interpreted labs.  The pertinent results include: Negative delta troponin.  No leukocytosis.  She has stable anemia at 11 hemoglobin.  There is no gross electrolyte derangement, creatinine is elevated 1.57 but this is roughly at her baseline per chart review.  Negative for COVID, flu.   EKG -Roughly at baseline when compared to previous EKG, no ischemic findings.  No underlying arrhythmia or heart block.   Imaging Studies ordered: -I ordered imaging studies including chest x-ray.  -I independently visualized and interpreted imaging which showed no acute process -I agree with the radiologist interpretation   Medicines ordered and prescription drug management: -I ordered medication including aspirin and Maalox for pain  -Reevaluation of the patient after these medicines showed that the patient improved -I have reviewed the patients home medicines and have made adjustments as needed   ED Course: Patient is a 66 year old female with many risk factors.  Her heart score is a 4.  She is hypertensive, no tachypnea or hypoxia.  Physical exam unremarkable.  Given negative troponin and no ischemic findings I do not have a high suspicion for acute ACS.  Additionally the pain seems to be positional and food related making GERD more likely.  Does not radiate, I considered but think dissection unlikely.  No evidence of an underlying CHF, no underlying pneumonia or pneumothorax.  Her creatinine is roughly at baseline, do not think she necessarily needs to come in for fluid rehydration.  I discussed this patient with my attending Dr. Melina Copa.  He is in agreement, will plan for outpatient follow-up with cardiology.   Cardiac  Monitoring: The patient was maintained on a cardiac monitor.  I personally viewed and interpreted the cardiac monitored which showed an underlying rhythm of: NSR   Reevaluation: After the interventions noted above, I reevaluated the patient and found that they have :improved   Dispostion: D/C. Cardiology F/U  Discussed HPI, physical  exam and plan of care for this patient with attending Aletta Edouard. The attending physician evaluated this patient as part of a shared visit and agrees with plan of care.          Final Clinical Impression(s) / ED Diagnoses Final diagnoses:  None    Rx / DC Orders ED Discharge Orders     None         Sherrill Raring, [place] 06/14/21 1315    Hayden Rasmussen, MD 06/14/21 1730

## 2021-06-14 NOTE — ED Triage Notes (Signed)
Pt arrives via ems. Pt states she woke up this morning with chest pain. States it's a burning sensation that worsens whenever she drinks water.

## 2021-06-14 NOTE — Discharge Instructions (Signed)
Follow-up with your cardiologist this week.  Please give them a call on Monday and let them know you are seen today.  In the meantime take Tylenol and ibuprofen as needed for pain.  Return to the ED if the pain becomes worse, changes, or if new concerning symptoms present.

## 2021-07-07 ENCOUNTER — Other Ambulatory Visit: Payer: Self-pay

## 2021-07-08 ENCOUNTER — Other Ambulatory Visit: Payer: Self-pay

## 2021-07-08 ENCOUNTER — Encounter: Payer: Self-pay | Admitting: Obstetrics and Gynecology

## 2021-07-08 ENCOUNTER — Ambulatory Visit (INDEPENDENT_AMBULATORY_CARE_PROVIDER_SITE_OTHER): Payer: Medicaid Other | Admitting: Obstetrics and Gynecology

## 2021-07-08 ENCOUNTER — Other Ambulatory Visit (HOSPITAL_COMMUNITY)
Admission: RE | Admit: 2021-07-08 | Discharge: 2021-07-08 | Disposition: A | Discharge status: Home / Self Care | Payer: Medicaid Other | Source: Ambulatory Visit | Attending: Obstetrics and Gynecology | Admitting: Obstetrics and Gynecology

## 2021-07-08 DIAGNOSIS — N950 Postmenopausal bleeding: Secondary | ICD-10-CM | POA: Diagnosis not present

## 2021-07-08 NOTE — Patient Instructions (Addendum)
ENDOMETRIAL BIOPSY POST-PROCEDURE INSTRUCTIONS ? ?You may take Ibuprofen, Aleve or Tylenol for pain if needed.  Cramping should resolve within in 24 hours. ? ?You may have a small amount of spotting.  You should wear a mini pad for the next few days. ? ?You may have intercourse after 24 hours. ? ?You need to call if you have any pelvic pain, fever, heavy bleeding or foul smelling vaginal discharge. ? ?Shower or bathe as normal ? ?6. We will call you within one week with results or we will discuss   the results at your follow-up appointment if needed. ? Postmenopausal Bleeding ?Postmenopausal bleeding is any bleeding that occurs after menopause. Menopause is a time in a woman's life when monthly periods stop. Any type of bleeding after menopause should be checked by your doctor. Treatment will depend on the cause. ?This kind of bleeding can be caused by: ?Taking hormones during menopause. ?Low or high amounts of female hormones in the body. This can cause the lining of the womb (uterus) to become too thin or too thick. ?Cancer. ?Growths in the womb that are not cancer. ?Follow these instructions at home: ? ?Watch for any changes in your symptoms. Let your doctor know about them. ?Avoid using tampons and douches as told by your doctor. ?Change your pads regularly. ?Get regular pelvic exams. This includes Pap tests. ?Take iron pills as told by your doctor. ?Take over-the-counter and prescription medicines only as told by your doctor. ?Keep all follow-up visits. ?Contact a doctor if: ?You have new bleeding from the vagina after menopause. ?You have pain in your belly (abdomen). ?Get help right away if: ?You have a fever or chills. ?You have very bad pain with bleeding. ?You have clumps of blood (blood clots) coming from your vagina. ?You have a lot of bleeding, and: ?You use more than 1 pad an hour. ?This kind of bleeding has never happened before. ?You have headaches. ?You feel dizzy or you feel like you are going to  pass out (faint). ?Summary ?Any type of bleeding after menopause should be checked by your doctor. ?Avoid using tampons or douches. ?Get regular pelvic exams. This includes Pap tests. ?Contact a doctor if you have new bleeding or pain in your belly. ?Watch for any changes in your symptoms. Let your doctor know about them. ?This information is not intended to replace advice given to you by your health care provider. Make sure you discuss any questions you have with your health care provider. ?Document Revised: 10/04/2019 Document Reviewed: 10/04/2019 ?Elsevier Patient Education ? [place]. ? ?

## 2021-07-08 NOTE — Progress Notes (Signed)
Patient ID: Robin Clayton, female   DOB: May 11, 1955, 66 y.o.   MRN: [id number] [name] presents for evaluation of PMB. Reports episode of bleeding last month x 1 day. LMP age 65. ?No sexual active. Last pap 2020 negative. ?Medical problems as listed ?TSVD x 3 ? LTCS x 4 ? ?PE AF VSS ?Lungs clear Heart RRR ?Abd soft + BS ?GU Nl EGBUS, cervix,  noted, pap smear obtained, uterus small, mobile, no masses or tenderness. ? ?ENDOMETRIAL BIOPSY     ?The indications for endometrial biopsy were reviewed.   Risks of the biopsy including cramping, bleeding, infection, uterine perforation, inadequate specimen and need for additional procedures  were discussed. The patient states she understands and agrees to undergo procedure today. Consent was signed. Time out was performed. Urine HCG was negative. ?During the pelvic exam, the cervix was prepped with Betadine. A single-toothed tenaculum was placed on the anterior lip of the cervix to stabilize it. The 3 mm pipelle was introduced into the endometrial cavity without difficulty to a depth of 7cm, and a moderate amount of tissue was obtained and sent to pathology. The instruments were removed from the patient's vagina. Minimal bleeding from the cervix was noted. The patient tolerated the procedure well. Routine post-procedure instructions were given to the patient.    ? ?A/P PMB ? ?Reviewed with pt. EMBX and pap smear today. Will check GYN U/S. ?F/U as per test results. ?

## 2021-07-08 NOTE — Progress Notes (Signed)
66 y.o New GYN referral from [facility] for Postmenopausal bleeding for 1 day, 2 weeks ago and it has not happened since. ? ?Last Mammogram 01/2021 ?Last PAP 12/12/2018 ?

## 2021-07-09 LAB — SURGICAL PATHOLOGY

## 2021-07-10 LAB — CYTOLOGY - PAP
Comment: NEGATIVE
Diagnosis: NEGATIVE
High risk HPV: NEGATIVE

## 2021-07-11 ENCOUNTER — Other Ambulatory Visit (HOSPITAL_BASED_OUTPATIENT_CLINIC_OR_DEPARTMENT_OTHER): Payer: Medicaid Other

## 2021-07-13 ENCOUNTER — Other Ambulatory Visit (HOSPITAL_BASED_OUTPATIENT_CLINIC_OR_DEPARTMENT_OTHER): Payer: Self-pay

## 2021-07-13 ENCOUNTER — Other Ambulatory Visit: Payer: Self-pay

## 2021-07-13 ENCOUNTER — Ambulatory Visit (HOSPITAL_BASED_OUTPATIENT_CLINIC_OR_DEPARTMENT_OTHER)
Admission: RE | Admit: 2021-07-13 | Discharge: 2021-07-13 | Disposition: A | Discharge status: Home / Self Care | Payer: Medicaid Other | Source: Ambulatory Visit | Attending: Obstetrics and Gynecology | Admitting: Obstetrics and Gynecology

## 2021-07-13 DIAGNOSIS — N950 Postmenopausal bleeding: Secondary | ICD-10-CM | POA: Insufficient documentation

## 2021-07-29 ENCOUNTER — Telehealth: Payer: Self-pay | Admitting: Emergency Medicine

## 2021-07-29 NOTE — Telephone Encounter (Signed)
Attempted call to pt to discuss results. Left generic message to return call to clinic.  ?

## 2021-07-31 ENCOUNTER — Other Ambulatory Visit: Payer: Self-pay | Admitting: *Deleted

## 2021-07-31 DIAGNOSIS — N950 Postmenopausal bleeding: Secondary | ICD-10-CM

## 2021-07-31 NOTE — Progress Notes (Signed)
Order for CA 125 placed per Dr. Rip Harbour request for follow up on nodule found on [place]. Pt to come for lab only draw. Then follow up face to face with Dr. Rip Harbour in 3-4 weeks. ?

## 2021-07-31 NOTE — Progress Notes (Signed)
TC from pt returning call from 07/29/21. Informed of results per Dr. Rip Harbour. Advised of his recommendation for CA 125 lab and follow up face to face with him in 3-4 wks. Patient verbalized understanding. Call transferred to schedulers.

## 2021-08-05 ENCOUNTER — Other Ambulatory Visit: Payer: Medicaid Other

## 2021-08-05 ENCOUNTER — Other Ambulatory Visit: Payer: Self-pay | Admitting: *Deleted

## 2021-08-05 DIAGNOSIS — Z1239 Encounter for other screening for malignant neoplasm of breast: Secondary | ICD-10-CM

## 2021-08-06 LAB — CA 125: Cancer Antigen (CA) 125: 7.3 U/mL (ref 0.0–38.1)

## 2021-08-18 ENCOUNTER — Telehealth: Payer: Self-pay | Admitting: Internal Medicine

## 2021-08-19 NOTE — Telephone Encounter (Signed)
Called home care delivered. Aldona Bar was in a meeting at the time but the representative I talked to took down our fax number and said she was going to have [place] re-fax the paper work over. Nothing further needed.  ?

## 2021-08-20 ENCOUNTER — Telehealth: Payer: Self-pay

## 2021-08-20 NOTE — Telephone Encounter (Signed)
Signed and faxed to home care delivered. Nothing further needed at this time.  ?

## 2021-08-20 NOTE — Telephone Encounter (Signed)
Received fax from home care delivered req. Order for pulse ox to be signed and ov notes. Printed Last OV notes and will have Dr. Melvyn Novas sign  ?

## 2021-08-21 ENCOUNTER — Other Ambulatory Visit: Payer: Self-pay | Admitting: Gastroenterology

## 2021-08-26 ENCOUNTER — Ambulatory Visit (INDEPENDENT_AMBULATORY_CARE_PROVIDER_SITE_OTHER): Payer: Medicaid Other | Admitting: Obstetrics and Gynecology

## 2021-08-26 ENCOUNTER — Encounter: Payer: Self-pay | Admitting: Obstetrics and Gynecology

## 2021-08-26 VITALS — BP 128/72 | HR 66 | Wt 201.0 lb

## 2021-08-26 DIAGNOSIS — N838 Other noninflammatory disorders of ovary, fallopian tube and broad ligament: Secondary | ICD-10-CM | POA: Diagnosis not present

## 2021-08-26 DIAGNOSIS — N950 Postmenopausal bleeding: Secondary | ICD-10-CM

## 2021-08-26 NOTE — Progress Notes (Signed)
Robin Clayton presents for test results follow up. See prior notes. ?No more vaginal bleeding ?EMBX results reviewed with pt.  ?GYN U/S results reviewed with pt. ? ?PE AF VSS ?Lungs clear Heart RRR ?Abd soft + BS ?GU deferred ? ?A/P PMB ?       Endometrial polyp ?       Left ovarian mass ? ?Management options for endometrial polyp reviewed. Following discussion pt desires to follow for now. Pt instructed to call if has anymore episodes of PMB. ?Ovarian mass management reviewed. Low suspious for malignancy. Pt desires to follow for now. Will repeat U/S in 3 months.  ?

## 2021-08-26 NOTE — Patient Instructions (Signed)

## 2021-10-08 ENCOUNTER — Ambulatory Visit (INDEPENDENT_AMBULATORY_CARE_PROVIDER_SITE_OTHER): Payer: Medicaid Other | Admitting: Obstetrics and Gynecology

## 2021-10-08 ENCOUNTER — Encounter: Payer: Self-pay | Admitting: Obstetrics and Gynecology

## 2021-10-08 VITALS — BP 146/76 | HR 96 | Ht 64.0 in | Wt 201.6 lb

## 2021-10-08 DIAGNOSIS — N950 Postmenopausal bleeding: Secondary | ICD-10-CM

## 2021-10-08 DIAGNOSIS — N838 Other noninflammatory disorders of ovary, fallopian tube and broad ligament: Secondary | ICD-10-CM | POA: Diagnosis not present

## 2021-10-08 DIAGNOSIS — R9389 Abnormal findings on diagnostic imaging of other specified body structures: Secondary | ICD-10-CM | POA: Diagnosis not present

## 2021-10-08 NOTE — Progress Notes (Signed)
Pt reports that she urinated Sunday and saw a pinkish discharge. Reports that she has not had any bleeding since and denies pain. Pt was advised to follow up if symptoms occurred.

## 2021-10-08 NOTE — Progress Notes (Signed)
Pt with H/O PMB EMBX negative U/S thicken endometrium Left ovarian mass  Pt noted some pinkest discharge this past Sunday x 1 Nothing since and no pain  PE AF VSS Lungs clear Herat RRR Abd soft + BS  A/P   PMB          Left ovarian mass Pt reassured. Instructed to call for heavy vaginal bleeding. Pt is already scheduled for F/U U/S and appt next month. Advised to keep each. F/U as scheduled

## 2021-11-11 NOTE — Progress Notes (Unsigned)
Cardiology Office Note:    Date:  11/14/2021   ID:  Robin Clayton, DOB 26-Oct-1955, MRN [id number]  PCP:  Robin Limbo, MD   [facility] Providers Cardiologist:  Robin Him, MD     Referring MD: Robin Limbo, MD   Chief Complaint: palpitations  History of Present Illness:    Robin Clayton is a very pleasant 66 y.o. female with a hx of hypertension, bradycardia, morbid obesity, CKD, COPD, OSA intolerant to CPAP, atrial tachycardia, and transient complete heart block.  She has history of palpitations with event monitor in 10/2017 demonstrating atrial tachycardia up to 25 beats, sinus bradycardia to sinus tachycardia (30 ? 130 bpm), transient complete heart block with ventricular standstill for up to 4 seconds.  She was started on diltiazem due to palpitations and referred to EP.  Dr. Caryl Clayton saw her and she was doing better with the medication change.  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  Intolerant to CPAP and was on nocturnal O2.  She could not afford the oral device and refused the inspire device.  Was interested in getting back on Pap so we placed her back on auto settings from 4-18 cm H2o. she could not tolerate Pap and was referred back to Dr. Toy Clayton to request oral device.  Decided she did not want to go to Dr. Toy Clayton.  She was last seen in our office by Dr. Radford Clayton on 09/30/2020.  She reported her PCP told her sleep study revealed she did not need CPAP due to significant weight loss.  Coronary calcium score 484, LDL on 09/18/2020 was 100.  She was advised to increase Lipitor to 40 mg daily and recheck fasting labs in 6 weeks.  LDL improved to 78, but still above goal.  She was advised to increase Lipitor to 80 mg daily and follow-up in 6 months.  Today, she is here alone for follow-up. She reports heart fluttering when lying or sitting, better when she is active. Occurs a few times a week. These feel different than previous palpitations for which she wore a  monitor in 2019. She is concerned about a sharp pain that occurred recently, lasted for about 3 seconds.  She had not felt this type of pain before. It occurred at rest, it is difficult for her to discern if pain occurs with exertion because she can only walk a few feet before stopping because of shortness of breath.  She does not feel that her dyspnea has worsened recently.  No nausea/vomiting, diaphoresis.  She denies lightheadedness, presyncope, syncope.  No bleeding concerns. Is disappointed by recent weight loss and subsequently gained it all back.   Past Medical History:  Diagnosis Date   Anemia    Anxiety    Atrial tachycardia (HCC)    Bradycardia    Chronic bronchitis (HCC)    Chronic lower back pain    CKD (chronic kidney disease), stage III ([place])    Complete heart block ([place])    a. event monitor in 10/2017 demonstrating nonsustained atrial tachycardia up to 25 beats, sinus bradycardia to sinus tachycardia (30->130bpm), transient complete heart block with ventricular standstill for up to 4 seconds.   Constipation    COPD (chronic obstructive pulmonary disease) (HCC)    GERD (gastroesophageal reflux disease)    takes tums occ   Hyperlipidemia    Hypertension    Morbid obesity (HCC)    Obstructive sleep apnea    moderate OSA with AHI 24/hr intolerant to CPAP and could  not afford oral device   Sleep apnea    Type II diabetes mellitus ([place])     Past Surgical History:  Procedure Laterality Date   BREAST BIOPSY  12/06/2011   Procedure: BREAST BIOPSY WITH NEEDLE LOCALIZATION;  Surgeon: Robin Bookbinder, MD;  Location: [place];  Service: General;  Laterality: Left;   CESAREAN SECTION  1970   JOINT REPLACEMENT     REVISION TOTAL HIP ARTHROPLASTY Bilateral     Current Medications: Current Meds  Medication Sig   acetaminophen (TYLENOL) 500 MG tablet Take 1,000 mg by mouth every 6 (six) hours as needed for mild pain.   albuterol (PROVENTIL) (2.5 MG/3ML) 0.083% nebulizer solution  Take 2.5 mg by nebulization every 4 (four) hours as needed (shortness of breath).    albuterol (VENTOLIN HFA) 108 (90 Base) MCG/ACT inhaler Inhale 2 puffs into the lungs every 6 (six) hours as needed for wheezing.   aspirin 81 MG tablet Take 81 mg by mouth daily.   atorvastatin (LIPITOR) 80 MG tablet TAKE 1 TABLET(80 MG) BY MOUTH DAILY (Patient taking differently: Take 80 mg by mouth at bedtime.)   chlorthalidone (HYGROTON) 25 MG tablet Take 25 mg by mouth daily.   cloNIDine (CATAPRES) 0.1 MG tablet Take 0.1 mg by mouth daily. Also takes 0.61m at bedtime   cloNIDine (CATAPRES) 0.2 MG tablet Take 0.2 mg by mouth at bedtime. Also takes 0.167monce daily   diltiazem (CARDIZEM CD) 240 MG 24 hr capsule Take 240 mg by mouth at bedtime.   FEROSUL 325 (65 Fe) MG tablet TAKE 1 TABLET(325 MG) BY MOUTH EVERY OTHER DAY   hydrochlorothiazide (MICROZIDE) 12.5 MG capsule Take 12.5 mg by mouth daily.   metFORMIN (GLUCOPHAGE) 500 MG tablet Take 500 mg by mouth See admin instructions. 50074mn the afternoon and 500m2m bedtime   metoprolol tartrate (LOPRESSOR) 50 MG tablet Take 1 tablet (50 mg total) by mouth as directed. 2 hours prior to CT scan.   montelukast (SINGULAIR) 10 MG tablet TAKE 1 TABLET(10 MG) BY MOUTH DAILY (Patient taking differently: Take 10 mg by mouth daily in the afternoon.)   pantoprazole (PROTONIX) 40 MG tablet Take 40 mg by mouth every morning.   sertraline (ZOLOFT) 25 MG tablet Take 25 mg by mouth daily.    SYMBICORT 80-4.5 MCG/ACT inhaler TAKE 2 PUFFS FIRST THING IN THE AM AND THEN 2 PUFFS 12 HOURS LATER (Patient taking differently: Inhale 2 puffs into the lungs 2 (two) times daily as needed (wheezing/shortness of breath).)   valsartan (DIOVAN) 160 MG tablet Take 160 mg by mouth at bedtime.   Current Facility-Administered Medications for the 11/13/21 encounter (Office Visit) with SwinAnn MakichLanice Clayton  Medication   0.9 %  sodium chloride infusion     Allergies:   Wound dressings    Social History   Socioeconomic History   Marital status: Single    Spouse name: Not on file   Number of children: 7   Years of education: Not on file   Highest education level: Not on file  Occupational History   Occupation: Disabled  Tobacco Use   Smoking status: Former    Packs/day: 0.50    Years: 38.00    Total pack years: 19.00    Types: Cigarettes    Quit date: 07/01/2020    Years since quitting: 1.3   Smokeless tobacco: Never  Vaping Use   Vaping Use: Never used  Substance and Sexual Activity   Alcohol use: No   Drug use:  No   Sexual activity: Not Currently  Other Topics Concern   Not on file  Social History Narrative   Not on file   Social Determinants of Health   Financial Resource Strain: Not on file  Food Insecurity: Not on file  Transportation Needs: Not on file  Physical Activity: Not on file  Stress: Not on file  Social Connections: Not on file     Family History: The patient's family history includes Asthma in her sister; Cancer in her mother; Diabetes in her father and mother; Heart attack in her father; Hyperlipidemia in her mother; Hypertension in her father and mother; Kidney disease in her father and sister; Stomach cancer (age of onset: 56) in her mother. There is no history of Colon cancer, Colon polyps, Esophageal cancer, or Rectal cancer.  ROS:   Please see the history of present illness.    + palpitations + DOE + chest pain All other systems reviewed and are negative.  Labs/Other Studies Reviewed:    The following studies were reviewed today:  Lexiscan myoview 10/17/19  Nuclear stress EF: 63%. The left ventricular ejection fraction is normal (55-65%). There was no ST segment deviation noted during stress. The study is normal. This is a low risk study.   Normal pharmacologic nuclear study with no evidence for prior infarct or ischemia. Normal LVEF.   CT Cardiac Scoring 10/10/19  IMPRESSION: Coronary calcium score of 484. This  was 98th percentile for age and sex matched control. Three vessel coronary artery calcifications.  Echo 11/19/18  1. The left ventricle has hyperdynamic systolic function, with an  ejection fraction of >65%. The cavity size was normal. Left ventricular  diastolic Doppler parameters are consistent with impaired relaxation.  Elevated left ventricular end-diastolic  pressure.   2. The right ventricle has normal systolic function. The cavity was  normal. There is no increase in right ventricular wall thickness.   3. The aortic valve is tricuspid. Mild thickening of the aortic valve.  Mild calcification of the aortic valve. Mild stenosis of the aortic valve.   4. The aortic root, aortic arch and ascending aorta are normal in size  and structure.   Cardiac monitor 10/13/17  Sinus bradycardia, normal sinus rhythm, sinus tachycardia. Heart rate ranged from 30 to 130 bpm. Transient complete heart block with ventricular standstill for up to 4 seconds. Lowest heart rate 30 bpm. This occurred at 6:49 AM Sustained atrial tachycardia up to 25 beats.  Recent Labs: 06/14/2021: Hemoglobin 11.0; Platelets 220 11/13/2021: ALT 13; BUN 20; Creatinine, Ser 1.41; Potassium 4.4; Sodium 139  Recent Lipid Panel    Component Value Date/Time   CHOL 182 11/13/2021 1415   TRIG 116 11/13/2021 1415   HDL 52 11/13/2021 1415   CHOLHDL 3.5 11/13/2021 1415   CHOLHDL 3.2 01/05/2010 0355   VLDL 12 01/05/2010 0355   LDLCALC 109 (H) 11/13/2021 1415     Risk Assessment/Calculations:           Physical Exam:    VS:  BP 112/70 (BP Location: Left Arm, Patient Position: Sitting, Cuff Size: Large)   Pulse 67   Ht 5' 4"  (1.626 m)   Wt 200 lb 12.8 oz (91.1 kg)   LMP 04/30/2012   SpO2 93%   BMI 34.47 kg/m     Wt Readings from Last 3 Encounters:  11/13/21 200 lb 12.8 oz (91.1 kg)  10/08/21 201 lb 9.6 oz (91.4 kg)  08/26/21 201 lb (91.2 kg)     GEN: Well  developed, obese in no acute distress HEENT:  Normal NECK: No JVD; No carotid bruits CARDIAC: RRR, no murmurs, rubs, gallops RESPIRATORY:  Diminished breath sounds bilaterally without rales, wheezing or rhonchi  ABDOMEN: Soft, non-tender, non-distended MUSCULOSKELETAL:  No edema; No deformity. 2+ pedal pulses, equal bilaterally SKIN: Warm and dry NEUROLOGIC:  Alert and oriented x 3 PSYCHIATRIC:  Normal affect   EKG:  EKG is not ordered today.    Diagnoses:    1. Chest pain, unspecified type   2. Hyperlipidemia LDL goal <70   3. Essential hypertension   4. Nonrheumatic aortic valve stenosis   5. Elevated coronary artery calcium score   6. Dyspnea on exertion   7. Chronic obstructive pulmonary disease, unspecified COPD type ([place])    Assessment and Plan:     Chest pain: Recent episode of chest pain and worsening palpitations that are concerning to her as being different from anything she has felt in the past. Due to history of elevated coronary calcium score and history of chronic DOE, we will get a coronary CTA for evaluation of coronary anatomy.   COPD/DOE: She has chronic dyspnea on exertion. She does not feel that it has worsened recently, however it is difficult to evaluate for chest pain with exertion.  Denies orthopnea, PND, edema. No clear evidence of volume overload on exam. Does not exercise on a consistent basis and likely there is a component of deconditioning. Encouraged short walks around her apartment several times per day. Continue to work on weight loss.   Elevated coronary calcium score/hyperlipidemia LDL goal < 70: Coronary calcium score 484, 98th percentile for age/sex.  LDL 78 on 12/31/2020.  We will recheck lipids today.  Continue atorvastatin.  Hypertension: BP is well-controlled. Will recheck kidney function and electrolytes today. No medication changes.   Nonsustained atrial tachycardia: HR is stable and regular today at 67 bpm on exam.  Denies feelings of her heart racing.  As noted above she is having some  palpitations.  She denies lightheadedness, presyncope, syncope.  Would like to get coronary CTA prior to repeating cardiac monitor.  Continue diltiazem.   Aortic valve disease: Mild thickening/mild calcification, mild stenosis by echo on 11/2018.   Disposition: 2 months with Dr. Radford Clayton or APP   Medication Adjustments/Labs and Tests Ordered: Current medicines are reviewed at length with the patient today.  Concerns regarding medicines are outlined above.  Orders Placed This Encounter  Procedures   CT CORONARY MORPH W/CTA COR W/SCORE W/CA W/CM &/OR WO/CM   Lipid Profile   Comp Met (CMET)   Meds ordered this encounter  Medications   metoprolol tartrate (LOPRESSOR) 50 MG tablet    Sig: Take 1 tablet (50 mg total) by mouth as directed. 2 hours prior to CT scan.    Dispense:  1 tablet    Refill:  0    Patient Instructions  Medication Instructions:   Your physician recommends that you continue on your current medications as directed. Please refer to the Current Medication list given to you today.   *If you need a refill on your cardiac medications before your next appointment, please call your pharmacy*   Lab Work:  TODAY!!!!! CMET/LIPID  If you have labs (blood work) drawn today and your tests are completely normal, you will receive your results only by: [place] (if you have MyChart) OR A paper copy in the mail If you have any lab test that is abnormal or we need to change your treatment, we will call  you to review the results.   Testing/Procedures:    Your cardiac CT will be scheduled at one of the below locations:   [facility] [address] [place], [place] [address] [phone]  If scheduled at [facility], please arrive at the [facility] and Children's Entrance (Entrance C2) of [facility] 30 minutes prior to test start time. You can use the FREE valet parking offered at entrance C (encouraged to control the heart rate for the  test)  Proceed to the [facility] Radiology Department (first floor) to check-in and test prep.  All radiology patients and guests should use entrance C2 at [facility], accessed from [facility], even though the hospital's physical address listed is [address]     Please follow these instructions carefully (unless otherwise directed):   On the Night Before the Test: Be sure to Drink plenty of water. Do not consume any caffeinated/decaffeinated beverages or chocolate 12 hours prior to your test. Do not take any antihistamines 12 hours prior to your test.   On the Day of the Test: Drink plenty of water until 1 hour prior to the test. Do not eat any food 4 hours prior to the test. You may take your regular medications prior to the test.  Take metoprolol one tablet ( 50 mg) (Lopressor) two hours prior to test. HOLD Hydrochlorothiazide morning of the test. FEMALES- please wear underwire-free bra if available, avoid dresses & tight clothing      After the Test: Drink plenty of water. After receiving IV contrast, you may experience a mild flushed feeling. This is normal. On occasion, you may experience a mild rash up to 24 hours after the test. This is not dangerous. If this occurs, you can take Benadryl 25 mg and increase your fluid intake. If you experience trouble breathing, this can be serious. If it is severe call 911 IMMEDIATELY. If it is mild, please call our office. If you take any of these medications: Metformin please do not take 48 hours after completing test unless otherwise instructed.  We will call to schedule your test 2-4 weeks out understanding that some insurance companies will need an authorization prior to the service being performed.   For non-scheduling related questions, please contact the cardiac imaging nurse navigator should you have any questions/concerns: Marchia Bond, Cardiac Imaging Nurse Navigator Gordy Clement, Cardiac  Imaging Nurse Navigator [facility] Direct Office Dial: [phone]   For scheduling needs, including cancellations and rescheduling, please call [place], [phone].    Follow-Up: At [facility], you and your health needs are our priority.  As part of our continuing mission to provide you with exceptional heart care, we have created designated Provider Care Teams.  These Care Teams include your primary Cardiologist (physician) and Advanced Practice Providers (APPs -  Physician Assistants and Nurse Practitioners) who all work together to provide you with the care you need, when you need it.  We recommend signing up for the patient portal called "MyChart".  Sign up information is provided on this After Visit Summary.  MyChart is used to connect with patients for Virtual Visits (Telemedicine).  Patients are able to view lab/test results, encounter notes, upcoming appointments, etc.  Non-urgent messages can be sent to your provider as well.   To learn more about what you can do with MyChart, go to [email or web address].    Your next appointment:   2 month(s)  The format for your  next appointment:   In Person  Provider:   Christen Bame, NP        Important Information About Sugar         Signed, Emmaline Life, NP  11/14/2021 8:31 AM    [place]

## 2021-11-13 ENCOUNTER — Encounter: Payer: Self-pay | Admitting: Nurse Practitioner

## 2021-11-13 ENCOUNTER — Ambulatory Visit (INDEPENDENT_AMBULATORY_CARE_PROVIDER_SITE_OTHER): Payer: Medicaid Other | Admitting: Nurse Practitioner

## 2021-11-13 VITALS — BP 112/70 | HR 67 | Ht 64.0 in | Wt 200.8 lb

## 2021-11-13 DIAGNOSIS — I10 Essential (primary) hypertension: Secondary | ICD-10-CM | POA: Diagnosis not present

## 2021-11-13 DIAGNOSIS — I350 Nonrheumatic aortic (valve) stenosis: Secondary | ICD-10-CM

## 2021-11-13 DIAGNOSIS — E785 Hyperlipidemia, unspecified: Secondary | ICD-10-CM | POA: Diagnosis not present

## 2021-11-13 DIAGNOSIS — R079 Chest pain, unspecified: Secondary | ICD-10-CM | POA: Diagnosis not present

## 2021-11-13 DIAGNOSIS — R0609 Other forms of dyspnea: Secondary | ICD-10-CM

## 2021-11-13 DIAGNOSIS — R931 Abnormal findings on diagnostic imaging of heart and coronary circulation: Secondary | ICD-10-CM

## 2021-11-13 DIAGNOSIS — J449 Chronic obstructive pulmonary disease, unspecified: Secondary | ICD-10-CM

## 2021-11-13 DIAGNOSIS — I208 Other forms of angina pectoris: Secondary | ICD-10-CM

## 2021-11-13 DIAGNOSIS — E7800 Pure hypercholesterolemia, unspecified: Secondary | ICD-10-CM

## 2021-11-13 MED ORDER — METOPROLOL TARTRATE 50 MG PO TABS: 50.0000 mg | ORAL_TABLET | ORAL | 0 refills | Status: DC

## 2021-11-13 NOTE — Patient Instructions (Signed)
Medication Instructions:   Your physician recommends that you continue on your current medications as directed. Please refer to the Current Medication list given to you today.   *If you need a refill on your cardiac medications before your next appointment, please call your pharmacy*   Lab Work:  TODAY!!!!! CMET/LIPID  If you have labs (blood work) drawn today and your tests are completely normal, you will receive your results only by: [place] (if you have MyChart) OR A paper copy in the mail If you have any lab test that is abnormal or we need to change your treatment, we will call you to review the results.   Testing/Procedures:    Your cardiac CT will be scheduled at one of the below locations:   [facility] [address] [place], [place] [address] [phone]  If scheduled at [facility], please arrive at the [facility] and Children's Entrance (Entrance C2) of [facility] 30 minutes prior to test start time. You can use the FREE valet parking offered at entrance C (encouraged to control the heart rate for the test)  Proceed to the [facility] Radiology Department (first floor) to check-in and test prep.  All radiology patients and guests should use entrance C2 at [facility], accessed from [facility], even though the hospital's physical address listed is [address].     Please follow these instructions carefully (unless otherwise directed):   On the Night Before the Test: Be sure to Drink plenty of water. Do not consume any caffeinated/decaffeinated beverages or chocolate 12 hours prior to your test. Do not take any antihistamines 12 hours prior to your test.   On the Day of the Test: Drink plenty of water until 1 hour prior to the test. Do not eat any food 4 hours prior to the test. You may take your regular medications prior to the test.  Take metoprolol one tablet ( 50 mg) (Lopressor) two  hours prior to test. HOLD Hydrochlorothiazide morning of the test. FEMALES- please wear underwire-free bra if available, avoid dresses & tight clothing      After the Test: Drink plenty of water. After receiving IV contrast, you may experience a mild flushed feeling. This is normal. On occasion, you may experience a mild rash up to 24 hours after the test. This is not dangerous. If this occurs, you can take Benadryl 25 mg and increase your fluid intake. If you experience trouble breathing, this can be serious. If it is severe call 911 IMMEDIATELY. If it is mild, please call our office. If you take any of these medications: Metformin please do not take 48 hours after completing test unless otherwise instructed.  We will call to schedule your test 2-4 weeks out understanding that some insurance companies will need an authorization prior to the service being performed.   For non-scheduling related questions, please contact the cardiac imaging nurse navigator should you have any questions/concerns: Marchia Bond, Cardiac Imaging Nurse Navigator Gordy Clement, Cardiac Imaging Nurse Navigator [place] [facility] Direct Office Dial: [phone]   For scheduling needs, including cancellations and rescheduling, please call [place], [phone].    Follow-Up: At [facility], you and your health needs are our priority.  As part of our continuing mission to provide you with exceptional heart care, we have created designated Provider Care Teams.  These Care Teams include your primary Cardiologist (physician) and Advanced Practice Providers (APPs -  Physician Assistants and  Nurse Practitioners) who all work together to provide you with the care you need, when you need it.  We recommend signing up for the patient portal called "MyChart".  Sign up information is provided on this After Visit Summary.  MyChart is used to connect with patients for Virtual Visits (Telemedicine).   Patients are able to view lab/test results, encounter notes, upcoming appointments, etc.  Non-urgent messages can be sent to your provider as well.   To learn more about what you can do with MyChart, go to [email or web address].    Your next appointment:   2 month(s)  The format for your next appointment:   In Person  Provider:   Christen Bame, NP        Important Information About Sugar

## 2021-11-14 ENCOUNTER — Encounter: Payer: Self-pay | Admitting: Nurse Practitioner

## 2021-11-14 LAB — COMPREHENSIVE METABOLIC PANEL
ALT: 13 IU/L (ref 0–32)
AST: 11 IU/L (ref 0–40)
Albumin/Globulin Ratio: 1.8 (ref 1.2–2.2)
Albumin: 4.7 g/dL (ref 3.9–4.9)
Alkaline Phosphatase: 67 IU/L (ref 44–121)
BUN/Creatinine Ratio: 14 (ref 12–28)
BUN: 20 mg/dL (ref 8–27)
Bilirubin Total: 0.3 mg/dL (ref 0.0–1.2)
CO2: 23 mmol/L (ref 20–29)
Calcium: 9.8 mg/dL (ref 8.7–10.3)
Chloride: 102 mmol/L (ref 96–106)
Creatinine, Ser: 1.41 mg/dL — ABNORMAL HIGH (ref 0.57–1.00)
Globulin, Total: 2.6 g/dL (ref 1.5–4.5)
Glucose: 128 mg/dL — ABNORMAL HIGH (ref 70–99)
Potassium: 4.4 mmol/L (ref 3.5–5.2)
Sodium: 139 mmol/L (ref 134–144)
Total Protein: 7.3 g/dL (ref 6.0–8.5)
eGFR: 42 mL/min/{1.73_m2} — ABNORMAL LOW (ref >59)

## 2021-11-14 LAB — LIPID PANEL
Chol/HDL Ratio: 3.5 ratio (ref 0.0–4.4)
Cholesterol, Total: 182 mg/dL (ref 100–199)
HDL: 52 mg/dL (ref >39)
LDL Chol Calc (NIH): 109 mg/dL — ABNORMAL HIGH (ref 0–99)
Triglycerides: 116 mg/dL (ref 0–149)
VLDL Cholesterol Cal: 21 mg/dL (ref 5–40)

## 2021-11-16 ENCOUNTER — Other Ambulatory Visit: Payer: Self-pay | Admitting: *Deleted

## 2021-11-16 MED ORDER — EZETIMIBE 10 MG PO TABS: 10.0000 mg | ORAL_TABLET | Freq: Every day | ORAL | 3 refills | Status: DC

## 2021-11-23 ENCOUNTER — Telehealth: Payer: Self-pay | Admitting: Emergency Medicine

## 2021-11-23 NOTE — Telephone Encounter (Signed)
TC from patient who has questions about metoprolol medication priop to CT scan. Pt canceled appropriately per order.

## 2021-11-24 ENCOUNTER — Other Ambulatory Visit: Payer: Self-pay | Admitting: Nurse Practitioner

## 2021-11-25 ENCOUNTER — Ambulatory Visit (HOSPITAL_BASED_OUTPATIENT_CLINIC_OR_DEPARTMENT_OTHER)
Admission: RE | Admit: 2021-11-25 | Discharge: 2021-11-25 | Disposition: A | Discharge status: Home / Self Care | Payer: Medicaid Other | Source: Ambulatory Visit | Attending: Obstetrics and Gynecology | Admitting: Obstetrics and Gynecology

## 2021-11-25 DIAGNOSIS — N838 Other noninflammatory disorders of ovary, fallopian tube and broad ligament: Secondary | ICD-10-CM | POA: Insufficient documentation

## 2021-11-26 ENCOUNTER — Encounter: Payer: Self-pay | Admitting: Obstetrics and Gynecology

## 2021-11-26 ENCOUNTER — Ambulatory Visit (INDEPENDENT_AMBULATORY_CARE_PROVIDER_SITE_OTHER): Payer: Medicaid Other | Admitting: Obstetrics and Gynecology

## 2021-11-26 VITALS — BP 133/71 | HR 65 | Ht 64.0 in | Wt 202.0 lb

## 2021-11-26 DIAGNOSIS — N838 Other noninflammatory disorders of ovary, fallopian tube and broad ligament: Secondary | ICD-10-CM | POA: Diagnosis not present

## 2021-11-26 DIAGNOSIS — N950 Postmenopausal bleeding: Secondary | ICD-10-CM | POA: Diagnosis not present

## 2021-11-26 NOTE — Progress Notes (Signed)
Ms Kievit presents for GYN U/S f/u. Left ovarian mass identified this past March Repeat yesterday no change. Ca 125 normal in April. Pt has no GYN complaints today No vaginal bleeding  PE AF VSS Lungs clear Herat RRR Abd soft + BS  A/P Left ovarian mass  U/S results reviewed with pt. Low suspicion for malignancy as no change in U/S and negative Ca 125. Discussed conservative management with pt. Pt is agreeable. Will repeat GYN U/S in 6 months.

## 2021-11-26 NOTE — Progress Notes (Signed)
66 y.o GYN presents for [place] results.

## 2021-11-30 ENCOUNTER — Other Ambulatory Visit: Payer: Self-pay | Admitting: Cardiology

## 2021-12-02 ENCOUNTER — Telehealth (HOSPITAL_COMMUNITY): Payer: Self-pay | Admitting: *Deleted

## 2021-12-02 NOTE — Telephone Encounter (Signed)
Reaching out to patient to offer assistance regarding upcoming cardiac imaging study; pt verbalizes understanding of appt date/time, parking situation and where to check in, pre-test NPO status and medications ordered, and verified current allergies; name and call back number provided for further questions should they arise  Gordy Clement RN Navigator Cardiac Imaging [facility] [phone] office [phone] cell  Patient to take '25mg'$  metoprolol tartrate two hours prior to her cardiac CT scan. She is aware to arrive at 12:30pm.

## 2021-12-03 ENCOUNTER — Ambulatory Visit (HOSPITAL_COMMUNITY)
Admission: RE | Admit: 2021-12-03 | Discharge: 2021-12-03 | Disposition: A | Discharge status: Home / Self Care | Payer: Medicaid Other | Source: Ambulatory Visit | Attending: Nurse Practitioner | Admitting: Nurse Practitioner

## 2021-12-03 ENCOUNTER — Other Ambulatory Visit: Payer: Self-pay | Admitting: Cardiovascular Disease

## 2021-12-03 DIAGNOSIS — I2510 Atherosclerotic heart disease of native coronary artery without angina pectoris: Secondary | ICD-10-CM

## 2021-12-03 DIAGNOSIS — R079 Chest pain, unspecified: Secondary | ICD-10-CM | POA: Insufficient documentation

## 2021-12-03 MED ORDER — NITROGLYCERIN 0.4 MG SL SUBL
SUBLINGUAL_TABLET | SUBLINGUAL | Status: AC
  Filled 2021-12-03: qty 2

## 2021-12-03 MED ORDER — IOHEXOL 350 MG/ML SOLN
100.0000 mL | Freq: Once | INTRAVENOUS | Status: AC | PRN
  Administered 2021-12-03: 100 mL via INTRAVENOUS

## 2021-12-03 MED ORDER — NITROGLYCERIN 0.4 MG SL SUBL
0.8000 mg | SUBLINGUAL_TABLET | Freq: Once | SUBLINGUAL | Status: AC
  Administered 2021-12-03: 0.8 mg via SUBLINGUAL

## 2021-12-04 ENCOUNTER — Ambulatory Visit (HOSPITAL_BASED_OUTPATIENT_CLINIC_OR_DEPARTMENT_OTHER)
Admission: RE | Admit: 2021-12-04 | Discharge: 2021-12-04 | Disposition: A | Discharge status: Home / Self Care | Payer: Medicaid Other | Source: Ambulatory Visit | Attending: Cardiovascular Disease | Admitting: Cardiovascular Disease

## 2021-12-04 ENCOUNTER — Ambulatory Visit (HOSPITAL_COMMUNITY)
Admission: RE | Admit: 2021-12-04 | Discharge: 2021-12-04 | Disposition: A | Discharge status: Home / Self Care | Payer: Medicaid Other | Source: Ambulatory Visit | Attending: Cardiovascular Disease | Admitting: Cardiovascular Disease

## 2021-12-04 DIAGNOSIS — I2510 Atherosclerotic heart disease of native coronary artery without angina pectoris: Secondary | ICD-10-CM | POA: Diagnosis not present

## 2021-12-04 DIAGNOSIS — R079 Chest pain, unspecified: Secondary | ICD-10-CM | POA: Diagnosis not present

## 2021-12-07 ENCOUNTER — Ambulatory Visit: Payer: Medicaid Other | Admitting: Cardiology

## 2022-01-01 ENCOUNTER — Telehealth: Payer: Self-pay | Admitting: *Deleted

## 2022-01-01 DIAGNOSIS — Z006 Encounter for examination for normal comparison and control in clinical research program: Secondary | ICD-10-CM

## 2022-01-01 NOTE — Telephone Encounter (Signed)
I called patient to discuss Victorion1-Prevent Study. We will bring a copy of the consent to the patient at her office visit on Sept 19,2023. The patient stated she wanted to see if Zetia helped lower the LDL.Patient stated might be interested in the study.

## 2022-01-10 NOTE — Progress Notes (Signed)
Cardiology Office Note:    Date:  01/18/2022   ID:  Robin Clayton, DOB 04-20-1956, MRN [id number]  PCP:  Bernerd Limbo, MD   [facility] Providers Cardiologist:  Fransico Him, MD     Referring MD: Bernerd Limbo, MD   Chief Complaint: palpitations  History of Present Illness:    Robin Clayton is a very pleasant 66 y.o. female with a hx of hypertension, bradycardia, morbid obesity, CKD, COPD, OSA intolerant to CPAP, atrial tachycardia, and transient complete heart block.  She has history of palpitations with event monitor in 10/2017 demonstrating atrial tachycardia up to 25 beats, sinus bradycardia to sinus tachycardia (30 ? 130 bpm), transient complete heart block with ventricular standstill for up to 4 seconds.  She was started on diltiazem due to palpitations and referred to EP.  Dr. Caryl Comes saw her and she was doing better with the medication change.  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  Intolerant to CPAP and was on nocturnal O2.  She could not afford the oral device and refused the inspire device.  Was interested in getting back on Pap so we placed her back on auto settings from 4-18 cm H2o. she could not tolerate Pap and was referred back to Dr. Toy Cookey to request oral device.  Decided she did not want to go to Dr. Toy Cookey.  She was last seen in our office by Dr. Radford Pax on 09/30/2020.  She reported her PCP told her sleep study revealed she did not need CPAP due to significant weight loss.  Coronary calcium score 484, LDL on 09/18/2020 was 100.  She was advised to increase Lipitor to 40 mg daily and recheck fasting labs in 6 weeks.  LDL improved to 78, but still above goal.  She was advised to increase Lipitor to 80 mg daily and follow-up in 6 months.  Her last office visit was with me on 11/13/21 at which time she reported heart fluttering when lying or sitting, better when active. Occurring a few times a week. Different than previous palpitations for which she wore  a monitor in 2019. Concerned about a sharp pain that occurred recently, lasted for about 3 seconds.  She had not felt this type of pain before. It occurred at rest, difficult for her to discern if pain occurs with exertion because she can only walk a few feet before stopping because of shortness of breath. Does not feel that her dyspnea has worsened recently.  No nausea/vomiting, diaphoresis.  She denied lightheadedness, presyncope, syncope.  No bleeding concerns. Is disappointed by recent weight loss and subsequently gained it all back. Coronary CTA was ordered which revealed 4 of 792, 98th percentile age/sex, coronary origin with right dominance, severe (greater than 70%) calcified plaque in the distal LCx, moderate (50 to 69%) plaque in distal RCA, and minimal (less than 25%) plaque in the LAD, aortic atherosclerosis.  No significant blockage noted on FFR analysis.  Today, she is here alone for follow up. Reports she is feeling well. DOE is stable, maybe a little worse than a year ago. Sees pulmonology next week. Is walking for exercise 20-25 minutes daily without symptoms of chest pain or significant DOE. Pays careful attention to her symptoms, will notify us if activity tolerance worsens in light of recent CTA findings. She denies chest pain, lower extremity edema, fatigue, palpitations, melena, hematuria, hemoptysis, diaphoresis, weakness, presyncope, syncope, orthopnea, and PND.  Past Medical History:  Diagnosis Date   Anemia    Anxiety  Atrial tachycardia (HCC)    Bradycardia    Chronic bronchitis (HCC)    Chronic lower back pain    CKD (chronic kidney disease), stage III ([place])    Complete heart block ([place])    a. event monitor in 10/2017 demonstrating nonsustained atrial tachycardia up to 25 beats, sinus bradycardia to sinus tachycardia (30->130bpm), transient complete heart block with ventricular standstill for up to 4 seconds.   Constipation    COPD (chronic obstructive pulmonary disease)  (HCC)    GERD (gastroesophageal reflux disease)    takes tums occ   Hyperlipidemia    Hypertension    Morbid obesity (HCC)    Obstructive sleep apnea    moderate OSA with AHI 24/hr intolerant to CPAP and could not afford oral device   Sleep apnea    Type II diabetes mellitus ([place])     Past Surgical History:  Procedure Laterality Date   BREAST BIOPSY  12/06/2011   Procedure: BREAST BIOPSY WITH NEEDLE LOCALIZATION;  Surgeon: Rolm Bookbinder, MD;  Location: [place];  Service: General;  Laterality: Left;   CESAREAN SECTION  1970   JOINT REPLACEMENT     REVISION TOTAL HIP ARTHROPLASTY Bilateral     Current Medications: Current Meds  Medication Sig   acetaminophen (TYLENOL) 500 MG tablet Take 1,000 mg by mouth every 6 (six) hours as needed for mild pain.   albuterol (PROVENTIL) (2.5 MG/3ML) 0.083% nebulizer solution Take 2.5 mg by nebulization every 4 (four) hours as needed (shortness of breath).    albuterol (VENTOLIN HFA) 108 (90 Base) MCG/ACT inhaler Inhale 2 puffs into the lungs every 6 (six) hours as needed for wheezing.   aspirin 81 MG tablet Take 81 mg by mouth daily.   atorvastatin (LIPITOR) 80 MG tablet TAKE 1 TABLET(80 MG) BY MOUTH DAILY   chlorhexidine (PERIDEX) 0.12 % solution SMARTSIG:By Mouth   chlorthalidone (HYGROTON) 25 MG tablet Take 25 mg by mouth daily.   cloNIDine (CATAPRES) 0.1 MG tablet Take 0.1 mg by mouth daily. Also takes 0.28m at bedtime   cloNIDine (CATAPRES) 0.2 MG tablet Take 0.2 mg by mouth at bedtime. Also takes 0.169monce daily   diltiazem (CARDIZEM CD) 240 MG 24 hr capsule Take 240 mg by mouth at bedtime.   ezetimibe (ZETIA) 10 MG tablet Take 1 tablet (10 mg total) by mouth daily.   FEROSUL 325 (65 Fe) MG tablet TAKE 1 TABLET(325 MG) BY MOUTH EVERY OTHER DAY   hydrochlorothiazide (MICROZIDE) 12.5 MG capsule Take 12.5 mg by mouth daily.   metFORMIN (GLUCOPHAGE) 500 MG tablet Take 500 mg by mouth See admin instructions. 50038mn the afternoon and 500m51mt bedtime   montelukast (SINGULAIR) 10 MG tablet TAKE 1 TABLET(10 MG) BY MOUTH DAILY   pantoprazole (PROTONIX) 40 MG tablet Take 40 mg by mouth every morning.   sertraline (ZOLOFT) 25 MG tablet Take 25 mg by mouth daily.    SYMBICORT 80-4.5 MCG/ACT inhaler TAKE 2 PUFFS FIRST THING IN THE AM AND THEN 2 PUFFS 12 HOURS LATER (Patient taking differently: Inhale 2 puffs into the lungs 2 (two) times daily as needed (wheezing/shortness of breath).)   valsartan (DIOVAN) 160 MG tablet Take 160 mg by mouth at bedtime.     Allergies:   Wound dressings   Social History   Socioeconomic History   Marital status: Single    Spouse name: Not on file   Number of children: 7   Years of education: Not on file   Highest education level:  Not on file  Occupational History   Occupation: Disabled  Tobacco Use   Smoking status: Former    Packs/day: 0.50    Years: 38.00    Total pack years: 19.00    Types: Cigarettes    Quit date: 07/01/2020    Years since quitting: 1.5   Smokeless tobacco: Never  Vaping Use   Vaping Use: Never used  Substance and Sexual Activity   Alcohol use: No   Drug use: No   Sexual activity: Not Currently  Other Topics Concern   Not on file  Social History Narrative   Not on file   Social Determinants of Health   Financial Resource Strain: Not on file  Food Insecurity: Not on file  Transportation Needs: Not on file  Physical Activity: Not on file  Stress: Not on file  Social Connections: Not on file     Family History: The patient's family history includes Asthma in her sister; Cancer in her mother; Diabetes in her father and mother; Heart attack in her father; Hyperlipidemia in her mother; Hypertension in her father and mother; Kidney disease in her father and sister; Stomach cancer (age of onset: 61) in her mother. There is no history of Colon cancer, Colon polyps, Esophageal cancer, or Rectal cancer.  ROS:   Please see the history of present illness.    + DOE All  other systems reviewed and are negative.  Labs/Other Studies Reviewed:    The following studies were reviewed today:  CCTA 12/03/21  1. Coronary calcium score of 792. This was 98th percentile for age-, race-, and sex-matched controls.   2. Normal coronary origin with right dominance.   3. There is severe (>70%) calcified plaque in the distal LCX, moderate (50-69%) plaque in the distal RCA, and minimal (<25%) plaque in the LAD. CAD-RADS 4.   4.  Aortic atherosclerosis.   5.  Will send for FFRct.   1. Left Main: FFRct 0.99   2. LAD: FFRct 0.97 proximal, 0.93 mid, 0.87 distal   3. LCX: FFRct 0.98 proximal, 0.95 mid, 0.79 distal   4. RCA: FFRct 0.99 proximal, 0.91 mid, 0.81 distal   IMPRESSION: Findings are consistent with non-obstructive coronary artery disease.   Recommend aggressive risk factor management, including LDL goal <70.   Lexiscan myoview 10/17/19  Nuclear stress EF: 63%. The left ventricular ejection fraction is normal (55-65%). There was no ST segment deviation noted during stress. The study is normal. This is a low risk study.   Normal pharmacologic nuclear study with no evidence for prior infarct or ischemia. Normal LVEF.   CT Cardiac Scoring 10/10/19  IMPRESSION: Coronary calcium score of 484. This was 98th percentile for age and sex matched control. Three vessel coronary artery calcifications.  Echo 11/19/18  1. The left ventricle has hyperdynamic systolic function, with an  ejection fraction of >65%. The cavity size was normal. Left ventricular  diastolic Doppler parameters are consistent with impaired relaxation.  Elevated left ventricular end-diastolic  pressure.   2. The right ventricle has normal systolic function. The cavity was  normal. There is no increase in right ventricular wall thickness.   3. The aortic valve is tricuspid. Mild thickening of the aortic valve.  Mild calcification of the aortic valve. Mild stenosis of the aortic  valve.   4. The aortic root, aortic arch and ascending aorta are normal in size  and structure.   Cardiac monitor 10/13/17  Sinus bradycardia, normal sinus rhythm, sinus tachycardia. Heart rate ranged from  30 to 130 bpm. Transient complete heart block with ventricular standstill for up to 4 seconds. Lowest heart rate 30 bpm. This occurred at 6:49 AM Sustained atrial tachycardia up to 25 beats.  Recent Labs: 06/14/2021: Hemoglobin 11.0; Platelets 220 11/13/2021: ALT 13; BUN 20; Creatinine, Ser 1.41; Potassium 4.4; Sodium 139  Recent Lipid Panel    Component Value Date/Time   CHOL 182 11/13/2021 1415   TRIG 116 11/13/2021 1415   HDL 52 11/13/2021 1415   CHOLHDL 3.5 11/13/2021 1415   CHOLHDL 3.2 01/05/2010 0355   VLDL 12 01/05/2010 0355   LDLCALC 109 (H) 11/13/2021 1415     Risk Assessment/Calculations:      Physical Exam:    VS:  BP 124/62   Pulse 61   Ht 5' 4"  (1.626 m)   Wt 205 lb (93 kg)   LMP 04/30/2012   SpO2 98%   BMI 35.19 kg/m     Wt Readings from Last 3 Encounters:  01/18/22 205 lb (93 kg)  11/26/21 202 lb (91.6 kg)  11/13/21 200 lb 12.8 oz (91.1 kg)     GEN: Well developed, obese in no acute distress HEENT: Normal NECK: No JVD; No carotid bruits CARDIAC: RRR, no murmurs, rubs, gallops RESPIRATORY:  Diminished breath sounds bilaterally without rales, wheezing or rhonchi  ABDOMEN: Soft, non-tender, non-distended MUSCULOSKELETAL:  No edema; No deformity. 2+ pedal pulses, equal bilaterally SKIN: Warm and dry NEUROLOGIC:  Alert and oriented x 3 PSYCHIATRIC:  Normal affect   EKG:  EKG is not ordered today  Diagnoses:    1. Coronary artery disease involving native coronary artery of native heart without angina pectoris   2. Hyperlipidemia LDL goal <70   3. Essential hypertension   4. Chronic obstructive pulmonary disease, unspecified COPD type ([place])   5. Dyspnea on exertion   6. Aortic valve disorder   7. Nonrheumatic aortic valve stenosis   8. Atrial  tachycardia (HCC)     Assessment and Plan:     CAD without angina: Elevated coronary calcium score of 792, 98th percentile for age/race/sex.  Normal coronary origin with right dominance.  Severe (>70%) calcified plaque in the distal LCx, moderate (50 to 69%) plaque in the distal RCA, and minimal (less than 25%) plaque in the LAD. Sent for FFR which revealed nonobstructive coronary artery disease with recommendation for aggressive risk factor management. She denies chest pain. Has stable chronic dyspnea. Has increased walking for exercise with no activity intolerance. No indication for further ischemic evaluation at this time. Advised her to notify of [place] concerning symptoms in the future.  Continue atorvastatin, Zetia, aspirin, valsartan.   COPD/DOE: She has chronic dyspnea on exertion. She does not feel that it has worsened recently. Denies orthopnea, PND, edema. No clear evidence of volume overload on exam. Has started walking for exercise, walking 20-25 minutes daily.  Continue to work on weight loss. Management of COPD per pulmonology.   Hyperlipidemia LDL goal < 70: Coronary calcium score 792, 98th percentile for age/sex.  LDL 109 on 11/13/21. Zetia was added to high dose statin therapy. We will recheck lipids today. She is interested in referral to Lipid clinic if LDL still not at goal.   Hypertension: BP is well-controlled. Home BP highest 158/78, mostly gets systolic readings [id number] mmHg.  No medication changes today.   Nonsustained atrial tachycardia: Quiescent at this time. Occasional palpitations.  She denies lightheadedness, presyncope, syncope. Continue diltiazem.   Aortic valve disease: Mild thickening/mild calcification, mild stenosis by echo on  11/2018. Normal size aorta with ascending aorta 3.0 cm, aortic atherosclerosis on CTA 12/03/21.   Disposition: 6 months with Dr. Radford Pax or APP   Medication Adjustments/Labs and Tests Ordered: Current medicines are reviewed at length with  the patient today.  Concerns regarding medicines are outlined above.  Orders Placed This Encounter  Procedures   Lipid Profile   Comp Met (CMET)   No orders of the defined types were placed in this encounter.   Patient Instructions  Medication Instructions:   Your physician recommends that you continue on your current medications as directed. Please refer to the Current Medication list given to you today.   *If you need a refill on your cardiac medications before your next appointment, please call your pharmacy*   Lab Work:  TODAY!!!! CMET/LIPID  If you have labs (blood work) drawn today and your tests are completely normal, you will receive your results only by: Lead (if you have MyChart) OR A paper copy in the mail If you have any lab test that is abnormal or we need to change your treatment, we will call you to review the results.   Follow-Up: At [facility], you and your health needs are our priority.  As part of our continuing mission to provide you with exceptional heart care, we have created designated Provider Care Teams.  These Care Teams include your primary Cardiologist (physician) and Advanced Practice Providers (APPs -  Physician Assistants and Nurse Practitioners) who all work together to provide you with the care you need, when you need it.  We recommend signing up for the patient portal called "MyChart".  Sign up information is provided on this After Visit Summary.  MyChart is used to connect with patients for Virtual Visits (Telemedicine).  Patients are able to view lab/test results, encounter notes, upcoming appointments, etc.  Non-urgent messages can be sent to your provider as well.   To learn more about what you can do with MyChart, go to [email or web address].    Your next appointment:   6 month(s)  The format for your next appointment:   In Person  Provider:   Fransico Him, MD     [place] refers  to food and lifestyle choices that are based on the traditions of countries located on the [place]. It focuses on eating more fruits, vegetables, whole grains, beans, nuts, seeds, and heart-healthy fats, and eating less dairy, meat, eggs, and processed foods with added sugar, salt, and fat. This way of eating has been shown to help prevent certain conditions and improve outcomes for people who have chronic diseases, like kidney disease and heart disease. What are tips for following this plan? Reading food labels Check the serving size of packaged foods. For foods such as rice and pasta, the serving size refers to the amount of cooked product, not dry. Check the total fat in packaged foods. Avoid foods that have saturated fat or trans fats. Check the ingredient list for added sugars, such as corn syrup. Shopping  Buy a variety of foods that offer a balanced diet, including: Fresh fruits and vegetables (produce). Grains, beans, nuts, and seeds. Some of these may be available in unpackaged forms or large amounts (in bulk). Fresh seafood. Poultry and eggs. Low-fat dairy products. Buy whole ingredients instead of prepackaged foods. Buy fresh fruits and vegetables in-season from local farmers markets. Buy plain frozen fruits and vegetables. If you do not have access to quality fresh seafood, buy precooked frozen shrimp  or canned fish, such as tuna, salmon, or sardines. Stock your pantry so you always have certain foods on hand, such as olive oil, canned tuna, canned tomatoes, rice, pasta, and beans. Cooking Cook foods with extra-virgin olive oil instead of using butter or other vegetable oils. Have meat as a side dish, and have vegetables or grains as your main dish. This means having meat in small portions or adding small amounts of meat to foods like pasta or stew. Use beans or vegetables instead of meat in common dishes like chili or lasagna. Experiment with different cooking methods.  Try roasting, broiling, steaming, and sauting vegetables. Add frozen vegetables to soups, stews, pasta, or rice. Add nuts or seeds for added healthy fats and plant protein at each meal. You can add these to yogurt, salads, or vegetable dishes. Marinate fish or vegetables using olive oil, lemon juice, garlic, and fresh herbs. Meal planning Plan to eat one vegetarian meal one day each week. Try to work up to two vegetarian meals, if possible. Eat seafood two or more times a week. Have healthy snacks readily available, such as: Vegetable sticks with hummus. Greek yogurt. Fruit and nut trail mix. Eat balanced meals throughout the week. This includes: Fruit: 2-3 servings a day. Vegetables: 4-5 servings a day. Low-fat dairy: 2 servings a day. Fish, poultry, or lean meat: 1 serving a day. Beans and legumes: 2 or more servings a week. Nuts and seeds: 1-2 servings a day. Whole grains: 6-8 servings a day. Extra-virgin olive oil: 3-4 servings a day. Limit red meat and sweets to only a few servings a month. Lifestyle  Cook and eat meals together with your family, when possible. Drink enough fluid to keep your urine pale yellow. Be physically active every day. This includes: Aerobic exercise like running or swimming. Leisure activities like gardening, walking, or housework. Get 7-8 hours of sleep each night. If recommended by your health care provider, drink red wine in moderation. This means 1 glass a day for nonpregnant women and 2 glasses a day for men. A glass of wine equals 5 oz (150 mL). What foods should I eat? Fruits Apples. Apricots. Avocado. Berries. Bananas. Cherries. Dates. Figs. Grapes. Lemons. Melon. Oranges. Peaches. Plums. Pomegranate. Vegetables Artichokes. Beets. Broccoli. Cabbage. Carrots. Eggplant. Green beans. Chard. Kale. Spinach. Onions. Leeks. Peas. Squash. Tomatoes. Peppers. Radishes. Grains Whole-grain pasta. Brown rice. Bulgur wheat. Polenta. Couscous. Whole-wheat  bread. [place]. Meats and other proteins Beans. Almonds. Sunflower seeds. Pine nuts. Peanuts. [place]. Salmon. Scallops. Shrimp. [place]. Tilapia. Clams. Oysters. Eggs. Poultry without skin. Dairy Low-fat milk. Cheese. Greek yogurt. Fats and oils Extra-virgin olive oil. Avocado oil. Grapeseed oil. Beverages Water. Red wine. Herbal tea. Sweets and desserts Greek yogurt with honey. Baked apples. Poached pears. Trail mix. Seasonings and condiments Basil. Cilantro. Coriander. Cumin. Mint. Parsley. Sage. Rosemary. Tarragon. Garlic. Oregano. Thyme. Pepper. Balsamic vinegar. Tahini. Hummus. Tomato sauce. Olives. Mushrooms. The items listed above may not be a complete list of foods and beverages you can eat. Contact a dietitian for more information. What foods should I limit? This is a list of foods that should be eaten rarely or only on special occasions. Fruits Fruit canned in syrup. Vegetables Deep-fried potatoes (french fries). Grains Prepackaged pasta or rice dishes. Prepackaged cereal with added sugar. Prepackaged snacks with added sugar. Meats and other proteins Beef. Pork. Lamb. Poultry with skin. Hot dogs. Berniece Salines. Dairy Ice cream. Sour cream. Whole milk. Fats and oils Butter. Canola oil. Vegetable oil. Beef fat (tallow). Lard. Beverages  Juice. Sugar-sweetened soft drinks. Beer. Liquor and spirits. Sweets and desserts Cookies. Cakes. Pies. Candy. Seasonings and condiments Mayonnaise. Pre-made sauces and marinades. The items listed above may not be a complete list of foods and beverages you should limit. Contact a dietitian for more information. Summary The Mediterranean diet includes both food and lifestyle choices. Eat a variety of fresh fruits and vegetables, beans, nuts, seeds, and whole grains. Limit the amount of red meat and sweets that you eat. If recommended by your health care provider, drink red wine in moderation. This means 1 glass a day for nonpregnant women and 2  glasses a day for men. A glass of wine equals 5 oz (150 mL). This information is not intended to replace advice given to you by your health care provider. Make sure you discuss any questions you have with your health care provider. Document Revised: 05/25/2019 Document Reviewed: 03/22/2019 Elsevier Patient Education  [place]         Signed, Emmaline Life, NP  01/18/2022 5:15 PM    [place]

## 2022-01-18 ENCOUNTER — Encounter: Payer: Self-pay | Admitting: Nurse Practitioner

## 2022-01-18 ENCOUNTER — Ambulatory Visit: Payer: Medicaid Other | Attending: Nurse Practitioner | Admitting: Nurse Practitioner

## 2022-01-18 VITALS — BP 124/62 | HR 61 | Ht 64.0 in | Wt 205.0 lb

## 2022-01-18 DIAGNOSIS — R0609 Other forms of dyspnea: Secondary | ICD-10-CM

## 2022-01-18 DIAGNOSIS — I10 Essential (primary) hypertension: Secondary | ICD-10-CM

## 2022-01-18 DIAGNOSIS — I471 Supraventricular tachycardia: Secondary | ICD-10-CM

## 2022-01-18 DIAGNOSIS — I359 Nonrheumatic aortic valve disorder, unspecified: Secondary | ICD-10-CM

## 2022-01-18 DIAGNOSIS — I2510 Atherosclerotic heart disease of native coronary artery without angina pectoris: Secondary | ICD-10-CM | POA: Diagnosis not present

## 2022-01-18 DIAGNOSIS — I350 Nonrheumatic aortic (valve) stenosis: Secondary | ICD-10-CM

## 2022-01-18 DIAGNOSIS — E785 Hyperlipidemia, unspecified: Secondary | ICD-10-CM | POA: Diagnosis not present

## 2022-01-18 DIAGNOSIS — J449 Chronic obstructive pulmonary disease, unspecified: Secondary | ICD-10-CM

## 2022-01-18 NOTE — Patient Instructions (Signed)
Medication Instructions:   Your physician recommends that you continue on your current medications as directed. Please refer to the Current Medication list given to you today.   *If you need a refill on your cardiac medications before your next appointment, please call your pharmacy*   Lab Work:  TODAY!!!! CMET/LIPID  If you have labs (blood work) drawn today and your tests are completely normal, you will receive your results only by: [place] (if you have MyChart) OR A paper copy in the mail If you have any lab test that is abnormal or we need to change your treatment, we will call you to review the results.   Follow-Up: At [facility], you and your health needs are our priority.  As part of our continuing mission to provide you with exceptional heart care, we have created designated Provider Care Teams.  These Care Teams include your primary Cardiologist (physician) and Advanced Practice Providers (APPs -  Physician Assistants and Nurse Practitioners) who all work together to provide you with the care you need, when you need it.  We recommend signing up for the patient portal called "MyChart".  Sign up information is provided on this After Visit Summary.  MyChart is used to connect with patients for Virtual Visits (Telemedicine).  Patients are able to view lab/test results, encounter notes, upcoming appointments, etc.  Non-urgent messages can be sent to your provider as well.   To learn more about what you can do with MyChart, go to [email or web address].    Your next appointment:   6 month(s)  The format for your next appointment:   In Person  Provider:   Fransico Him, MD     [place] refers to food and lifestyle choices that are based on the traditions of countries located on the [place]. It focuses on eating more fruits, vegetables, whole grains, beans, nuts, seeds, and heart-healthy fats, and eating less dairy, meat,  eggs, and processed foods with added sugar, salt, and fat. This way of eating has been shown to help prevent certain conditions and improve outcomes for people who have chronic diseases, like kidney disease and heart disease. What are tips for following this plan? Reading food labels Check the serving size of packaged foods. For foods such as rice and pasta, the serving size refers to the amount of cooked product, not dry. Check the total fat in packaged foods. Avoid foods that have saturated fat or trans fats. Check the ingredient list for added sugars, such as corn syrup. Shopping  Buy a variety of foods that offer a balanced diet, including: Fresh fruits and vegetables (produce). Grains, beans, nuts, and seeds. Some of these may be available in unpackaged forms or large amounts (in bulk). Fresh seafood. Poultry and eggs. Low-fat dairy products. Buy whole ingredients instead of prepackaged foods. Buy fresh fruits and vegetables in-season from local farmers markets. Buy plain frozen fruits and vegetables. If you do not have access to quality fresh seafood, buy precooked frozen shrimp or canned fish, such as tuna, salmon, or sardines. Stock your pantry so you always have certain foods on hand, such as olive oil, canned tuna, canned tomatoes, rice, pasta, and beans. Cooking Cook foods with extra-virgin olive oil instead of using butter or other vegetable oils. Have meat as a side dish, and have vegetables or grains as your main dish. This means having meat in small portions or adding small amounts of meat to foods like pasta or stew. Use beans  or vegetables instead of meat in common dishes like chili or lasagna. Experiment with different cooking methods. Try roasting, broiling, steaming, and sauting vegetables. Add frozen vegetables to soups, stews, pasta, or rice. Add nuts or seeds for added healthy fats and plant protein at each meal. You can add these to yogurt, salads, or vegetable  dishes. Marinate fish or vegetables using olive oil, lemon juice, garlic, and fresh herbs. Meal planning Plan to eat one vegetarian meal one day each week. Try to work up to two vegetarian meals, if possible. Eat seafood two or more times a week. Have healthy snacks readily available, such as: Vegetable sticks with hummus. Greek yogurt. Fruit and nut trail mix. Eat balanced meals throughout the week. This includes: Fruit: 2-3 servings a day. Vegetables: 4-5 servings a day. Low-fat dairy: 2 servings a day. Fish, poultry, or lean meat: 1 serving a day. Beans and legumes: 2 or more servings a week. Nuts and seeds: 1-2 servings a day. Whole grains: 6-8 servings a day. Extra-virgin olive oil: 3-4 servings a day. Limit red meat and sweets to only a few servings a month. Lifestyle  Cook and eat meals together with your family, when possible. Drink enough fluid to keep your urine pale yellow. Be physically active every day. This includes: Aerobic exercise like running or swimming. Leisure activities like gardening, walking, or housework. Get 7-8 hours of sleep each night. If recommended by your health care provider, drink red wine in moderation. This means 1 glass a day for nonpregnant women and 2 glasses a day for men. A glass of wine equals 5 oz (150 mL). What foods should I eat? Fruits Apples. Apricots. Avocado. Berries. Bananas. Cherries. Dates. Figs. Grapes. Lemons. Melon. Oranges. Peaches. Plums. Pomegranate. Vegetables Artichokes. Beets. Broccoli. Cabbage. Carrots. Eggplant. Green beans. Chard. Kale. Spinach. Onions. Leeks. Peas. Squash. Tomatoes. Peppers. Radishes. Grains Whole-grain pasta. Brown rice. Bulgur wheat. Polenta. Couscous. Whole-wheat bread. [place]. Meats and other proteins Beans. Almonds. Sunflower seeds. Pine nuts. Peanuts. [place]. Salmon. Scallops. Shrimp. [place]. Tilapia. Clams. Oysters. Eggs. Poultry without skin. Dairy Low-fat milk. Cheese. Greek  yogurt. Fats and oils Extra-virgin olive oil. Avocado oil. Grapeseed oil. Beverages Water. Red wine. Herbal tea. Sweets and desserts Greek yogurt with honey. Baked apples. Poached pears. Trail mix. Seasonings and condiments Basil. Cilantro. Coriander. Cumin. Mint. Parsley. Sage. Rosemary. Tarragon. Garlic. Oregano. Thyme. Pepper. Balsamic vinegar. Tahini. Hummus. Tomato sauce. Olives. Mushrooms. The items listed above may not be a complete list of foods and beverages you can eat. Contact a dietitian for more information. What foods should I limit? This is a list of foods that should be eaten rarely or only on special occasions. Fruits Fruit canned in syrup. Vegetables Deep-fried potatoes (french fries). Grains Prepackaged pasta or rice dishes. Prepackaged cereal with added sugar. Prepackaged snacks with added sugar. Meats and other proteins Beef. Pork. Lamb. Poultry with skin. Hot dogs. [place] Salines. Dairy Ice cream. Sour cream. Whole milk. Fats and oils Butter. Canola oil. Vegetable oil. Beef fat (tallow). Lard. Beverages Juice. Sugar-sweetened soft drinks. Beer. Liquor and spirits. Sweets and desserts Cookies. Cakes. Pies. Candy. Seasonings and condiments Mayonnaise. Pre-made sauces and marinades. The items listed above may not be a complete list of foods and beverages you should limit. Contact a dietitian for more information. Summary The Mediterranean diet includes both food and lifestyle choices. Eat a variety of fresh fruits and vegetables, beans, nuts, seeds, and whole grains. Limit the amount of red meat and sweets that you eat. If recommended  by your health care provider, drink red wine in moderation. This means 1 glass a day for nonpregnant women and 2 glasses a day for men. A glass of wine equals 5 oz (150 mL). This information is not intended to replace advice given to you by your health care provider. Make sure you discuss any questions you have with your health care  provider. Document Revised: 05/25/2019 Document Reviewed: 03/22/2019 Elsevier Patient Education  [place]

## 2022-01-19 ENCOUNTER — Telehealth: Payer: Self-pay | Admitting: *Deleted

## 2022-01-19 DIAGNOSIS — E785 Hyperlipidemia, unspecified: Secondary | ICD-10-CM

## 2022-01-19 LAB — COMPREHENSIVE METABOLIC PANEL
ALT: 21 IU/L (ref 0–32)
AST: 18 IU/L (ref 0–40)
Albumin/Globulin Ratio: 1.7 (ref 1.2–2.2)
Albumin: 4.7 g/dL (ref 3.9–4.9)
Alkaline Phosphatase: 77 IU/L (ref 44–121)
BUN/Creatinine Ratio: 15 (ref 12–28)
BUN: 20 mg/dL (ref 8–27)
Bilirubin Total: 0.2 mg/dL (ref 0.0–1.2)
CO2: 24 mmol/L (ref 20–29)
Calcium: 10 mg/dL (ref 8.7–10.3)
Chloride: 103 mmol/L (ref 96–106)
Creatinine, Ser: 1.36 mg/dL — ABNORMAL HIGH (ref 0.57–1.00)
Globulin, Total: 2.7 g/dL (ref 1.5–4.5)
Glucose: 158 mg/dL — ABNORMAL HIGH (ref 70–99)
Potassium: 4.5 mmol/L (ref 3.5–5.2)
Sodium: 142 mmol/L (ref 134–144)
Total Protein: 7.4 g/dL (ref 6.0–8.5)
eGFR: 43 mL/min/{1.73_m2} — ABNORMAL LOW (ref >59)

## 2022-01-19 LAB — LIPID PANEL
Chol/HDL Ratio: 3.3 ratio (ref 0.0–4.4)
Cholesterol, Total: 153 mg/dL (ref 100–199)
HDL: 47 mg/dL (ref >39)
LDL Chol Calc (NIH): 87 mg/dL (ref 0–99)
Triglycerides: 102 mg/dL (ref 0–149)
VLDL Cholesterol Cal: 19 mg/dL (ref 5–40)

## 2022-01-19 NOTE — Telephone Encounter (Signed)
Referral to PharmD put in.

## 2022-02-22 ENCOUNTER — Ambulatory Visit: Payer: Medicaid Other | Attending: Cardiovascular Disease | Admitting: Pharmacist

## 2022-02-22 DIAGNOSIS — E785 Hyperlipidemia, unspecified: Secondary | ICD-10-CM | POA: Diagnosis not present

## 2022-02-22 NOTE — Progress Notes (Signed)
Patient ID: Robin Clayton                 DOB: 10/25/1955                    MRN: [id number]     HPI: [name] is a 66 y.o. female patient referred to lipid clinic by Christen Bame, PCP is Dr. Radford Pax. PMH is significant for hypertension, bradycardia, morbid obesity, CKD, COPD, OSA intolerant to CPAP, atrial tachycardia, and transient complete heart block.  Patients lipid regimen was most recently adjusted in July with the addition of zetia '10mg'$  daily. Coronary CT on 12/03/21 was ordered given chest pain and showed CAC score of 792 placing her in the 98th percentile for age and sex, however blood flow analysis showed non-obstructive disease. Patient was last seen by Christen Bame on 01/18/22. At that time, a repeat lipid panel was obtained after starting Zetia, revealing an LDL of 87, which is above goal. Patient was referred to lipid clinic given LDL above goal on high intensity statin and Zetia.  Pt reports today in good spirits. Denies symptoms of chest pain or palpitations at this time. She reports taking her cholesterol medications daily. She endorses a relatively unhealthy diet and low amounts of exercise, but expresses an interest in lifestyle modifications.   Current Medications: atorvastatin 80 mg daily and ezetimibe 10 mg daily Intolerances: none Risk Factors: CAD, HTN, OSA, MESA score 21.7% LDL goal: <55 given CAC >400 with CKD   Diet:  Breakfast: corned beef hash, bacon, eggs, breakfast sausage Lunch: none or snacks Dinner: shrimp and vegetables, fried chicken, meatloaf, fried fish, pork chop, hamburger, salads, veggies  Exercise:  Walks 1-2 times per week, but not for very long  Family History:  The patient's family history includes Asthma in her sister; Cancer in her mother; Diabetes in her father and mother; Heart attack in her father; Hyperlipidemia in her mother; Hypertension in her father and mother; Kidney disease in her father and sister; Stomach cancer (age of onset:  15) in her mother. There is no history of Colon cancer, Colon polyps, Esophageal cancer, or Rectal cancer.  Social History:  Social History   Socioeconomic History   Marital status: Single    Spouse name: Not on file   Number of children: 7   Years of education: Not on file   Highest education level: Not on file  Occupational History   Occupation: Disabled  Tobacco Use   Smoking status: Former    Packs/day: 0.50    Years: 38.00    Total pack years: 19.00    Types: Cigarettes    Quit date: 07/01/2020    Years since quitting: 1.6   Smokeless tobacco: Never  Vaping Use   Vaping Use: Never used  Substance and Sexual Activity   Alcohol use: No   Drug use: No   Sexual activity: Not Currently  Other Topics Concern   Not on file  Social History Narrative   Not on file   Social Determinants of Health   Financial Resource Strain: Not on file  Food Insecurity: Not on file  Transportation Needs: Not on file  Physical Activity: Not on file  Stress: Not on file  Social Connections: Not on file  Intimate Partner Violence: Not on file    Labs: Lipid panel 01/18/22 on atorvastatin 80 mg and Zetia 10 mg daily: TC 153, TG 102, HDL 47, VLDL 19, LDL 87  Lipid panel 11/13/21 on Atorvastatin 80 mg  daily: TC 182, TG 116, HDL 52, VLDL 21, LDL 109  Past Medical History:  Diagnosis Date   Anemia    Anxiety    Atrial tachycardia (HCC)    Bradycardia    Chronic bronchitis (HCC)    Chronic lower back pain    CKD (chronic kidney disease), stage III ([place])    Complete heart block ([place])    a. event monitor in 10/2017 demonstrating nonsustained atrial tachycardia up to 25 beats, sinus bradycardia to sinus tachycardia (30->130bpm), transient complete heart block with ventricular standstill for up to 4 seconds.   Constipation    COPD (chronic obstructive pulmonary disease) (HCC)    GERD (gastroesophageal reflux disease)    takes tums occ   Hyperlipidemia    Hypertension    Morbid obesity  (HCC)    Obstructive sleep apnea    moderate OSA with AHI 24/hr intolerant to CPAP and could not afford oral device   Sleep apnea    Type II diabetes mellitus ([place])     Current Outpatient Medications on File Prior to Visit  Medication Sig Dispense Refill   acetaminophen (TYLENOL) 500 MG tablet Take 1,000 mg by mouth every 6 (six) hours as needed for mild pain.     albuterol (PROVENTIL) (2.5 MG/3ML) 0.083% nebulizer solution Take 2.5 mg by nebulization every 4 (four) hours as needed (shortness of breath).   3   albuterol (VENTOLIN HFA) 108 (90 Base) MCG/ACT inhaler Inhale 2 puffs into the lungs every 6 (six) hours as needed for wheezing.     aspirin 81 MG tablet Take 81 mg by mouth daily.     atorvastatin (LIPITOR) 80 MG tablet TAKE 1 TABLET(80 MG) BY MOUTH DAILY 90 tablet 3   chlorhexidine (PERIDEX) 0.12 % solution SMARTSIG:By Mouth     chlorthalidone (HYGROTON) 25 MG tablet Take 25 mg by mouth daily.     cloNIDine (CATAPRES) 0.1 MG tablet Take 0.1 mg by mouth daily. Also takes 0.'2mg'$  at bedtime     cloNIDine (CATAPRES) 0.2 MG tablet Take 0.2 mg by mouth at bedtime. Also takes 0.'1mg'$  once daily     diltiazem (CARDIZEM CD) 240 MG 24 hr capsule Take 240 mg by mouth at bedtime.     ezetimibe (ZETIA) 10 MG tablet Take 1 tablet (10 mg total) by mouth daily. 90 tablet 3   FEROSUL 325 (65 Fe) MG tablet TAKE 1 TABLET(325 MG) BY MOUTH EVERY OTHER DAY 30 tablet 11   hydrochlorothiazide (MICROZIDE) 12.5 MG capsule Take 12.5 mg by mouth daily.     metFORMIN (GLUCOPHAGE) 500 MG tablet Take 500 mg by mouth See admin instructions. '500mg'$  in the afternoon and '500mg'$  at bedtime     montelukast (SINGULAIR) 10 MG tablet TAKE 1 TABLET(10 MG) BY MOUTH DAILY 30 tablet 11   pantoprazole (PROTONIX) 40 MG tablet Take 40 mg by mouth every morning.     sertraline (ZOLOFT) 25 MG tablet Take 25 mg by mouth daily.      SYMBICORT 80-4.5 MCG/ACT inhaler TAKE 2 PUFFS FIRST THING IN THE AM AND THEN 2 PUFFS 12 HOURS LATER  (Patient taking differently: Inhale 2 puffs into the lungs 2 (two) times daily as needed (wheezing/shortness of breath).) 10.2 g 11   valsartan (DIOVAN) 160 MG tablet Take 160 mg by mouth at bedtime.     No current facility-administered medications on file prior to visit.    Allergies  Allergen Reactions   Wound Dressings Swelling    Assessment/Plan:  1. Hyperlipidemia -  LDL of 87 is above goal of <55 given CAC score >400 and CKD per AACE guidelines. Pt is adherent to her current medications. Will continue atorvastatin 80 mg daily and ezetimibe 10 mg daily. Pt was counseled on heart healthy diet and exercise. Will submit a PA for Repatha 140 mg every 14 days given LDL above goal on high intensity statin and ezetimibe. Pt will be notified upon approval and will need to be scheduled for follow up labs and appointment to lipid clinic. Pt was educated on Repatha administration and potential ADEs.  Thank you for allowing pharmacy to participate in this patient's care.  Reatha Harps, PharmD PGY2 Pharmacy Resident 02/22/2022 3:07 PM Check [email or web address] for unit specific pharmacy number

## 2022-02-22 NOTE — Patient Instructions (Signed)
I will submit information to your insurance for Repatha and let you know when I hear back.    Repatha is a subcutaneous injection given once every 2 weeks in the fatty tissue of your stomach or upper outer thigh. Store the medication in the fridge. You can let your dose warm up to room temperature for 30 minutes before injecting if you prefer. Repatha will lower your LDL cholesterol by 60% and helps to lower your chance of having a heart attack or stroke.  Continue to work on diet and exercise like we discussed today.

## 2022-02-23 ENCOUNTER — Telehealth: Payer: Self-pay | Admitting: Pharmacist

## 2022-02-23 DIAGNOSIS — E785 Hyperlipidemia, unspecified: Secondary | ICD-10-CM

## 2022-02-23 NOTE — Telephone Encounter (Signed)
PA for Repatha approved though 02/23/23

## 2022-02-25 MED ORDER — REPATHA SURECLICK 140 MG/ML ~~LOC~~ SOAJ: 1.0000 mL | SUBCUTANEOUS | 11 refills | Status: DC

## 2022-02-25 NOTE — Telephone Encounter (Signed)
Spoke with patient. Rx sent to pharmacy. Labs ordered and scheduled for 1/4.

## 2022-03-17 ENCOUNTER — Encounter: Payer: Self-pay | Admitting: Internal Medicine

## 2022-03-17 ENCOUNTER — Ambulatory Visit (INDEPENDENT_AMBULATORY_CARE_PROVIDER_SITE_OTHER): Payer: Medicare Other | Admitting: Internal Medicine

## 2022-03-17 VITALS — BP 126/60 | HR 72 | Temp 98.0°F | Ht 63.0 in | Wt 209.2 lb

## 2022-03-17 DIAGNOSIS — G4734 Idiopathic sleep related nonobstructive alveolar hypoventilation: Secondary | ICD-10-CM | POA: Diagnosis not present

## 2022-03-17 DIAGNOSIS — J449 Chronic obstructive pulmonary disease, unspecified: Secondary | ICD-10-CM | POA: Diagnosis not present

## 2022-03-17 DIAGNOSIS — Z23 Encounter for immunization: Secondary | ICD-10-CM

## 2022-03-17 LAB — INFLUENZA INJ QUAD- HIGH DOSE

## 2022-03-17 NOTE — Assessment & Plan Note (Addendum)
-   on CPAP and 02 prn chronically but doesn't use either consistently as of 07/10/2013  - ono RA 07/10/2013 (would prefer to keep 02 if qualifies off cpap) :   desat < 89% x 30 m on RA 07/25/13 so rec continue noct 02  - 03/17/2022   Walked on RA  x  3  lap(s) =  approx 750  ft  @ fast pace, stopped due to end of study  with lowest 02 sats 92%  s sob   As of 03/17/2022 still using 02 prn s am ha or hypersomnolence >  sleep medicine consult can be prn   Advised: Make sure you check your oxygen saturation at your highest level of activity to be sure it stays over 90% and keep track of it at least once a week, more often if breathing getting worse, and let me know if losing ground.          Each maintenance medication was reviewed in detail including emphasizing most importantly the difference between maintenance and prns and under what circumstances the prns are to be triggered using an action plan format where appropriate.  Total time for H and P, chart review, counseling, reviewing hfa/neb/02 device(s) , directly observing portions of ambulatory 02 saturation study/ and generating customized AVS unique to this office visit / same day charting = 33 min

## 2022-03-17 NOTE — Patient Instructions (Addendum)
Continue ymbicort 80 up to  2 puffs first thing in am and then another 2 puffs about 12 hours later.   Work on inhaler technique:  relax and gently blow all the way out then take a nice smooth full deep breath back in, triggering the inhaler at same time you start breathing in.  Hold breath in for at least  5 seconds if you can. Blow out symbicort  thru nose. Rinse and gargle with water when done.  If mouth or throat bother you at all,  try brushing teeth/gums/tongue with arm and hammer toothpaste/ make a slurry and gargle and spit out.      Make sure you check your oxygen saturation at your highest level of activity to be sure it stays over 90% and keep track of it at least once a week, more often if breathing getting worse, and let me know if losing ground.     Please schedule a follow up visit in 6 months but call sooner if needed -  bring your symbicort with you

## 2022-03-17 NOTE — Assessment & Plan Note (Addendum)
Quit smoking  04/11/2019 - ACEi d/c    09/16/2012 due to cough - 12/05/2012 PFTs FEV1 1.22 (58%)  64 and no better p B2,  DLCO 59 and 74% - 03/23/2019   try symb 80 2bid due to prominent hoarseness/ pseudowheezes> improved so stopped all inhalers by f/u 04/23/2019  - PFT's  09/02/20 FEV1 1.09 (56 % ) ratio 0.65  p 2 % improvement from saba p symbicort prior to study with DLCO  11.74 (60%) corrects to 3.49 (82%)  for alv volume and FV curve mildly concave   - 03/17/2022  After extensive coaching inhaler device,  effectiveness =    60%   Despite suboptimal hfa doing fine on very low doses of symbicort more typical of AB than copd. Based on two studies from NEJM  378; 20 p 1865 (2018) and 380 : p2020-30 (2019) in pts with mild asthma (which is more clinically approp fit for dx here ) it is reasonable to use low dose symbicort eg 80 2bid "prn" flare in this setting but I emphasized this was only shown with symbicort and takes advantage of the rapid onset of action but is not the same as "rescue therapy" but can be stopped once the acute symptoms have resolved and the need for rescue has been minimized (< 2 x weekly)

## 2022-03-17 NOTE — Addendum Note (Signed)
Addended by: Christinia Gully B on: 03/17/2022 02:36 PM   Modules accepted: Level of Service

## 2022-03-17 NOTE — Progress Notes (Signed)
Subjective:    Patient ID: Robin Clayton, female    DOB: 1955-05-27  MRN: [id number]   Brief patient profile:  70  yobf  Quit smoking completely 04/11/2019  no problems until around 2000 referred to pulmonary clinic 05/08/2012 by Dr Billey Chang with baseline wt 150 but freq steroids required for flares of ab  to wt 253 at first pulmonary clinic ov and documented GOLD II copd 12/05/12    History of Present Illness  05/08/2012 1st ov/ Robin Clayton/ cc 10 years of intermittent cough and sob responsive to short courses of prednisone progressively worse indolent onset doe even in absence of cough to point where sob across the parking lot then developed cp comes goes x 2 months lasting up to a half a day unless takes vicodin. Location is ant, generalized, assoc with overt HB, non-radiating, no worse walking. Coughing does not make it worse. rec Continue symbicort 160 Take 2 puffs first thing in am and then another 2 puffs about 12 hours later.  Pantoprazole 40 mg Take 30-60 min before first meal of the day  GERD  diet Please schedule a follow up office visit in 4 weeks, sooner if needed with pft's on return > did not return    03/17/2021  f/u ov/Robin Clayton re: GOLD 2 copd  maint on symbicort 80 but confused with details of care Chief Complaint  Patient presents with   Follow-up    Breathing is unchanged since the last visit. She rarely uses her albuterol inhaler or neb.   Dyspnea:  MMRC2 = can't walk a nl pace on a flat grade s sob but does fine slow and flat  Cough: none  Sleeping: 3 pillows bed flat/ 2lpm hs  SABA use: rarely  02: none during the day Covid status:   vax x 3  Rec No change rx    03/17/2022  yearly  f/u ov/Robin Clayton re: GOLD 2 copd /AB maint on symbicort 80 prn  Chief Complaint  Patient presents with   Follow-up    Pt states she has no new issues since LOV.    Dyspnea:  does ok at [employer] pushing cart slow pace, better if pretreats with symbicort despite suboptimal hfa  Cough: sporadic/  dry daytime > noct  Sleeping: on left side, bed is flat / can't tol cpap but feels sleeps fine s it without hypersomnolence  SABA use: neb rarely   02: 2lpm hs and rarely during the day  Lung cancer screening :  per [facility] yearly     No obvious day to day or daytime variability or assoc excess/ purulent sputum or mucus plugs or hemoptysis or cp or chest tightness, subjective wheeze or overt sinus or hb symptoms.   Sleeping  without nocturnal  or early am exacerbation  of respiratory  c/o's or need for noct saba. Also denies any obvious fluctuation of symptoms with weather or environmental changes or other aggravating or alleviating factors except as outlined above   No unusual exposure hx or h/o childhood pna/ asthma or knowledge of premature birth.  Current Allergies, Complete Past Medical History, Past Surgical History, Family History, and Social History were reviewed in [employer] record.  ROS  The following are not active complaints unless bolded Hoarseness, sore throat, dysphagia, dental problems, itching, sneezing,  nasal congestion or discharge of excess mucus or purulent secretions, ear ache,   fever, chills, sweats, unintended wt loss or wt gain, classically pleuritic or exertional cp,  orthopnea pnd or  arm/hand swelling  or leg swelling, presyncope, palpitations, abdominal pain, anorexia, nausea, vomiting, diarrhea  or change in bowel habits or change in bladder habits, change in stools or change in urine, dysuria, hematuria,  rash, arthralgias, visual complaints, headache, numbness, weakness or ataxia or problems with walking or coordination,  change in mood or  memory.        Current Meds  Medication Sig   acetaminophen (TYLENOL) 500 MG tablet Take 1,000 mg by mouth every 6 (six) hours as needed for mild pain.   albuterol (PROVENTIL) (2.5 MG/3ML) 0.083% nebulizer solution Take 2.5 mg by nebulization every 4 (four) hours as needed (shortness of breath).     albuterol (VENTOLIN HFA) 108 (90 Base) MCG/ACT inhaler Inhale 2 puffs into the lungs every 6 (six) hours as needed for wheezing.   aspirin 81 MG tablet Take 81 mg by mouth daily.   atorvastatin (LIPITOR) 80 MG tablet TAKE 1 TABLET(80 MG) BY MOUTH DAILY   chlorhexidine (PERIDEX) 0.12 % solution SMARTSIG:By Mouth   chlorthalidone (HYGROTON) 25 MG tablet Take 25 mg by mouth daily.   cloNIDine (CATAPRES) 0.1 MG tablet Take 0.1 mg by mouth daily. Also takes 0.'2mg'$  at bedtime   cloNIDine (CATAPRES) 0.2 MG tablet Take 0.2 mg by mouth at bedtime. Also takes 0.'1mg'$  once daily   diltiazem (CARDIZEM CD) 240 MG 24 hr capsule Take 240 mg by mouth at bedtime.   Evolocumab (REPATHA SURECLICK) 517 MG/ML SOAJ Inject 140 mg into the skin every 14 (fourteen) days.   ezetimibe (ZETIA) 10 MG tablet Take 1 tablet (10 mg total) by mouth daily.   FEROSUL 325 (65 Fe) MG tablet TAKE 1 TABLET(325 MG) BY MOUTH EVERY OTHER DAY   hydrochlorothiazide (MICROZIDE) 12.5 MG capsule Take 12.5 mg by mouth daily.   metFORMIN (GLUCOPHAGE) 500 MG tablet Take 500 mg by mouth See admin instructions. '500mg'$  in the afternoon and '500mg'$  at bedtime   montelukast (SINGULAIR) 10 MG tablet TAKE 1 TABLET(10 MG) BY MOUTH DAILY   pantoprazole (PROTONIX) 40 MG tablet Take 40 mg by mouth every morning.   sertraline (ZOLOFT) 25 MG tablet Take 25 mg by mouth daily.    SYMBICORT 80-4.5 MCG/ACT inhaler TAKE 2 PUFFS FIRST THING IN THE AM AND THEN 2 PUFFS 12 HOURS LATER (Patient taking differently: Inhale 2 puffs into the lungs 2 (two) times daily as needed (wheezing/shortness of breath).)   valsartan (DIOVAN) 160 MG tablet Take 160 mg by mouth at bedtime.              Objective:   Physical Exam   Wts  03/17/2022     209   03/17/2021     200  04/23/2019     207  03/23/2019     210  12/05/2012         223 > 217 03/05/2013 > 207 07/10/2013 > 01/03/2015   204 > 12/03/2016  190              09/15/12 245 lb (111.131 kg)  05/08/12 253 lb 3.2 oz (114.851 kg)   12/17/11 241 lb 4 oz (109.43 kg)    Vital signs reviewed  03/17/2022  - Note at rest 02 sats  100% on RA    General appearance:    amb mod obese bf nad   HEENT : Oropharynx  clear/upper dentures/ lower partials   Nasal turbinates nl    NECK :  without  apparent JVD/ palpable Nodes/TM    LUNGS: no acc muscle  use,  Min barrel  contour chest wall with bilateral  slightly decreased bs s audible wheeze and  without cough on insp or exp maneuvers and min  Hyperresonant  to  percussion bilaterally    CV:  RRR  no s3 or murmur or increase in P2, and no edema   ABD:  obese soft and nontender    MS:  Nl gait/ ext warm without deformities Or obvious joint restrictions  calf tenderness, cyanosis or clubbing     SKIN: warm and dry without lesions    NEURO:  alert, approp, nl sensorium with  no motor or cerebellar deficits apparent.                I personally reviewed images and agree with radiology impression as follows:   Chest CT cuts on coronary study 12/03/21  Lungs/ pleura There is no pleural effusion. Stable linear scarring within the lingula       Assessment & Plan:

## 2022-05-06 ENCOUNTER — Ambulatory Visit: Payer: Medicare Other | Attending: Cardiology

## 2022-05-06 DIAGNOSIS — E785 Hyperlipidemia, unspecified: Secondary | ICD-10-CM

## 2022-05-06 LAB — LIPID PANEL
Chol/HDL Ratio: 2.7 ratio (ref 0.0–4.4)
Cholesterol, Total: 96 mg/dL — ABNORMAL LOW (ref 100–199)
HDL: 36 mg/dL — ABNORMAL LOW (ref >39)
LDL Chol Calc (NIH): 38 mg/dL (ref 0–99)
Triglycerides: 124 mg/dL (ref 0–149)
VLDL Cholesterol Cal: 22 mg/dL (ref 5–40)

## 2022-05-07 LAB — APOLIPOPROTEIN B: Apolipoprotein B: 54 mg/dL (ref <90)

## 2022-05-28 ENCOUNTER — Ambulatory Visit (HOSPITAL_COMMUNITY)
Admission: RE | Admit: 2022-05-28 | Discharge: 2022-05-28 | Disposition: A | Discharge status: Home / Self Care | Payer: Medicare Other | Source: Ambulatory Visit | Attending: Obstetrics and Gynecology | Admitting: Obstetrics and Gynecology

## 2022-05-28 ENCOUNTER — Other Ambulatory Visit: Payer: Self-pay | Admitting: Internal Medicine

## 2022-05-28 DIAGNOSIS — N838 Other noninflammatory disorders of ovary, fallopian tube and broad ligament: Secondary | ICD-10-CM | POA: Diagnosis present

## 2022-05-30 IMAGING — CT CT CARDIAC CORONARY ARTERY CALCIUM SCORE
3 series · 14 of 20 positions shown, 15 images · non-contrast
Comparison: None.
COMPARISON: None.

Addendum:
EXAM:
OVER-READ INTERPRETATION  CT CHEST

The following report is an over-read performed by radiologist Dr.
Arnulfito Occidente [REDACTED] on 10/09/2019. This
over-read does not include interpretation of cardiac or coronary
anatomy or pathology. The coronary calcium score interpretation by
the cardiologist is attached.
CLINICAL DATA: Risk stratification, Lipid disorder, Hyperlipidemia
Coronary Calcium Score
TECHNIQUE: The patient was scanned on a Siemens Force scanner. Axial
non-contrast 3 mm slices were carried out through the heart. The
data set was analyzed on a dedicated work station and scored using
the Agatson method.

[Series 2: casc 3.0 bv41 2 bestdiast 68 % · axial · 0.34mm/px · z∈[+1289,+1355]mm · 4 of 38 slices shown, 5 images]
[im 8/38  vessel]
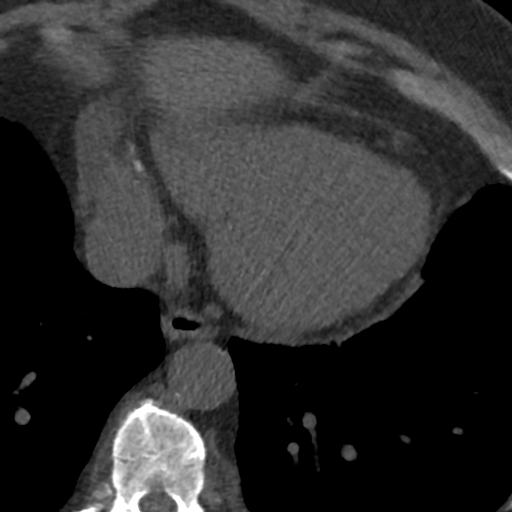
[im 8/38  lung]
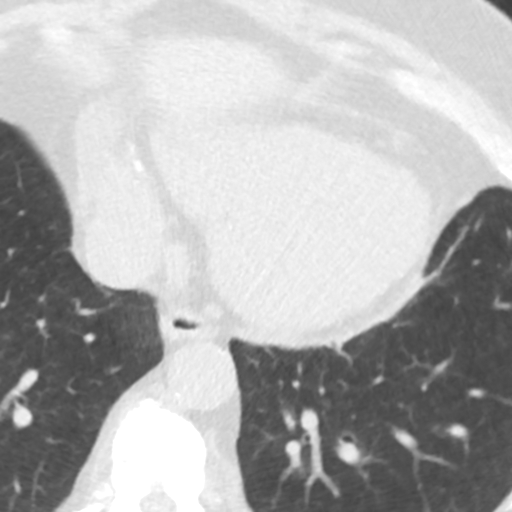
[im 15/38  vessel]
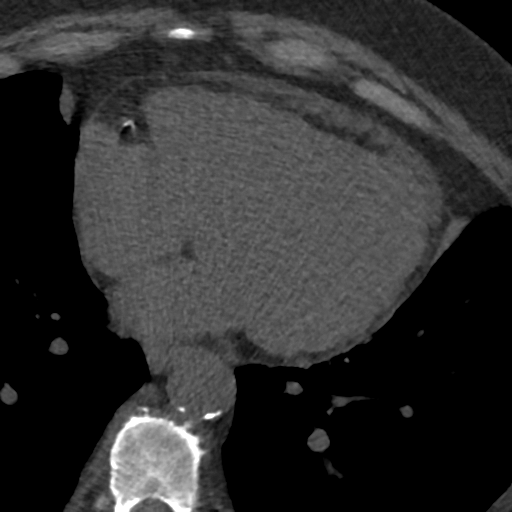
[im 23/38  vessel]
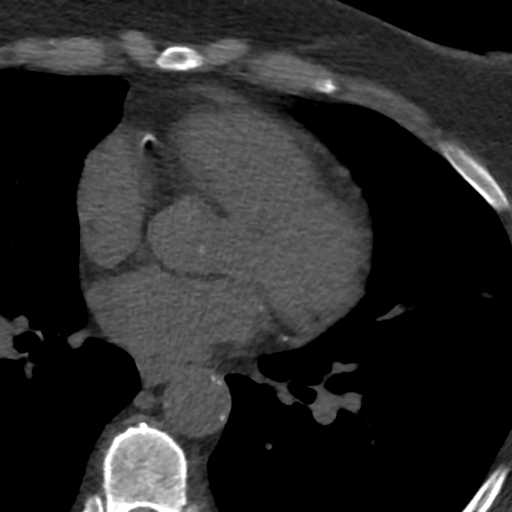
[im 30/38  vessel]
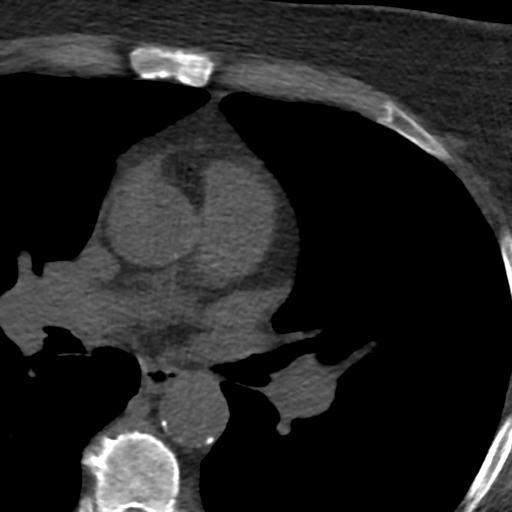

[Series 3: lung 71 % · axial · 0.64mm/px · z∈[+1286,+1358]mm · 5 of 38 slices shown]
[im 7/38  lung]
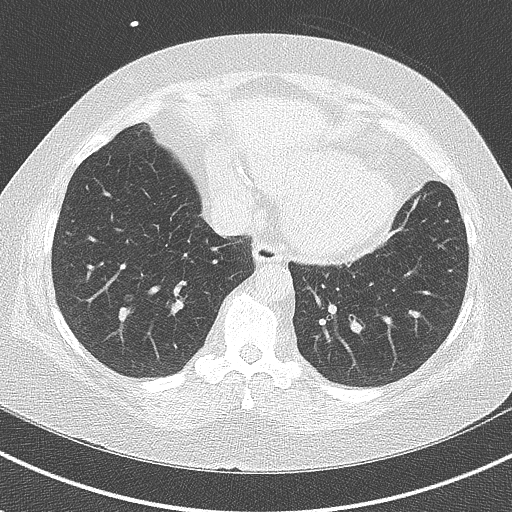
[im 13/38  lung]
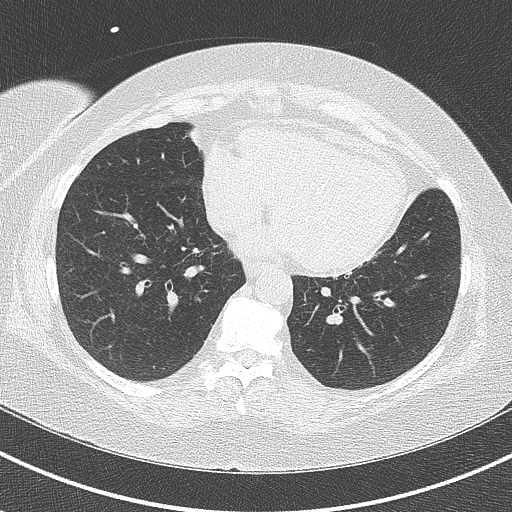
[im 19/38  lung]
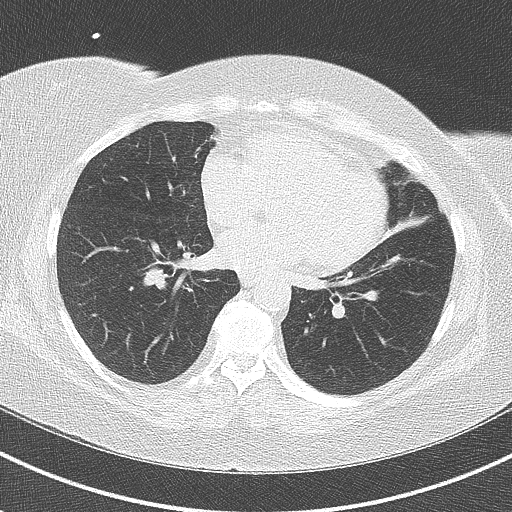
[im 25/38  lung]
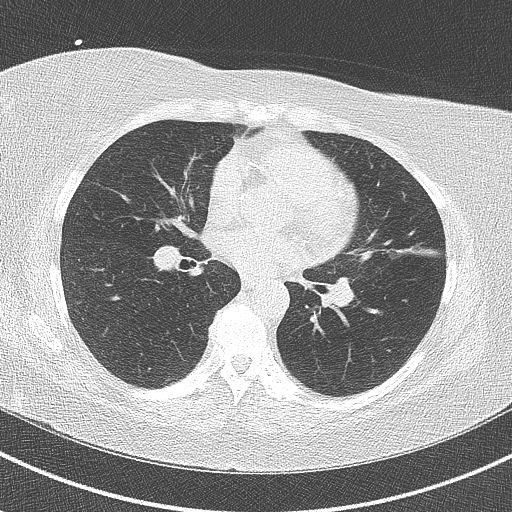
[im 31/38  lung]
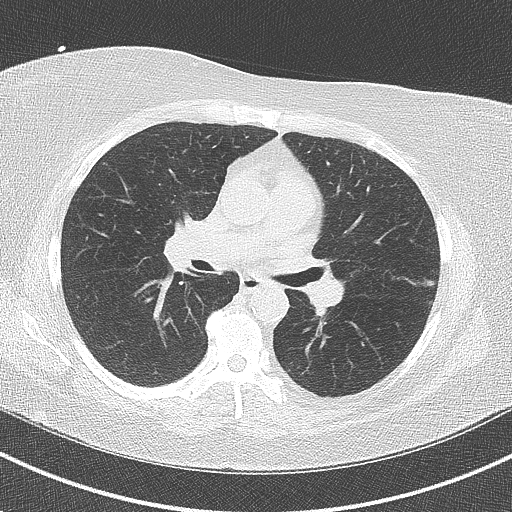

[Series 4: lung st 71 % · axial · 0.64mm/px · z∈[+1286,+1358]mm · 5 of 38 slices shown]
[im 7/38  lung]
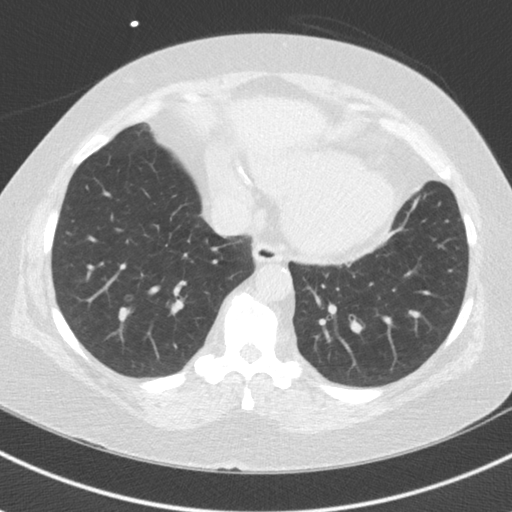
[im 13/38  lung]
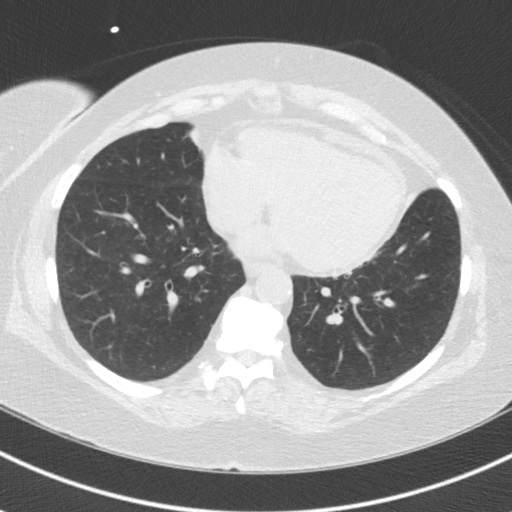
[im 19/38  lung]
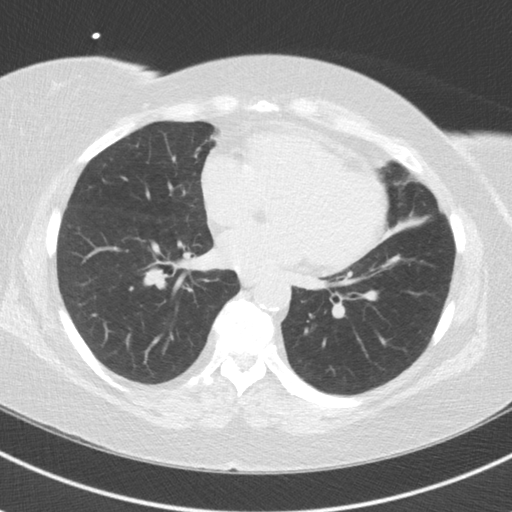
[im 25/38  lung]
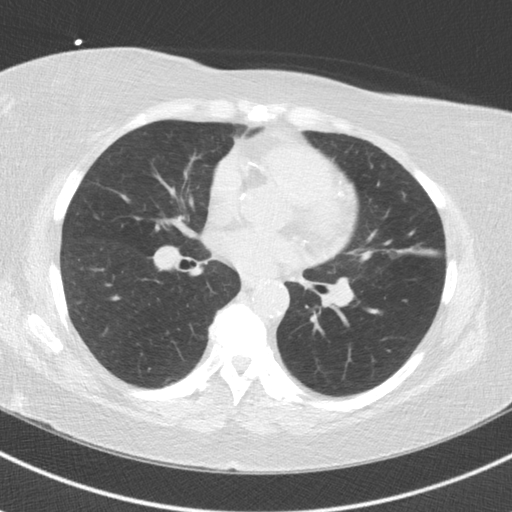
[im 31/38  lung]
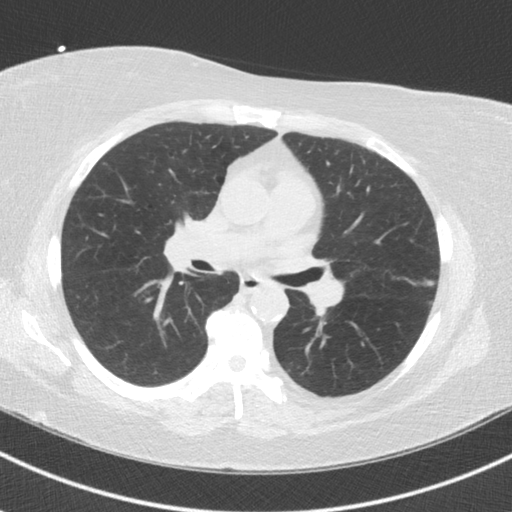

[14 of 20 positions shown; findings below may reference images not displayed]

FINDINGS: Aortic atherosclerosis. Within the visualized portions of the thorax
there are no suspicious appearing pulmonary nodules or masses, there
is no acute consolidative airspace disease, no pleural effusions, no
pneumothorax and no lymphadenopathy. Visualized portions of the
upper abdomen demonstrates an incompletely imaged low-attenuation
lesion measuring at least 2.1 cm in segment 2 of the liver (axial
image 38 of series 4), incompletely characterized on today's
noncontrast CT examination, but statistically likely to represent a
cyst. There are no aggressive appearing lytic or blastic lesions
noted in the visualized portions of the skeleton.
IMPRESSION: 1.  Aortic Atherosclerosis (WDQJS-G0H.H).
2. Low-attenuation lesion in segment 2 of the liver incompletely
imaged, and not characterized on today's noncontrast CT examination,
but statistically likely to represent a cyst.
FINDINGS: Non-cardiac: See separate report from [REDACTED].

Ascending Aorta: 31 mm at mid ascending aorta measured in an axial
plane.

Pericardium: Small anterior and apical pericardial effusion.

Coronary arteries:

Coronary calcium score of 484. This was 98th percentile for age and
sex matched control.
IMPRESSION: Coronary calcium score of 484. This was 98th percentile for age and
sex matched control. Three vessel coronary artery calcifications.

*** End of Addendum ***
EXAM:
OVER-READ INTERPRETATION  CT CHEST

The following report is an over-read performed by radiologist Dr.
Arnulfito Occidente [REDACTED] on 10/09/2019. This
over-read does not include interpretation of cardiac or coronary
anatomy or pathology. The coronary calcium score interpretation by
the cardiologist is attached.
FINDINGS: Aortic atherosclerosis. Within the visualized portions of the thorax
there are no suspicious appearing pulmonary nodules or masses, there
is no acute consolidative airspace disease, no pleural effusions, no
pneumothorax and no lymphadenopathy. Visualized portions of the
upper abdomen demonstrates an incompletely imaged low-attenuation
lesion measuring at least 2.1 cm in segment 2 of the liver (axial
image 38 of series 4), incompletely characterized on today's
noncontrast CT examination, but statistically likely to represent a
cyst. There are no aggressive appearing lytic or blastic lesions
noted in the visualized portions of the skeleton.
IMPRESSION: 1.  Aortic Atherosclerosis (WDQJS-G0H.H).
2. Low-attenuation lesion in segment 2 of the liver incompletely
imaged, and not characterized on today's noncontrast CT examination,
but statistically likely to represent a cyst.

## 2022-06-02 ENCOUNTER — Other Ambulatory Visit: Payer: Self-pay | Admitting: Obstetrics and Gynecology

## 2022-06-02 DIAGNOSIS — N838 Other noninflammatory disorders of ovary, fallopian tube and broad ligament: Secondary | ICD-10-CM

## 2022-07-05 DIAGNOSIS — I350 Nonrheumatic aortic (valve) stenosis: Secondary | ICD-10-CM | POA: Insufficient documentation

## 2022-07-05 DIAGNOSIS — I2510 Atherosclerotic heart disease of native coronary artery without angina pectoris: Secondary | ICD-10-CM | POA: Insufficient documentation

## 2022-07-05 NOTE — Progress Notes (Unsigned)
Cardiology Office Note:    Date:  07/06/2022  ID:  Robin Clayton, DOB 10-24-1955, MRN [id number] [facility] Cardiologist:  Fransico Him, MD      Patient Profile:   Coronary artery disease CAC score 10/10/2019: CAC score 484 (90th percentile) Myoview 10/17/2019: No ischemia or infarction, EF 63 CCTA 12/03/2021: CAC score 792 (98 percentile), distal LCx severe plaque (>70), distal RCA moderate plaque (50-69), LAD minimal plaque (<25), aortic atherosclerosis; FFR normal-consistent with nonobstructive coronary artery disease Aortic stenosis TTE 11/19/2018: EF >65, GR 1 DD, normal RVSF, mild aortic stenosis Atrial tachycardia Event monitor 10/13/2017: Sustained atrial tachycardia up to 25 beats; transient complete heart block with ventricular standstill for up to 4 seconds Rx: Diltiazem Hypertension Hyperlipidemia Chronic kidney disease, stage III Bradycardia Transient complete heart block Prior eval by EP (Dr. Caryl Comes): Nocturnal bradycardia felt to be due to hyper vagotonia secondary to OSA Obesity Sleep apnea intolerant to CPAP Chronic Obstructive Pulmonary Disease      History of Present Illness:   Robin Clayton is a 67 y.o. female who returns for follow-up on CAD, atrial tachycardia.  She was last seen in the office by Christen Bame, NP 01/18/2022.  She is here alone.  She was taken off of Repatha about 3 weeks ago secondary to a rash.  She feels pretty certain that she had this rash before starting on Repatha.  She has not had any improvement since stopping it.  She does note using a different detergent recently and has just switched.  She has not had chest pain.  She has chronic shortness of breath secondary to COPD.  This is unchanged.  She sleeps on an incline chronic without change.  She has not had syncope.  ROS - see HPI    Studies Reviewed:    EKG: NSR, HR 60, left axis deviation, Q waves V1-V2, nonspecific ST-T wave changes, QTc 440, no change from prior  tracing  Risk Assessment/Calculations:             Physical Exam:   VS:  BP 134/70   Pulse 60   Ht '5\' 3"'$  (1.6 m)   Wt 201 lb 3.2 oz (91.3 kg)   LMP 04/30/2012   SpO2 97%   BMI 35.64 kg/m    Wt Readings from Last 3 Encounters:  07/06/22 201 lb 3.2 oz (91.3 kg)  03/17/22 209 lb 3.2 oz (94.9 kg)  01/18/22 205 lb (93 kg)    Constitutional:      Appearance: Healthy appearance. Not in distress.  Neck:     Vascular: No carotid bruit. JVD normal.  Pulmonary:     Breath sounds: Normal breath sounds. No wheezing. No rales.  Cardiovascular:     Normal rate. Regular rhythm. Normal S1. Normal S2.      Murmurs: There is no murmur.  Edema:    Peripheral edema absent.  Abdominal:     Palpations: Abdomen is soft.       ASSESSMENT AND PLAN:   HLD (hyperlipidemia) LDL is optimal at 38 by labs in October 2023.  Her rash does not seem to be associated with Repatha.  She can resume this.  If she notices any worsening rash or changes after restarting this, she should let us know.  Continue Repatha 140 mg every 14 days, Lipitor 80 mg daily, Zetia 10 mg daily.  HYPERTENSION, BENIGN ESSENTIAL Borderline control.  I have asked her to continue to monitor blood pressures at home and notify us if she has  several systolic readings above [id number].  Continue clonidine 0.1 mg in the morning and 0.2 mg in the evening, Cardizem CD 240 mg daily, HCTZ 12.5 mg daily, valsartan 160 mg daily.  Atrial tachycardia ([place]) Overall controlled on current therapy.  Continue Cardizem to 40 mg daily.  Coronary artery disease involving native coronary artery of native heart without angina pectoris Diffuse, mainly distal CAD on CCTA in August 2023.  FFR was normal and consistent with nonobstructive CAD.  She is not having anginal symptoms.  Continue aspirin 81 mg daily, Lipitor 80 mg daily.  Follow-up in 77-month  Nonrheumatic aortic valve stenosis Mild aortic stenosis by echocardiogram in July 2020.  I cannot appreciate a  murmur on exam which is reassuring.  Since has been almost 4 years since her last echocardiogram, arrange follow-up echo.  OSA (obstructive sleep apnea) Intolerant of CPAP.  She remains on nocturnal O2.  Stage 3b chronic kidney disease ([place] Most recent creatinine in September 2023 was 1.36.        Dispo:  Return in about 6 months (around 01/06/2023) for Routine Follow Up with Dr. TRadford Pax Signed, SRichardson Dopp PA-C

## 2022-07-06 ENCOUNTER — Encounter: Payer: Self-pay | Admitting: Physician Assistant

## 2022-07-06 ENCOUNTER — Ambulatory Visit: Payer: Medicare Other | Attending: Cardiology | Admitting: Physician Assistant

## 2022-07-06 VITALS — BP 134/70 | HR 60 | Ht 63.0 in | Wt 201.2 lb

## 2022-07-06 DIAGNOSIS — G4733 Obstructive sleep apnea (adult) (pediatric): Secondary | ICD-10-CM | POA: Insufficient documentation

## 2022-07-06 DIAGNOSIS — I10 Essential (primary) hypertension: Secondary | ICD-10-CM | POA: Diagnosis present

## 2022-07-06 DIAGNOSIS — I350 Nonrheumatic aortic (valve) stenosis: Secondary | ICD-10-CM | POA: Insufficient documentation

## 2022-07-06 DIAGNOSIS — E7800 Pure hypercholesterolemia, unspecified: Secondary | ICD-10-CM | POA: Insufficient documentation

## 2022-07-06 DIAGNOSIS — I2510 Atherosclerotic heart disease of native coronary artery without angina pectoris: Secondary | ICD-10-CM | POA: Diagnosis present

## 2022-07-06 DIAGNOSIS — N1832 Chronic kidney disease, stage 3b: Secondary | ICD-10-CM | POA: Insufficient documentation

## 2022-07-06 DIAGNOSIS — I4719 Other supraventricular tachycardia: Secondary | ICD-10-CM | POA: Diagnosis present

## 2022-07-06 NOTE — Assessment & Plan Note (Signed)
Borderline control.  I have asked her to continue to monitor blood pressures at home and notify us if she has several systolic readings above [id number].  Continue clonidine 0.1 mg in the morning and 0.2 mg in the evening, Cardizem CD 240 mg daily, HCTZ 12.5 mg daily, valsartan 160 mg daily.

## 2022-07-06 NOTE — Assessment & Plan Note (Signed)
Intolerant of CPAP.  She remains on nocturnal O2.

## 2022-07-06 NOTE — Assessment & Plan Note (Signed)
Diffuse, mainly distal CAD on CCTA in August 2023.  FFR was normal and consistent with nonobstructive CAD.  She is not having anginal symptoms.  Continue aspirin 81 mg daily, Lipitor 80 mg daily.  Follow-up in 30-month

## 2022-07-06 NOTE — Assessment & Plan Note (Signed)
Most recent creatinine in September 2023 was 1.36.

## 2022-07-06 NOTE — Assessment & Plan Note (Signed)
LDL is optimal at 38 by labs in October 2023.  Her rash does not seem to be associated with Repatha.  She can resume this.  If she notices any worsening rash or changes after restarting this, she should let us know.  Continue Repatha 140 mg every 14 days, Lipitor 80 mg daily, Zetia 10 mg daily.

## 2022-07-06 NOTE — Patient Instructions (Signed)
Medication Instructions:  Your physician has recommended you make the following change in your medication:   RESTART Repatha  *If you need a refill on your cardiac medications before your next appointment, please call your pharmacy*   Lab Work: None ordered  If you have labs (blood work) drawn today and your tests are completely normal, you will receive your results only by: [place] (if you have MyChart) OR A paper copy in the mail If you have any lab test that is abnormal or we need to change your treatment, we will call you to review the results.   Testing/Procedures: Your physician has requested that you have an echocardiogram. Echocardiography is a painless test that uses sound waves to create images of your heart. It provides your doctor with information about the size and shape of your heart and how well your heart's chambers and valves are working. This procedure takes approximately one hour. There are no restrictions for this procedure. Please do NOT wear cologne, perfume, aftershave, or lotions (deodorant is allowed). Please arrive 15 minutes prior to your appointment time.    Follow-Up: At [facility], you and your health needs are our priority.  As part of our continuing mission to provide you with exceptional heart care, we have created designated Provider Care Teams.  These Care Teams include your primary Cardiologist (physician) and Advanced Practice Providers (APPs -  Physician Assistants and Nurse Practitioners) who all work together to provide you with the care you need, when you need it.  We recommend signing up for the patient portal called "MyChart".  Sign up information is provided on this After Visit Summary.  MyChart is used to connect with patients for Virtual Visits (Telemedicine).  Patients are able to view lab/test results, encounter notes, upcoming appointments, etc.  Non-urgent messages can be sent to your provider as well.   To learn more  about what you can do with MyChart, go to [email or web address].    Your next appointment:   6 month(s)  Provider:   Fransico Him, MD     Other Instructions

## 2022-07-06 NOTE — Assessment & Plan Note (Signed)
Overall controlled on current therapy.  Continue Cardizem to 40 mg daily.

## 2022-07-06 NOTE — Assessment & Plan Note (Signed)
Mild aortic stenosis by echocardiogram in July 2020.  I cannot appreciate a murmur on exam which is reassuring.  Since has been almost 4 years since her last echocardiogram, arrange follow-up echo.

## 2022-07-14 ENCOUNTER — Other Ambulatory Visit: Payer: Self-pay | Admitting: Adult Health

## 2022-07-14 DIAGNOSIS — J449 Chronic obstructive pulmonary disease, unspecified: Secondary | ICD-10-CM

## 2022-07-26 ENCOUNTER — Ambulatory Visit: Payer: Medicaid Other | Admitting: Cardiology

## 2022-08-01 ENCOUNTER — Other Ambulatory Visit: Payer: Self-pay | Admitting: Cardiology

## 2022-08-03 ENCOUNTER — Ambulatory Visit (HOSPITAL_COMMUNITY): Payer: Medicare Other | Attending: Cardiology

## 2022-08-03 DIAGNOSIS — I4719 Other supraventricular tachycardia: Secondary | ICD-10-CM | POA: Diagnosis present

## 2022-08-03 DIAGNOSIS — I2510 Atherosclerotic heart disease of native coronary artery without angina pectoris: Secondary | ICD-10-CM

## 2022-08-03 DIAGNOSIS — I350 Nonrheumatic aortic (valve) stenosis: Secondary | ICD-10-CM

## 2022-08-03 LAB — ECHOCARDIOGRAM COMPLETE
AR max vel: 2.01 cm2
AV Area VTI: 2.01 cm2
AV Area mean vel: 1.98 cm2
AV Mean grad: 5.2 mmHg
AV Peak grad: 9.9 mmHg
Ao pk vel: 1.57 m/s
Area-P 1/2: 3.29 cm2
S' Lateral: 2.6 cm

## 2022-08-05 ENCOUNTER — Other Ambulatory Visit: Payer: Self-pay | Admitting: Nurse Practitioner

## 2022-08-21 ENCOUNTER — Other Ambulatory Visit: Payer: Self-pay | Admitting: Gastroenterology

## 2022-08-29 ENCOUNTER — Encounter (HOSPITAL_COMMUNITY): Payer: Self-pay

## 2022-08-29 ENCOUNTER — Ambulatory Visit (HOSPITAL_COMMUNITY)
Admission: EM | Admit: 2022-08-29 | Discharge: 2022-08-29 | Disposition: A | Discharge status: Home / Self Care | Payer: Medicare Other | Attending: Internal Medicine | Admitting: Internal Medicine

## 2022-08-29 DIAGNOSIS — N1831 Chronic kidney disease, stage 3a: Secondary | ICD-10-CM | POA: Diagnosis present

## 2022-08-29 DIAGNOSIS — M10371 Gout due to renal impairment, right ankle and foot: Secondary | ICD-10-CM | POA: Insufficient documentation

## 2022-08-29 DIAGNOSIS — M79671 Pain in right foot: Secondary | ICD-10-CM | POA: Diagnosis present

## 2022-08-29 LAB — CBC
HCT: 37.3 % (ref 36.0–46.0)
Hemoglobin: 11.7 g/dL — ABNORMAL LOW (ref 12.0–15.0)
MCH: 26.6 pg (ref 26.0–34.0)
MCHC: 31.4 g/dL (ref 30.0–36.0)
MCV: 84.8 fL (ref 80.0–100.0)
Platelets: 231 10*3/uL (ref 150–400)
RBC: 4.4 MIL/uL (ref 3.87–5.11)
RDW: 15.2 % (ref 11.5–15.5)
WBC: 7.4 10*3/uL (ref 4.0–10.5)
nRBC: 0 % (ref 0.0–0.2)

## 2022-08-29 LAB — BASIC METABOLIC PANEL
Anion gap: 8 (ref 5–15)
BUN: 19 mg/dL (ref 8–23)
CO2: 26 mmol/L (ref 22–32)
Calcium: 9.2 mg/dL (ref 8.9–10.3)
Chloride: 102 mmol/L (ref 98–111)
Creatinine, Ser: 1.37 mg/dL — ABNORMAL HIGH (ref 0.44–1.00)
GFR, Estimated: 43 mL/min — ABNORMAL LOW (ref >60)
Glucose, Bld: 147 mg/dL — ABNORMAL HIGH (ref 70–99)
Potassium: 4.2 mmol/L (ref 3.5–5.1)
Sodium: 136 mmol/L (ref 135–145)

## 2022-08-29 MED ORDER — COLCHICINE 0.6 MG PO TABS: ORAL_TABLET | ORAL | 0 refills | Status: DC

## 2022-08-29 NOTE — ED Triage Notes (Signed)
Patient presenting with Right Foot Pain & Swelling onset 3 days ago upon waking. No known falls or injuries.  Patient has tried Tylenol with little relief.

## 2022-08-29 NOTE — ED Provider Notes (Signed)
[facility]    CSN: [id number] Arrival date & time: 08/29/22  1147      History   Chief Complaint Chief Complaint  Patient presents with  . Foot Pain    HPI Robin Clayton is a 67 y.o. female.   Patient with history of type 2 diabetes, CKD stage III, COPD, CAD, hyperlipidemia, hypertension, and morbid obesity presents to urgent care for evaluation of right foot swelling, pain, and redness that started abruptly 3 days ago while she was walking.  She states pain to the right foot is significantly worse with light touch and with walking.  She has not been able to place a shoe on the right foot due to significant tenderness.  Denies history of gouty arthritis.  She is also experiencing pain to the right great toe but states that the lateral aspect of her right ankle is where she is experiencing the most pain.  Denies fever, chills, recent trauma/injury to the right foot or ankle, laceration or abrasion to the right foot/ankle, recent infection to the area of greatest tenderness, and recent falls.  No calf pain, recent long periods of travel, or history of DVT/PE.  She does not take anticoagulation.  She has not attempted use of any over-the-counter medications to help with symptoms before coming to urgent care.  Pain to the right foot/ankle is currently a 10 on a scale of 0-10. She eats red meat 1-2 times per month.    Foot Pain   Past Medical History:  Diagnosis Date  . Anemia   . Anxiety   . Atrial tachycardia   . Bradycardia   . Chronic bronchitis (HCC)   . Chronic lower back pain   . CKD (chronic kidney disease), stage III (HCC)   . Complete heart block (HCC)    a. event monitor in 10/2017 demonstrating nonsustained atrial tachycardia up to 25 beats, sinus bradycardia to sinus tachycardia (30->130bpm), transient complete heart block with ventricular standstill for up to 4 seconds.  . Constipation   . COPD (chronic obstructive pulmonary disease) (HCC)   . GERD  (gastroesophageal reflux disease)    takes tums occ  . Hyperlipidemia   . Hypertension   . Morbid obesity (HCC)   . Obstructive sleep apnea    moderate OSA with AHI 24/hr intolerant to CPAP and could not afford oral device  . Sleep apnea   . Type II diabetes mellitus [facility])     Patient Active Problem List   Diagnosis Date Noted  . Stage 3a chronic kidney disease (HCC) 08/29/2022  . Stage 3b chronic kidney disease (HCC) 07/06/2022  . Coronary artery disease involving native coronary artery of native heart without angina pectoris 07/05/2022  . Nonrheumatic aortic valve stenosis 07/05/2022  . Endometrial thickening on ultrasound 10/08/2021  . Ovarian mass 08/26/2021  . PMB (postmenopausal bleeding) 07/08/2021  . Iron deficiency anemia 11/06/2020  . Gastroesophageal reflux disease 11/06/2020  . Constipation 11/06/2020  . Chronic respiratory failure with hypoxia (HCC) 04/18/2020  . Atrial tachycardia 04/17/2019  . Upper airway cough syndrome 12/05/2016  . Heart palpitations 08/21/2015  . OSA (obstructive sleep apnea) 07/25/2013  . Nocturnal hypoxemia 07/10/2013  . Bradycardia 07/02/2011  . HYPERTHYROIDISM 01/28/2010  . Morbid obesity due to excess calories (HCC) c/b hbp, dm,hyperlipidemia  01/28/2010  . BACK PAIN 01/28/2010  . HLD (hyperlipidemia) 01/03/2010  . HYPERTENSION, BENIGN ESSENTIAL 01/03/2010  . COPD GOLD II 01/03/2010    Past Surgical History:  Procedure Laterality Date  . BREAST BIOPSY  12/06/2011   Procedure: BREAST BIOPSY WITH NEEDLE LOCALIZATION;  Surgeon: Emelia Loron, MD;  Location: [facility] OR;  Service: General;  Laterality: Left;  . CESAREAN SECTION  1970  . JOINT REPLACEMENT    . REVISION TOTAL HIP ARTHROPLASTY Bilateral     OB History     Gravida  7   Para  7   Term  7   Preterm      AB      Living  6      SAB      IAB      Ectopic      Multiple      Live Births               Home Medications    Prior to Admission  medications   Medication Sig Start Date End Date Taking? Authorizing Provider  acetaminophen (TYLENOL) 500 MG tablet Take 1,000 mg by mouth every 6 (six) hours as needed for mild pain.   Yes [provider]  albuterol (PROVENTIL) (2.5 MG/3ML) 0.083% nebulizer solution Take 2.5 mg by nebulization every 4 (four) hours as needed (shortness of breath).  05/14/14  Yes [provider]  albuterol (VENTOLIN HFA) 108 (90 Base) MCG/ACT inhaler Inhale 2 puffs into the lungs every 6 (six) hours as needed for wheezing.   Yes [provider]  aspirin 81 MG tablet Take 81 mg by mouth daily.   Yes [provider]  atorvastatin (LIPITOR) 80 MG tablet TAKE 1 TABLET(80 MG) BY MOUTH DAILY 08/02/22  Yes Tereso Newcomer T, PA-C  chlorhexidine (PERIDEX) 0.12 % solution SMARTSIG:By Mouth 09/21/21  Yes [provider]  cloNIDine (CATAPRES) 0.1 MG tablet Take 0.1 mg by mouth daily. Also takes 0.2mg  at bedtime 09/28/19  Yes [provider]  cloNIDine (CATAPRES) 0.2 MG tablet Take 0.2 mg by mouth at bedtime. Also takes 0.1mg  once daily   Yes [provider]  colchicine 0.6 MG tablet Take colchicine 1.2 mg (2 pills) when you pick up your prescription.  You may take 1 more pill (0.6 mg) 1 hour after you take your first dose.  Tomorrow, start taking 1 pill of colchicine in the morning (0.6 mg)  with food for 5 days to fully treat your gout flare. 08/29/22  Yes Carlisle Beers, FNP  diltiazem (CARDIZEM CD) 240 MG 24 hr capsule Take 240 mg by mouth at bedtime.   Yes [provider]  ezetimibe (ZETIA) 10 MG tablet Take 1 tablet (10 mg total) by mouth daily. 11/16/21 11/17/22 Yes Swinyer, Zachary George, NP  FEROSUL 325 (65 Fe) MG tablet TAKE 1 TABLET(325 MG) BY MOUTH EVERY OTHER DAY 08/21/21  Yes Tressia Danas, MD  hydrochlorothiazide (MICROZIDE) 12.5 MG capsule Take 12.5 mg by mouth daily. 03/10/18  Yes [provider]  metFORMIN (GLUCOPHAGE) 500 MG tablet  Take 500 mg by mouth See admin instructions. 500mg  in the afternoon and 500mg  at bedtime 06/16/17  Yes [provider]  montelukast (SINGULAIR) 10 MG tablet TAKE 1 TABLET(10 MG) BY MOUTH DAILY 05/28/22  Yes Nyoka Cowden, MD  pantoprazole (PROTONIX) 40 MG tablet Take 40 mg by mouth every morning. 12/18/20  Yes [provider]  sertraline (ZOLOFT) 25 MG tablet Take 25 mg by mouth daily.  06/11/19  Yes [provider]  SYMBICORT 80-4.5 MCG/ACT inhaler INHALE 2 PUFFS BY MOUTH FIRST THING IN THE MORNING THEN 2 PUFFS BY MOUTH 12 HOURS LATER 07/22/22  Yes Parrett, Tammy S, NP  valsartan (  DIOVAN) 160 MG tablet Take 160 mg by mouth at bedtime.   Yes [provider]  Evolocumab (REPATHA SURECLICK) 140 MG/ML SOAJ Inject 140 mg into the skin every 14 (fourteen) days. 02/25/22   Quintella Reichert, MD    Family History Family History  Problem Relation Age of Onset  . Stomach cancer Mother 74  . Hypertension Mother   . Hyperlipidemia Mother   . Diabetes Mother   . Cancer Mother        stomach  . Kidney disease Father   . Hypertension Father   . Diabetes Father   . Heart attack Father   . Kidney disease Sister   . Asthma Sister   . Colon cancer Neg Hx   . Colon polyps Neg Hx   . Esophageal cancer Neg Hx   . Rectal cancer Neg Hx     Social History Social History   Tobacco Use  . Smoking status: Former    Packs/day: 0.50    Years: 38.00    Additional pack years: 0.00    Total pack years: 19.00    Types: Cigarettes    Quit date: 07/01/2020    Years since quitting: 2.1  . Smokeless tobacco: Never  Vaping Use  . Vaping Use: Never used  Substance Use Topics  . Alcohol use: No  . Drug use: No     Allergies   Wound dressings and Wound dressing adhesive   Review of Systems Review of Systems Per HPI  Physical Exam Triage Vital Signs ED Triage Vitals  Enc Vitals Group     BP 08/29/22 1221 117/73     Pulse Rate 08/29/22 1221 61     Resp 08/29/22 1221  18     Temp 08/29/22 1221 98.7 F (37.1 C)     Temp Source 08/29/22 1221 Oral     SpO2 08/29/22 1221 97 %     Weight --      Height --      Head Circumference --      Peak Flow --      Pain Score 08/29/22 1219 10     Pain Loc --      Pain Edu? --      Excl. in GC? --    No data found.  Updated Vital Signs BP 117/73 (BP Location: Right Arm)   Pulse 61   Temp 98.7 F (37.1 C) (Oral)   Resp 18   LMP 04/30/2012   SpO2 97%   Visual Acuity Right Eye Distance:   Left Eye Distance:   Bilateral Distance:    Right Eye Near:   Left Eye Near:    Bilateral Near:     Physical Exam Vitals and nursing note reviewed.  Constitutional:      Appearance: She is not ill-appearing or toxic-appearing.     Comments: Patient seated comfortably in wheelchair without acute distress.  HENT:     Head: Normocephalic and atraumatic.     Right Ear: Hearing and external ear normal.     Left Ear: Hearing and external ear normal.     Nose: Nose normal.     Mouth/Throat:     Lips: Pink.     Mouth: Mucous membranes are moist. No injury.     Tongue: No lesions. Tongue does not deviate from midline.     Palate: No mass and lesions.     Pharynx: Oropharynx is clear. Uvula midline. No pharyngeal swelling, oropharyngeal exudate, posterior oropharyngeal erythema or  uvula swelling.     Tonsils: No tonsillar exudate or tonsillar abscesses.  Eyes:     General: Lids are normal. Vision grossly intact. Gaze aligned appropriately.     Extraocular Movements: Extraocular movements intact.     Conjunctiva/sclera: Conjunctivae normal.  Pulmonary:     Effort: Pulmonary effort is normal.  Musculoskeletal:     Cervical back: Neck supple.     Right ankle: Swelling present. No deformity, ecchymosis or lacerations. Tenderness present over the lateral malleolus (with light palpation). Decreased range of motion (secondary to pain). Normal pulse (+2 bilateral DPs).     Right Achilles Tendon: Normal.     Left ankle: No  swelling.     Left Achilles Tendon: Normal.     Right foot: Normal range of motion and normal capillary refill. Swelling and tenderness (right great toe tenderness to the MCP) present. No deformity, bunion, Charcot foot, foot drop, prominent metatarsal heads or laceration. Normal pulse.     Left foot: Normal.     Comments: Erythema present to the generalized right lateral malleolus of the ankle with associated warmth to palpation. Sensation and strength intact distally to bilateral lower extremities. No signs of injury. Patient unable to bear weight to the right lower extremity due to severe pain.   Skin:    General: Skin is warm and dry.     Capillary Refill: Capillary refill takes less than 2 seconds.     Findings: No rash.  Neurological:     General: No focal deficit present.     Mental Status: She is alert and oriented to person, place, and time. Mental status is at baseline.     Cranial Nerves: No dysarthria or facial asymmetry.  Psychiatric:        Mood and Affect: Mood normal.        Speech: Speech normal.        Behavior: Behavior normal.        Thought Content: Thought content normal.        Judgment: Judgment normal.     UC Treatments / Results  Labs (all labs ordered are listed, but only abnormal results are displayed) Labs Reviewed  CBC  BASIC METABOLIC PANEL    EKG   Radiology No results found.  Procedures Procedures (including critical care time)  Medications Ordered in UC Medications - No data to display  Initial Impression / Assessment and Plan / UC Course  I have reviewed the triage vital signs and the nursing notes.  Pertinent labs & imaging results that were available during my care of the patient were reviewed by me and considered in my medical decision making (see chart for details).   1. Acute gout due to renal impairment, stage 3a chronic kidney disease Presentation consistent with  Creatinine clearance based on calculation is 59 based off of  labs from 6/23.   *** Final Clinical Impressions(s) / UC Diagnoses   Final diagnoses:  Acute gout due to renal impairment involving right ankle  Stage 3a chronic kidney disease [facility])     Discharge Instructions      Your right  foot pain is due to gout.  This is inflammation due to build up of uric acid crystals in your joint of your right foot. Take colchicine as prescribed (2 pills when you get the prescription, then 1 pill one hour later today). 1 pill with food in the morning for the next 5 mornings.  I will call you if your blood work is  abnormal.  Schedule an appointment with your primary care provider in the next 1-2 weeks.  Avoid eating foods like red meat etc. That can cause gout flares. Look at the information in your packet regarding low purine diet to prevent further gout flares.  If you develop any new or worsening symptoms or do not improve in the next 2 to 3 days, please return.  If your symptoms are severe, please go to the emergency room.  Follow-up with your primary care provider for further evaluation and management of your symptoms as well as ongoing wellness visits.  I hope you feel better!     ED Prescriptions     Medication Sig Dispense Auth. Provider   colchicine 0.6 MG tablet Take colchicine 1.2 mg (2 pills) when you pick up your prescription.  You may take 1 more pill (0.6 mg) 1 hour after you take your first dose.  Tomorrow, start taking 1 pill of colchicine in the morning (0.6 mg)  with food for 5 days to fully treat your gout flare. 8 tablet Carlisle Beers, FNP      PDMP not reviewed this encounter.

## 2022-08-29 NOTE — Discharge Instructions (Signed)
Your right  foot pain is due to gout.  This is inflammation due to build up of uric acid crystals in your joint of your right foot. Take colchicine as prescribed (2 pills when you get the prescription, then 1 pill one hour later today). 1 pill with food in the morning for the next 5 mornings.  I will call you if your blood work is abnormal.  Schedule an appointment with your primary care provider in the next 1-2 weeks.  Avoid eating foods like red meat etc. That can cause gout flares. Look at the information in your packet regarding low purine diet to prevent further gout flares.  If you develop any new or worsening symptoms or do not improve in the next 2 to 3 days, please return.  If your symptoms are severe, please go to the emergency room.  Follow-up with your primary care provider for further evaluation and management of your symptoms as well as ongoing wellness visits.  I hope you feel better!

## 2022-09-06 ENCOUNTER — Other Ambulatory Visit: Payer: Self-pay | Admitting: Gastroenterology

## 2022-09-15 ENCOUNTER — Ambulatory Visit (INDEPENDENT_AMBULATORY_CARE_PROVIDER_SITE_OTHER): Payer: Medicare Other | Admitting: Internal Medicine

## 2022-09-15 ENCOUNTER — Encounter: Payer: Self-pay | Admitting: Internal Medicine

## 2022-09-15 VITALS — BP 120/60 | HR 60 | Temp 98.0°F | Ht 63.5 in | Wt 203.4 lb

## 2022-09-15 DIAGNOSIS — G4734 Idiopathic sleep related nonobstructive alveolar hypoventilation: Secondary | ICD-10-CM | POA: Diagnosis not present

## 2022-09-15 DIAGNOSIS — J449 Chronic obstructive pulmonary disease, unspecified: Secondary | ICD-10-CM

## 2022-09-15 MED ORDER — SPIRIVA RESPIMAT 2.5 MCG/ACT IN AERS: 2.0000 | INHALATION_SPRAY | Freq: Every morning | RESPIRATORY_TRACT | 0 refills | Status: DC

## 2022-09-15 MED ORDER — SPIRIVA RESPIMAT 2.5 MCG/ACT IN AERS: 2.0000 | INHALATION_SPRAY | Freq: Every morning | RESPIRATORY_TRACT | 5 refills | Status: DC

## 2022-09-15 NOTE — Patient Instructions (Addendum)
Plan A = Automatic = Always=    Symbicort 80 Take 2 puffs first thing in am and then another 2 puffs about 12 hours later and follow the am doses of symbicort with spiriva 2 puffs just in am   .Work on inhaler technique:  relax and gently blow all the way out then take a nice smooth full deep breath back in, triggering the inhaler at same time you start breathing in.  Hold breath in for at least  5 seconds if you can. Blow out symbicort  thru nose. Rinse and gargle with water when done.  If mouth or throat bother you at all,  try brushing teeth/gums/tongue with arm and hammer toothpaste/ make a slurry and gargle and spit out.    Plan B = Backup (to supplement plan A, not to replace it) Only use your albuterol inhaler as a rescue medication to be used if you can't catch your breath by resting or doing a relaxed purse lip breathing pattern.  - The less you use it, the better it will work when you need it. - Ok to use the inhaler up to 2 puffs  every 4 hours if you must but call for appointment if use goes up over your usual need - Don't leave home without it !!  (think of it like the spare tire for your car)   Plan C = Crisis (instead of Plan B but only if Plan B stops working) - only use your albuterol nebulizer if you first try Plan B and it fails to help > ok to use the nebulizer up to every 4 hours but if start needing it regularly call for immediate appointment    Please schedule a follow up visit in 3 months but call sooner if needed - bring inhalers   Late add consider change to stiolto if responds to adding spiriva to symbicort 80 and no tendency to aecopd identified in meantime.

## 2022-09-15 NOTE — Progress Notes (Signed)
Subjective:    Patient ID: Robin Clayton, female    DOB: 02-02-1956  MRN: [id number]   Brief patient profile:  64  yobf  Quit smoking completely 04/11/2019  no problems until around 2000 referred to pulmonary clinic 05/08/2012 by Dr Asencion Partridge with baseline wt 150 but freq steroids required for flares of AB  to wt 253 at first pulmonary clinic ov and documented GOLD II copd 12/05/12    History of Present Illness  05/08/2012 1st ov/ Robin Clayton/ cc 10 years of intermittent cough and sob responsive to short courses of prednisone progressively worse indolent onset doe even in absence of cough to point where sob across the parking lot then developed cp comes goes x 2 months lasting up to a half a day unless takes vicodin. Location is ant, generalized, assoc with overt HB, non-radiating, no worse walking. Coughing does not make it worse. rec Continue symbicort 160 Take 2 puffs first thing in am and then another 2 puffs about 12 hours later.  Pantoprazole 40 mg Take 30-60 min before first meal of the day  GERD  diet     03/17/2022  yearly  f/u ov/Robin Clayton re: GOLD 2 copd /AB maint on symbicort 80 prn  Chief Complaint  Patient presents with   Follow-up    Pt states she has no new issues since LOV.    Dyspnea:  does ok at [employer] pushing cart slow pace, better if pretreats with symbicort despite suboptimal hfa  Cough: sporadic/ dry daytime > noct  Sleeping: on left side, bed is flat / can't tol cpap but feels sleeps fine s it without hypersomnolence  SABA use: neb rarely   02: 2lpm hs and rarely during the day  Lung cancer screening :  per [facility] yearly   Rec Continue symbicort 80 up to  2 puffs first thing in am and then another 2 puffs about 12 hours later.  Work on inhaler technique:   Make sure you check your oxygen saturation at your highest level of activity to be sure it stays over 90%  Please schedule a follow up visit in 6 months but call sooner if needed -  bring your symbicort with you   Had  wt down to 173 in 2023   09/15/2022  f/u ov/Robin Clayton re: GOLD 2 copd/AB with wt gain   maint on symbicort 80 @ 203 w t now   bmi 35  Chief Complaint  Patient presents with   Follow-up    SOB with exertion persistent since last OV.  Dyspnea:  does fining leaning on cart / has trouble walking across parking lots  Cough:  minimal mucoid  Sleeping: bed is flat/ 3 pillows/ can't tol cpap  SABA use: hfa may 3 x per week, rarely neb  02:  2lpm hs/ rarely during the day      No obvious day to day or daytime variability or assoc excess/ purulent sputum or mucus plugs or hemoptysis or cp or chest tightness, subjective wheeze or overt sinus or hb symptoms.   Sleeping as above  without nocturnal  or early am exacerbation  of respiratory  c/o's or need for noct saba. Also denies any obvious fluctuation of symptoms with weather or environmental changes or other aggravating or alleviating factors except as outlined above   No unusual exposure hx or h/o childhood pna/ asthma or knowledge of premature birth.  Current Allergies, Complete Past Medical History, Past Surgical History, Family History, and Social History were reviewed  in [employer] record.  ROS  The following are not active complaints unless bolded Hoarseness, sore throat, dysphagia, dental problems, itching, sneezing,  nasal congestion or discharge of excess mucus or purulent secretions, ear ache,   fever, chills, sweats, unintended wt loss or wt gain, classically pleuritic or exertional cp,  orthopnea pnd or arm/hand swelling  or leg swelling, presyncope, palpitations, abdominal pain, anorexia, nausea, vomiting, diarrhea  or change in bowel habits or change in bladder habits, change in stools or change in urine, dysuria, hematuria,  rash, arthralgias, visual complaints, headache, numbness, weakness or ataxia or problems with walking or coordination,  change in mood or  memory.        Current Meds  Medication Sig    acetaminophen (TYLENOL) 500 MG tablet Take 1,000 mg by mouth every 6 (six) hours as needed for mild pain.   albuterol (PROVENTIL) (2.5 MG/3ML) 0.083% nebulizer solution Take 2.5 mg by nebulization every 4 (four) hours as needed (shortness of breath).    albuterol (VENTOLIN HFA) 108 (90 Base) MCG/ACT inhaler Inhale 2 puffs into the lungs every 6 (six) hours as needed for wheezing.   aspirin 81 MG tablet Take 81 mg by mouth daily.   atorvastatin (LIPITOR) 80 MG tablet TAKE 1 TABLET(80 MG) BY MOUTH DAILY   chlorhexidine (PERIDEX) 0.12 % solution SMARTSIG:By Mouth   cloNIDine (CATAPRES) 0.1 MG tablet Take 0.1 mg by mouth daily. Also takes 0.2mg  at bedtime   cloNIDine (CATAPRES) 0.2 MG tablet Take 0.2 mg by mouth at bedtime. Also takes 0.1mg  once daily   colchicine 0.6 MG tablet Take colchicine 1.2 mg (2 pills) when you pick up your prescription.  You may take 1 more pill (0.6 mg) 1 hour after you take your first dose.  Tomorrow, start taking 1 pill of colchicine in the morning (0.6 mg)  with food for 5 days to fully treat your gout flare.   diltiazem (CARDIZEM CD) 240 MG 24 hr capsule Take 240 mg by mouth at bedtime.   Evolocumab (REPATHA SURECLICK) 140 MG/ML SOAJ Inject 140 mg into the skin every 14 (fourteen) days.   ezetimibe (ZETIA) 10 MG tablet Take 1 tablet (10 mg total) by mouth daily.   FEROSUL 325 (65 Fe) MG tablet TAKE 1 TABLET(325 MG) BY MOUTH EVERY OTHER DAY   hydrochlorothiazide (MICROZIDE) 12.5 MG capsule Take 12.5 mg by mouth daily.   metFORMIN (GLUCOPHAGE) 500 MG tablet Take 500 mg by mouth See admin instructions. 500mg  in the afternoon and 500mg  at bedtime   montelukast (SINGULAIR) 10 MG tablet TAKE 1 TABLET(10 MG) BY MOUTH DAILY   pantoprazole (PROTONIX) 40 MG tablet Take 40 mg by mouth every morning.   sertraline (ZOLOFT) 25 MG tablet Take 25 mg by mouth daily.    SYMBICORT 80-4.5 MCG/ACT inhaler INHALE 2 PUFFS BY MOUTH FIRST THING IN THE MORNING THEN 2 PUFFS BY MOUTH 12 HOURS LATER    valsartan (DIOVAN) 160 MG tablet Take 160 mg by mouth at bedtime.               Objective:   Physical Exam   Wts  09/15/2022       203  03/17/2022     209   03/17/2021     200  04/23/2019     207  03/23/2019     210  12/05/2012         223 > 217 03/05/2013 > 207 07/10/2013 > 01/03/2015   204 > 12/03/2016  190  09/15/12 245 lb (111.131 kg)  05/08/12 253 lb 3.2 oz (114.851 kg)  12/17/11 241 lb 4 oz (109.43 kg)    Vital signs reviewed  09/15/2022  - Note at rest 02 sats  98% on RA   General appearance:    obese pleasant amb bf nad   HEENT : Oropharynx  clear   Nasal turbinates nl    NECK :  without  apparent JVD/ palpable Nodes/TM    LUNGS: no acc muscle use,  Min barrel  contour chest wall with bilateral  slightly decreased bs s audible wheeze and  without cough on insp or exp maneuvers and min  Hyperresonant  to  percussion bilaterally    CV:  RRR  no s3 or murmur or increase in P2, and no edema   ABD:  obese soft and nontender    MS:  Nl gait/ ext warm without deformities Or obvious joint restrictions  calf tenderness, cyanosis or clubbing     SKIN: warm and dry without lesions    NEURO:  alert, approp, nl sensorium with  no motor or cerebellar deficits apparent.              Assessment & Plan:

## 2022-09-15 NOTE — Assessment & Plan Note (Addendum)
Quit smoking  04/11/2019 - ACEi d/c    09/16/2012 due to cough - 12/05/2012 PFTs FEV1 1.22 (58%)  65 and no better p B2,  DLCO 59 and 74% - 03/23/2019   try symb 80 2bid due to prominent hoarseness/ pseudowheezes> improved so stopped all inhalers by f/u 04/23/2019  - PFT's  09/02/20 FEV1 1.09 (56 % ) ratio 0.65  p 2 % improvement from saba p symbicort prior to study with DLCO  11.74 (60%) corrects to 3.49 (82%)  for alv volume and FV curve mildly concave   - 09/15/2022  After extensive coaching inhaler device,  effectiveness =    80% (short ti)    Group D (now reclassified as E) in terms of symptom/risk and laba/lama/ICS  therefore appropriate rx at this point >>>  continue symbicort  80/ add spiriva 2.5 x 2 puffs each am and f/u  in 3 m  - The proper method of use, as well as anticipated side effects, of a  respimat inhaler were discussed and demonstrated to the patient using teach back method. Improved effectiveness after extensive coaching during this visit to a level of approximately 90 % from a baseline of 75 %    Also discussed approp saba using ABC action plan in AVS    Lung cancer screening per [facility] (pt aware)

## 2022-09-15 NOTE — Assessment & Plan Note (Addendum)
Quit smoking 2020 - on CPAP and 02 prn chronically but doesn't use either consistently as of 07/10/2013  - ono RA 07/10/2013 (would prefer to keep 02 if qualifies off cpap) :   desat < 89% x 30 m on RA 07/25/13 so rec continue noct 02  - 09/15/2022   @ wt 203 Walked on RA  x  3  lap(s) =  approx 750  ft  @ brisk pace, stopped due to end of study s sob  with lowest 02 sats 93 %   No need for daytime 02 even with exertion/ ok to continue noct 02 but work harder on wt loss to reduce 02 dep and need for cpap (which she refuses anyway)  Please schedule a follow up visit in 3 months but call sooner if needed   Each maintenance medication was reviewed in detail including emphasizing most importantly the difference between maintenance and prns and under what circumstances the prns are to be triggered using an action plan format where appropriate.  Total time for H and P, chart review, counseling,  directly observing portions of ambulatory 02 saturation study/ and generating customized AVS unique to this office visit / same day charting = 30 min

## 2022-11-08 ENCOUNTER — Other Ambulatory Visit: Payer: Self-pay | Admitting: Nurse Practitioner

## 2022-11-15 ENCOUNTER — Other Ambulatory Visit (HOSPITAL_COMMUNITY): Payer: Medicare Other

## 2022-12-01 ENCOUNTER — Ambulatory Visit (HOSPITAL_COMMUNITY)
Admission: RE | Admit: 2022-12-01 | Discharge: 2022-12-01 | Disposition: A | Discharge status: Home / Self Care | Payer: Medicare Other | Source: Ambulatory Visit | Attending: Obstetrics and Gynecology | Admitting: Obstetrics and Gynecology

## 2022-12-01 DIAGNOSIS — N838 Other noninflammatory disorders of ovary, fallopian tube and broad ligament: Secondary | ICD-10-CM | POA: Insufficient documentation

## 2022-12-15 NOTE — Progress Notes (Unsigned)
Subjective:    Patient ID: Robin Clayton, female    DOB: 05/03/1956  MRN: [id number]   Brief patient profile:  22  yobf  Quit smoking completely 04/11/2019  no problems until around 2000 referred to pulmonary clinic 05/08/2012 by Dr Asencion Partridge with baseline wt 150 but freq steroids required for flares of AB  to wt 253 at first pulmonary clinic ov and documented GOLD II copd 12/05/12    History of Present Illness  05/08/2012 1st ov/ / cc 10 years of intermittent cough and sob responsive to short courses of prednisone progressively worse indolent onset doe even in absence of cough to point where sob across the parking lot then developed cp comes goes x 2 months lasting up to a half a day unless takes vicodin. Location is ant, generalized, assoc with overt HB, non-radiating, no worse walking. Coughing does not make it worse. rec Continue symbicort 160 Take 2 puffs first thing in am and then another 2 puffs about 12 hours later.  Pantoprazole 40 mg Take 30-60 min before first meal of the day  GERD  diet     03/17/2022  yearly  f/u ov/ re: GOLD 2 copd /AB maint on symbicort 80 prn  Chief Complaint  Patient presents with   Follow-up    Pt states she has no new issues since LOV.    Dyspnea:  does ok at [employer] pushing cart slow pace, better if pretreats with symbicort despite suboptimal hfa  Cough: sporadic/ dry daytime > noct  Sleeping: on left side, bed is flat / can't tol cpap but feels sleeps fine s it without hypersomnolence  SABA use: neb rarely   02: 2lpm hs and rarely during the day  Lung cancer screening :  per [facility] yearly   Rec Continue symbicort 80 up to  2 puffs first thing in am and then another 2 puffs about 12 hours later.  Work on inhaler technique:   Make sure you check your oxygen saturation at your highest level of activity to be sure it stays over 90%  Please schedule a follow up visit in 6 months but call sooner if needed -  bring your symbicort with you    Had wt down to 173 in 2023   09/15/2022  f/u ov/ re: GOLD 2 copd/AB with wt gain   maint on symbicort 80 @ 203 w t now   bmi 35  Chief Complaint  Patient presents with   Follow-up    SOB with exertion persistent since last OV.  Dyspnea:  does fining leaning on cart / has trouble walking across parking lots  Cough:  minimal mucoid  Sleeping: bed is flat/ 3 pillows/ can't tol cpap  SABA use: hfa may 3 x per week, rarely neb  02:  2lpm hs/ rarely during the day  Rec Plan A = Automatic = Always=    Symbicort 80 Take 2 puffs first thing in am and then another 2 puffs about 12 hours later and follow the am doses of symbicort with spiriva 2 puffs just in am  Work on inhaler technique:   Plan B = Backup (to supplement plan A, not to replace it) Only use your albuterol inhaler as a rescue medication   Plan C = Crisis (instead of Plan B but only if Plan B stops working) - only use your albuterol nebulizer if you first try Plan B     Please schedule a follow up visit in 3  months but call sooner if needed - bring inhalers   Late add consider change to stiolto if responds to adding spiriva to symbicort 80 and no tendency to aecopd identified in meantime.     12/16/2022  f/u ov/ re: ***   maint on *** did *** bring inhalers  No chief complaint on file.   Dyspnea:  *** Cough: *** Sleeping: *** SABA use: *** 02: *** Covid status:   *** Lung cancer screening :  ***    No obvious day to day or daytime variability or assoc excess/ purulent sputum or mucus plugs or hemoptysis or cp or chest tightness, subjective wheeze or overt sinus or hb symptoms.   *** without nocturnal  or early am exacerbation  of respiratory  c/o's or need for noct saba. Also denies any obvious fluctuation of symptoms with weather or environmental changes or other aggravating or alleviating factors except as outlined above   No unusual exposure hx or h/o childhood pna/ asthma or knowledge of premature  birth.  Current Allergies, Complete Past Medical History, Past Surgical History, Family History, and Social History were reviewed in [employer] record.  ROS  The following are not active complaints unless bolded Hoarseness, sore throat, dysphagia, dental problems, itching, sneezing,  nasal congestion or discharge of excess mucus or purulent secretions, ear ache,   fever, chills, sweats, unintended wt loss or wt gain, classically pleuritic or exertional cp,  orthopnea pnd or arm/hand swelling  or leg swelling, presyncope, palpitations, abdominal pain, anorexia, nausea, vomiting, diarrhea  or change in bowel habits or change in bladder habits, change in stools or change in urine, dysuria, hematuria,  rash, arthralgias, visual complaints, headache, numbness, weakness or ataxia or problems with walking or coordination,  change in mood or  memory.        No outpatient medications have been marked as taking for the 12/16/22 encounter (Appointment) with Nyoka Cowden, MD.                 Objective:   Physical Exam   Wts  12/16/2022       ***  09/15/2022       203  03/17/2022     209   03/17/2021     200  04/23/2019     207  03/23/2019     210  12/05/2012         223 > 217 03/05/2013 > 207 07/10/2013 > 01/03/2015   204 > 12/03/2016  190              09/15/12 245 lb (111.131 kg)  05/08/12 253 lb 3.2 oz (114.851 kg)  12/17/11 241 lb 4 oz (109.43 kg)     Vital signs reviewed  12/16/2022  - Note at rest 02 sats  ***% on ***   General appearance:    ***   Min barr ***        Assessment & Plan:

## 2022-12-16 ENCOUNTER — Ambulatory Visit (INDEPENDENT_AMBULATORY_CARE_PROVIDER_SITE_OTHER): Payer: Medicare Other | Admitting: Internal Medicine

## 2022-12-16 ENCOUNTER — Encounter: Payer: Self-pay | Admitting: Internal Medicine

## 2022-12-16 VITALS — BP 118/64 | HR 70 | Temp 98.3°F | Ht 63.0 in | Wt 205.4 lb

## 2022-12-16 DIAGNOSIS — G4734 Idiopathic sleep related nonobstructive alveolar hypoventilation: Secondary | ICD-10-CM

## 2022-12-16 DIAGNOSIS — J449 Chronic obstructive pulmonary disease, unspecified: Secondary | ICD-10-CM

## 2022-12-16 DIAGNOSIS — R058 Other specified cough: Secondary | ICD-10-CM

## 2022-12-16 MED ORDER — ALBUTEROL SULFATE HFA 108 (90 BASE) MCG/ACT IN AERS: 2.0000 | INHALATION_SPRAY | Freq: Four times a day (QID) | RESPIRATORY_TRACT | 11 refills | Status: DC | PRN

## 2022-12-16 NOTE — Assessment & Plan Note (Signed)
Quit smoking 2020 - on CPAP and 02 prn chronically but doesn't use either consistently as of 07/10/2013  - ono RA 07/10/2013 (would prefer to keep 02 if qualifies off cpap) :   desat < 89% x 30 m on RA 07/25/13 so rec continue noct 02  - 09/15/2022   @ wt 203 Walked on RA  x  3  lap(s) =  approx 750  ft  @ brisk pace, stopped due to end of study s sob  with lowest 02 sats 93 %   No need for amb 02 but needs to do submax ex to help get into net cal balance  Advised: To get the most out of exercise, you need to be continuously aware that you are short of breath, but never out of breath, for at least 30 minutes daily. As you improve, it will actually be easier for you to do the same amount of exercise  in  30 minutes so always push to the level where you are short of breath.

## 2022-12-16 NOTE — Assessment & Plan Note (Signed)
ACEi d/c    09/16/2012 due to cough - Added  back daily ppi as of 12/03/2016  - flared 12/2018 >>  reduced symb to 80 2bid 03/23/2019 and max rx for gerd  > changed to just pepcid 20 mg p bfast 04/23/2019 due to belching on PPI   - referred to ENT 12/16/2022 >>>   Discussed in detail all the  indications, usual  risks and alternatives  relative to the benefits with patient who agrees to proceed with w/u as outlined.            Each maintenance medication was reviewed in detail including emphasizing most importantly the difference between maintenance and prns and under what circumstances the prns are to be triggered using an action plan format where appropriate.  Total time for H and P, chart review, counseling, reviewing hfa/ neb/02  device(s) and generating customized AVS unique to this office visit / same day charting  > 30 min for multiple  refractory respiratory  symptoms of uncertain etiology

## 2022-12-16 NOTE — Patient Instructions (Addendum)
To get the most out of exercise, you need to be continuously aware that you are short of breath, but never out of breath, for at least 30 minutes daily. As you improve, it will actually be easier for you to do the same amount of exercise  in  30 minutes so always push to the level where you are short of breath.     Also  Ok to try albuterol x 2 puffs (or nebulizer) 15 min before an activity (on alternating days)  that you know would usually make you short of breath and see if it makes any difference and if makes none then don't take albuterol after activity unless you can't catch your breath as this means it's the resting that helps, not the albuterol.  My office will be contacting you by phone for referral to ENT regarding your throat  - if you don't hear back from my office within one week please call us back or notify us thru MyChart and we'll address it right away.    Please schedule a follow up visit in 3 months but call sooner if needed

## 2022-12-16 NOTE — Assessment & Plan Note (Signed)
Quit smoking  04/11/2019 - ACEi d/c    09/16/2012 due to cough - 12/05/2012 PFTs FEV1 1.22 (58%)  64 and no better p B2,  DLCO 59 and 74% - 03/23/2019   try symb 80 2bid due to prominent hoarseness/ pseudowheezes> improved so stopped all inhalers by f/u 04/23/2019  - PFT's  09/02/20 FEV1 1.09 (56 % ) ratio 0.65  p 2 % improvement from saba p symbicort prior to study with DLCO  11.74 (60%) corrects to 3.49 (82%)  for alv volume and FV curve mildly concave   - 09/15/2022  added spiriva 2.5 > no better doe so d/c  - 12/16/2022  After extensive coaching inhaler device,  effectiveness =    75% continue symbicort 80   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

## 2022-12-22 ENCOUNTER — Encounter: Payer: Self-pay | Admitting: Obstetrics and Gynecology

## 2022-12-22 ENCOUNTER — Ambulatory Visit (INDEPENDENT_AMBULATORY_CARE_PROVIDER_SITE_OTHER): Payer: Medicare Other | Admitting: Obstetrics and Gynecology

## 2022-12-22 VITALS — BP 129/59 | HR 72 | Ht 63.0 in | Wt 207.0 lb

## 2022-12-22 DIAGNOSIS — N838 Other noninflammatory disorders of ovary, fallopian tube and broad ligament: Secondary | ICD-10-CM

## 2022-12-22 DIAGNOSIS — R9389 Abnormal findings on diagnostic imaging of other specified body structures: Secondary | ICD-10-CM | POA: Diagnosis not present

## 2022-12-22 NOTE — Progress Notes (Signed)
67 y.o. GYN presents for [place] follow up.

## 2022-12-22 NOTE — Progress Notes (Signed)
Robin Clayton presents for F/U of GYN U/S GYN U/S results reviewed with pt Ovarian mass unchanged Pt has no GYN complaints Denies any bowel or bladder dysfunction No PMB  Pap smear UTD  PE AF VSS Lungs clear Heart RRR Abd soft + BS  A/P Ovarian mass        Thicken endometrium on  [place]  W/U for ovarian mass has been benign and suspect remains benign as no U/S change Will switch to yr GYN U/S. H/O PMB in the past related to endometrial polyp on EMBX. No further PMB since. Will follow for now. Pt advised to contact the office for any vaginal bleeding

## 2023-01-06 ENCOUNTER — Encounter: Payer: Self-pay | Admitting: Cardiology

## 2023-01-06 ENCOUNTER — Ambulatory Visit: Payer: Medicare Other | Attending: Cardiology | Admitting: Cardiology

## 2023-01-06 VITALS — BP 120/50 | HR 52 | Ht 63.0 in | Wt 205.6 lb

## 2023-01-06 DIAGNOSIS — I10 Essential (primary) hypertension: Secondary | ICD-10-CM | POA: Diagnosis present

## 2023-01-06 DIAGNOSIS — I2510 Atherosclerotic heart disease of native coronary artery without angina pectoris: Secondary | ICD-10-CM

## 2023-01-06 DIAGNOSIS — E7800 Pure hypercholesterolemia, unspecified: Secondary | ICD-10-CM | POA: Diagnosis present

## 2023-01-06 DIAGNOSIS — G4733 Obstructive sleep apnea (adult) (pediatric): Secondary | ICD-10-CM | POA: Diagnosis present

## 2023-01-06 DIAGNOSIS — I4719 Other supraventricular tachycardia: Secondary | ICD-10-CM

## 2023-01-06 NOTE — Patient Instructions (Signed)
Medication Instructions:  Your physician recommends that you continue on your current medications as directed. Please refer to the Current Medication list given to you today.  *If you need a refill on your cardiac medications before your next appointment, please call your pharmacy*   Lab Work: None.  If you have labs (blood work) drawn today and your tests are completely normal, you will receive your results only by: MyChart Message (if you have MyChart) OR A paper copy in the mail If you have any lab test that is abnormal or we need to change your treatment, we will call you to review the results.   Testing/Procedures: Your physician has recommended that you have a sleep study. This test records several body functions during sleep, including: brain activity, eye movement, oxygen and carbon dioxide blood levels, heart rate and rhythm, breathing rate and rhythm, the flow of air through your mouth and nose, snoring, body muscle movements, and chest and belly movement.    Follow-Up:    Your next appointment will be dependent on the results of your sleep study and it will be with:     Provider:   Armanda Magic, MD

## 2023-01-06 NOTE — Addendum Note (Signed)
Addended by: Luellen Pucker on: 01/06/2023 09:24 AM   Modules accepted: Orders

## 2023-01-06 NOTE — Progress Notes (Signed)
Date:  01/06/2023   ID:  Robin Clayton, DOB [birth year], MRN [id number]   PCP:  Tracey Harries, MD  Cardiologist:  Armanda Magic, MD  Electrophysiologist:  None   Chief Complaint:  OSA, HTN  History of Present Illness:      Robin Clayton is a 67 y.o. 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 She has history of palpitations with event monitor in 10/2017 demonstrating nonsustained atrial tachycardia up to 25 beats, sinus bradycardia to sinus tachycardia (30->130bpm), transient complete heart block with ventricular standstill for up to 4 seconds.   She was intolerant to CPAP and was on nocturnal O2.  She could not afford the oral device and refused the Inspire device.  She was interested in getting back on PAP so we placed her back on auto settings from 4 to 18cm H2o.   Dr. 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  She retried CPAP but could not tolerate it and I referred her back to Dr. Toni Arthurs per her request for oral device. She decided that she did not want to go to Dr. Toni Arthurs.  She then went to a health program at [facility] and lost significant weight and her PCP told her she no longer needed CPAP based on follow-ups sleep study. She has been using O2 at night for many years ( she does not know how she got on it).  Recently she gained all her weight back and her DME told her she could not have O2 at night until she had another sleep study done.,   Coronary CTA 12/03/2021 demonstrated  CAC score 792 (98 percentile), distal LCx severe plaque (>70), distal RCA moderate plaque (50-69), LAD minimal plaque (<25), aortic atherosclerosis; FFR normal-consistent with nonobstructive coronary artery disease.    She was seen back by Tereso Newcomer in March 2024 for and was doing well.   She was not having any anginal symptoms at that time.  Due to a heart murmur noted on exam a 2D echo was done which demonstrated EF 60 to 65% with G1 DD and moderate aortic valve sclerosis with no stenosis.  And mean aortic valve gradient 5 mmHg.  She is here today for followup and is doing well.  She has chronic SOB related to COPD that is stable. She denies any chest pain or pressure, PND, orthopnea, LE edema, dizziness, palpitations or syncope. She is compliant with her meds and is tolerating meds with no SE.    Prior CV studies:   The following studies were reviewed today:  None  Past Medical History:  Diagnosis Date   Anemia    Anxiety    Atrial tachycardia    Bradycardia    Chronic bronchitis (HCC)    Chronic lower back pain    CKD (chronic kidney disease), stage III (HCC)    Complete heart block (HCC)    a. event monitor in 10/2017 demonstrating nonsustained atrial tachycardia up to 25 beats, sinus bradycardia to sinus tachycardia (30->130bpm), transient complete heart block with ventricular standstill for up to 4 seconds.   Constipation    COPD (chronic obstructive pulmonary disease) (HCC)    GERD (gastroesophageal reflux disease)    takes tums occ   Hyperlipidemia    Hypertension    Morbid obesity (HCC)  Obstructive sleep apnea    moderate OSA with AHI 24/hr intolerant to CPAP and could not afford oral device   Sleep apnea    Type II diabetes mellitus (HCC)    Past Surgical History:  Procedure Laterality Date   BREAST BIOPSY  12/06/2011   Procedure: BREAST BIOPSY WITH NEEDLE LOCALIZATION;  Surgeon: Emelia Loron, MD;  Location: [facility] OR;  Service: General;  Laterality: Left;   CESAREAN SECTION  1970   JOINT REPLACEMENT     REVISION TOTAL HIP ARTHROPLASTY Bilateral      Current Meds  Medication Sig   acetaminophen (TYLENOL) 500 MG tablet Take 1,000 mg by mouth every 6 (six) hours as needed for mild pain.   albuterol (PROVENTIL) (2.5 MG/3ML) 0.083% nebulizer  solution Take 2.5 mg by nebulization every 4 (four) hours as needed (shortness of breath).    albuterol (VENTOLIN HFA) 108 (90 Base) MCG/ACT inhaler Inhale 2 puffs into the lungs every 6 (six) hours as needed for wheezing.   aspirin 81 MG tablet Take 81 mg by mouth daily.   atorvastatin (LIPITOR) 80 MG tablet TAKE 1 TABLET(80 MG) BY MOUTH DAILY   chlorhexidine (PERIDEX) 0.12 % solution SMARTSIG:By Mouth   cloNIDine (CATAPRES) 0.1 MG tablet Take 0.1 mg by mouth daily. Also takes 0.2mg  at bedtime   colchicine 0.6 MG tablet Take colchicine 1.2 mg (2 pills) when you pick up your prescription.  You may take 1 more pill (0.6 mg) 1 hour after you take your first dose.  Tomorrow, start taking 1 pill of colchicine in the morning (0.6 mg)  with food for 5 days to fully treat your gout flare.   diltiazem (CARDIZEM CD) 240 MG 24 hr capsule Take 240 mg by mouth at bedtime.   Evolocumab (REPATHA SURECLICK) 140 MG/ML SOAJ Inject 140 mg into the skin every 14 (fourteen) days.   ezetimibe (ZETIA) 10 MG tablet TAKE 1 TABLET(10 MG) BY MOUTH DAILY   FEROSUL 325 (65 Fe) MG tablet TAKE 1 TABLET(325 MG) BY MOUTH EVERY OTHER DAY   hydrochlorothiazide (MICROZIDE) 12.5 MG capsule Take 12.5 mg by mouth daily.   metFORMIN (GLUCOPHAGE) 500 MG tablet Take 500 mg by mouth See admin instructions. 500mg  in the afternoon and 500mg  at bedtime   montelukast (SINGULAIR) 10 MG tablet TAKE 1 TABLET(10 MG) BY MOUTH DAILY   pantoprazole (PROTONIX) 40 MG tablet Take 40 mg by mouth every morning.   sertraline (ZOLOFT) 25 MG tablet Take 25 mg by mouth daily.    SYMBICORT 80-4.5 MCG/ACT inhaler INHALE 2 PUFFS BY MOUTH FIRST THING IN THE MORNING THEN 2 PUFFS BY MOUTH 12 HOURS LATER   valsartan (DIOVAN) 160 MG tablet Take 160 mg by mouth at bedtime.   [DISCONTINUED] cloNIDine (CATAPRES) 0.2 MG tablet Take 0.2 mg by mouth at bedtime. Also takes 0.1mg  once daily     Allergies:   Wound dressings, Electrodes 25mm [tens therapy replace back  pads], and Wound dressing adhesive   Social History   Tobacco Use   Smoking status: Former    Current packs/day: 0.00    Average packs/day: 0.5 packs/day for 38.0 years (19.0 ttl pk-yrs)    Types: Cigarettes    Start date: 07/02/1982    Quit date: 07/01/2020    Years since quitting: 2.5   Smokeless tobacco: Never  Vaping Use   Vaping status: Never Used  Substance Use Topics   Alcohol use: No   Drug use: No     Family Hx: The patient's family history  includes Asthma in her sister; Cancer in her mother; Diabetes in her father and mother; Heart attack in her father; Hyperlipidemia in her mother; Hypertension in her father and mother; Kidney disease in her father and sister; Stomach cancer (age of onset: 34) in her mother. There is no history of Colon cancer, Colon polyps, Esophageal cancer, or Rectal cancer.  ROS:   Please see the history of present illness.     All other systems reviewed and are negative.   Labs/Other Tests and Data Reviewed:    Recent Labs: 01/18/2022: ALT 21 08/29/2022: BUN 19; Creatinine, Ser 1.37; Hemoglobin 11.7; Platelets 231; Potassium 4.2; Sodium 136   Recent Lipid Panel Lab Results  Component Value Date/Time   CHOL 96 (L) 05/06/2022 08:25 AM   TRIG 124 05/06/2022 08:25 AM   HDL 36 (L) 05/06/2022 08:25 AM   CHOLHDL 2.7 05/06/2022 08:25 AM   CHOLHDL 3.2 01/05/2010 03:55 AM   LDLCALC 38 05/06/2022 08:25 AM    Wt Readings from Last 3 Encounters:  01/06/23 205 lb 9.6 oz (93.3 kg)  12/22/22 207 lb (93.9 kg)  12/16/22 205 lb 6.4 oz (93.2 kg)     Objective:    Vital Signs:  BP (!) 120/50   Pulse (!) 52   Ht 5\' 3"  (1.6 m)   Wt 205 lb 9.6 oz (93.3 kg)   LMP 04/30/2012   SpO2 92%   BMI 36.42 kg/m    GEN: Well nourished, well developed in no acute distress HEENT: Normal NECK: No JVD; No carotid bruits LYMPHATICS: No lymphadenopathy CARDIAC:RRR, no murmurs, rubs, gallops RESPIRATORY:  Clear to auscultation without rales, wheezing or rhonchi   ABDOMEN: Soft, non-tender, non-distended MUSCULOSKELETAL:  No edema; No deformity  SKIN: Warm and dry NEUROLOGIC:  Alert and oriented x 3 PSYCHIATRIC:  Normal affect  ASSESSMENT & PLAN:     OSA -she retried the PAP but could not tolerate it -she did not want the Inspire device. -she did not want an oral device -she was in a health program through [place] and had lost significant weight and her PCP told her her sleep study showed she did not need CPAP -She has been using O2 at night for many years ( she does not know how she got on it).  Recently she gained all her weight back and her DME told her she could not have O2 at night until she had another sleep study done. -I will get a split night sleep study  HTN -BP adequately controlled on exam today -Continue prescription drug management with clonidine 0.1 mg every morning and 0.2 mg every afternoon, Cardizem CD 240 mg daily, HCTZ 12.5 mg daily and Diovan 160 mg daily with as needed refills. -I have personally reviewed and interpreted outside labs performed by patient's PCP which showed serum creatinine 1.37 and potassium 4.2 on 08/09/2022 which is stable  Nonsustained atrial tachycardia -she is maintaining NSR on exam and denies any palpitations -continue Cardizem  ASCAD -coronary Ca score done for SOB -Coronary CTA done 2023 showed CAC score 792 (98 percentile), distal LCx severe plaque (>70), distal RCA moderate plaque (50-69), LAD minimal plaque (<25), aortic atherosclerosis; FFR normal-consistent with nonobstructive coronary artery disease  -She denies any chest pain or shortness of breath -Continue prescription drug management with aspirin 81 mg daily, high-dose statin therapy with as needed refills  HLD -LDL goal < 70 due to DM and coronary Ca -I have personally reviewed and interpreted outside labs performed by patient's PCP which showed  LDL 38 and HDL 36 on 05/06/2022 -Continue prescription drug management with atorvastatin 80 g  daily, Zetia 10 mg daily and Repatha with as needed refills   Medication Adjustments/Labs and Tests Ordered: Current medicines are reviewed at length with the patient today.  Concerns regarding medicines are outlined above.  Tests Ordered: No orders of the defined types were placed in this encounter.  Medication Changes: No orders of the defined types were placed in this encounter.   Disposition:  Follow up 1 year  Signed, Armanda Magic, MD  01/06/2023 9:15 AM    [facility]

## 2023-01-24 ENCOUNTER — Ambulatory Visit (INDEPENDENT_AMBULATORY_CARE_PROVIDER_SITE_OTHER): Payer: Medicare Other | Admitting: Otolaryngology

## 2023-01-24 ENCOUNTER — Encounter (INDEPENDENT_AMBULATORY_CARE_PROVIDER_SITE_OTHER): Payer: Self-pay | Admitting: Otolaryngology

## 2023-01-24 VITALS — BP 123/68 | Ht 63.0 in | Wt 205.2 lb

## 2023-01-24 DIAGNOSIS — J398 Other specified diseases of upper respiratory tract: Secondary | ICD-10-CM | POA: Diagnosis not present

## 2023-01-24 DIAGNOSIS — J9611 Chronic respiratory failure with hypoxia: Secondary | ICD-10-CM

## 2023-01-24 DIAGNOSIS — G4733 Obstructive sleep apnea (adult) (pediatric): Secondary | ICD-10-CM

## 2023-01-24 DIAGNOSIS — J449 Chronic obstructive pulmonary disease, unspecified: Secondary | ICD-10-CM | POA: Diagnosis not present

## 2023-01-24 DIAGNOSIS — R061 Stridor: Secondary | ICD-10-CM | POA: Diagnosis not present

## 2023-01-24 NOTE — Progress Notes (Signed)
Otolaryngology Clinic Note Referring physician: Dr. Sherene Sires HPI:  Robin Clayton is a 67 y.o. female kindly referred by Dr. Sherene Sires for evaluation of upper airway cough syndrome v/s airway stenosis.  Patient reports that her Pulmonologist referred her to [place] for evaluation due to "making a high pitched noise while breathing." She reports that she has this problem for several years and reports it has been stable. Current symptoms include Shortness of breathing while walking or with significant activity. She reports it is annoying but reports it does not interfere with her activity most of the time. No changes in voice/hoarseness; no significant trouble with swallowing.  Cough is non-productive and rare, but it is not bothersome at all.   She denies recent or no reports of traumatic intubations.   She quit smoking in 2020. Last COPD flare ups was "years ago". No recent antibiotics. She has OSA and uses O2 at nighttime. Can't tolerate CPAP. Not interested in [place]. She has a repeat sleep study pending.   Quit smoking 04/2019, on inhalers.  PMHx: Chronic Bronchitis/COPD, HTN, h/o Smoking, CKD, Herat Block, GERD, OSA (AHI 24/hr, on 2L O2 at night), DM, Obesity, On ASA 81  Used to work in Therapist, occupational.   PMH/Meds/All/SocHx/FamHx/ROS:   Past Medical History:  Diagnosis Date   Anemia    Anxiety    Atrial tachycardia    Bradycardia    Chronic bronchitis (HCC)    Chronic lower back pain    CKD (chronic kidney disease), stage III (HCC)    Complete heart block (HCC)    a. event monitor in 10/2017 demonstrating nonsustained atrial tachycardia up to 25 beats, sinus bradycardia to sinus tachycardia (30->130bpm), transient complete heart block with ventricular standstill for up to 4 seconds.   Constipation    COPD (chronic obstructive pulmonary disease) (HCC)    GERD (gastroesophageal reflux disease)    takes tums occ   Hyperlipidemia    Hypertension    Morbid obesity (HCC)    Obstructive sleep apnea     moderate OSA with AHI 24/hr intolerant to CPAP and could not afford oral device   Sleep apnea    Type II diabetes mellitus (HCC)     Past Surgical History:  Procedure Laterality Date   BREAST BIOPSY  12/06/2011   Procedure: BREAST BIOPSY WITH NEEDLE LOCALIZATION;  Surgeon: Emelia Loron, MD;  Location: [facility] OR;  Service: General;  Laterality: Left;   CESAREAN SECTION  1970   JOINT REPLACEMENT     REVISION TOTAL HIP ARTHROPLASTY Bilateral    Denies history of head or neck surgery  Family History  Problem Relation Age of Onset   Stomach cancer Mother 56   Hypertension Mother    Hyperlipidemia Mother    Diabetes Mother    Cancer Mother        stomach   Kidney disease Father    Hypertension Father    Diabetes Father    Heart attack Father    Kidney disease Sister    Asthma Sister    Colon cancer Neg Hx    Colon polyps Neg Hx    Esophageal cancer Neg Hx    Rectal cancer Neg Hx    No family history of bleeding disorders or difficulty with anesthesia  Social Connections: Socially Integrated (06/15/2022)   Received from [facility]   Social Network    How would you rate your social network (family, work, friends)?: Good participation with social networks      Current Outpatient Medications:  acetaminophen (TYLENOL) 500 MG tablet, Take 1,000 mg by mouth every 6 (six) hours as needed for mild pain., Disp: , Rfl:    albuterol (PROVENTIL) (2.5 MG/3ML) 0.083% nebulizer solution, Take 2.5 mg by nebulization every 4 (four) hours as needed (shortness of breath). , Disp: , Rfl: 3   aspirin 81 MG tablet, Take 81 mg by mouth daily., Disp: , Rfl:    atorvastatin (LIPITOR) 80 MG tablet, TAKE 1 TABLET(80 MG) BY MOUTH DAILY, Disp: 90 tablet, Rfl: 3   chlorhexidine (PERIDEX) 0.12 % solution, SMARTSIG:By Mouth, Disp: , Rfl:    cloNIDine (CATAPRES) 0.1 MG tablet, Take 0.1 mg by mouth daily. Also takes 0.2mg  at bedtime, Disp: , Rfl:    colchicine 0.6 MG tablet, Take  colchicine 1.2 mg (2 pills) when you pick up your prescription.  You may take 1 more pill (0.6 mg) 1 hour after you take your first dose.  Tomorrow, start taking 1 pill of colchicine in the morning (0.6 mg)  with food for 5 days to fully treat your gout flare., Disp: 8 tablet, Rfl: 0   diltiazem (CARDIZEM CD) 240 MG 24 hr capsule, Take 240 mg by mouth at bedtime., Disp: , Rfl:    Evolocumab (REPATHA SURECLICK) 140 MG/ML SOAJ, Inject 140 mg into the skin every 14 (fourteen) days., Disp: 2 mL, Rfl: 11   hydrochlorothiazide (MICROZIDE) 12.5 MG capsule, Take 12.5 mg by mouth daily., Disp: , Rfl:    metFORMIN (GLUCOPHAGE) 500 MG tablet, Take 500 mg by mouth See admin instructions. 500mg  in the afternoon and 500mg  at bedtime, Disp: , Rfl:    montelukast (SINGULAIR) 10 MG tablet, TAKE 1 TABLET(10 MG) BY MOUTH DAILY, Disp: 30 tablet, Rfl: 11   pantoprazole (PROTONIX) 40 MG tablet, Take 40 mg by mouth every morning., Disp: , Rfl:    sertraline (ZOLOFT) 25 MG tablet, Take 25 mg by mouth daily. , Disp: , Rfl:    SYMBICORT 80-4.5 MCG/ACT inhaler, INHALE 2 PUFFS BY MOUTH FIRST THING IN THE MORNING THEN 2 PUFFS BY MOUTH 12 HOURS LATER, Disp: 10.2 g, Rfl: 11   valsartan (DIOVAN) 160 MG tablet, Take 160 mg by mouth at bedtime., Disp: , Rfl:    albuterol (VENTOLIN HFA) 108 (90 Base) MCG/ACT inhaler, Inhale 2 puffs into the lungs every 6 (six) hours as needed for wheezing. (Patient not taking: Reported on 01/24/2023), Disp: 18 g, Rfl: 11   ezetimibe (ZETIA) 10 MG tablet, TAKE 1 TABLET(10 MG) BY MOUTH DAILY, Disp: 90 tablet, Rfl: 3   FEROSUL 325 (65 Fe) MG tablet, TAKE 1 TABLET(325 MG) BY MOUTH EVERY OTHER DAY, Disp: 30 tablet, Rfl: 11  A 20-point ROS was performed with pertinent positives/negatives noted in the HPI   Physical Exam:   BP 123/68 (BP Location: Left Arm, Patient Position: Sitting, Cuff Size: Normal)   Ht 5\' 3"  (1.6 m)   Wt 205 lb 3.2 oz (93.1 kg)   LMP 04/30/2012   BMI 36.35 kg/m   Salient  findings:  CN II-XII intact Bilateral EAC clear and TM intact with well pneumatized middle ear spaces Anterior rhinoscopy: Septum deviated right; bilateral inferior turbinates with mild hypertrophy No lesions of oral cavity/oropharynx; most dentition missing with edentulous maxillary dentition No obviously palpable neck masses/lymphadenopathy/thyromegaly Auscultation of Lungs and neck does NOT reveal obvious expiratory or inspiratory stridor while she is at rest; there is intermittent breaths where I do hear mild expiratory stridor but this is inconsistent; no wheezing noted Voice quality class II No respiratory  distress, including while laying flat  Independent Review of Additional Tests or Records:  Cardiology, Pulm notes reviewed independently; no recent neck imaging for review; PFTs reviewed from 2022  Procedures:  Procedure Note Pre-procedure diagnosis: Concern for airway stenosis Post-procedure diagnosis: Same Procedure: Transnasal Fiberoptic Laryngoscopy, CPT 31575 - Mod 25 Indication: see above; stridor Complications: None apparent EBL: 0 mL Date: 01/24/23   The procedure was undertaken to further evaluate the patient's complaint of concern for airway stenosis and stridor, with mirror exam inadequate for appropriate examination due to gag reflex and poor patient tolerance  Procedure:  Patient was identified as correct patient. Verbal consent was obtained. The nose was sprayed with oxymetazoline and 4% lidocaine. The The flexible laryngoscope was passed through the nose to view the nasal cavity, pharynx (oropharynx, hypopharynx) and larynx.  The larynx was examined at rest and during multiple phonatory tasks. Documentation was obtained and reviewed with patient. The scope was removed. The patient tolerated the procedure well.  Findings: The nasal cavity and nasopharynx did not reveal any masses or lesions, mucosa appeared to be without obvious lesions. The tongue base, pharyngeal  walls, piriform sinuses, vallecula, epiglottis and postcricoid region are normal in appearance without obvious masses The visualized portion of the subglottis and proximal trachea is patent, though query is some mild (<10% stenosis? - posterior portion over cricoid does not look as crisp). Otherwise the vocal folds are mobile bilaterally, no obvious webs noted. There are no lesions on the free edge of the vocal folds nor elsewhere in the larynx worrisome for malignancy. There is some Reinke's edema and bilateral sulcus vocalis but it is not obstructive (the former)        Electronically signed by: Read Drivers, MD 01/24/2023 1:35 PM   Impression & Plans:  Cacey Rusinko is a 67 y.o. female with complex medical history including COPD seen at the kind referral by Dr. Sherene Sires for concern for airway stenosis. Her history and exam is consistent with mild stenosis, though Ddx: also includes tracheomalacia or bronchomalacia in her case as well as some reinke's edema (though I doubt this contributes much to her symptoms). Interestingly, her stridor is not consistent and I am unable to hear it in her neck even when she has some noisy breathing. COPD-related component could also be contributing. We discussed her options, including R/B/A for each option. She would like to avoid any procedures in OR if possible, which is reasonable. As such, we will opt for follow up and perform in-office bronchoscopy at next visit  Airway stenosis COPD - Continue current pulm inhalers - quite mild currently; after discussion, will observe and f/u in ~3-4 months with in office bronchoscopy at that time. Patient is amenable. Return precautions discussed including worsening breathing; avoid intubation if possible, but should she require intubation, would recommend intubation with a 6-0 or smaller ETT - Note: I do not think this is PVFM/ILO given her history  3. OSA intolerant to CPAP - briefly discussed [facility] but she is not  interested. Will f/u ordered sleep study at next visit.  - continue care per pulm  MDM:  Level 4: 99204 Complexity/Problems addressed: mod Data complexity: mod - Morbidity: mod?  Thank you for allowing me the opportunity to care for your patient. Please do not hesitate to contact me should you have any other questions.  Sincerely, Jovita Kussmaul, MD Otolarynoglogist (ENT), [facility] Phone: [phone] Fax: [phone]  01/24/2023, 1:35 PM

## 2023-02-01 ENCOUNTER — Telehealth: Payer: Self-pay | Admitting: Cardiology

## 2023-02-01 NOTE — Telephone Encounter (Signed)
Patient called to follow-up on being scheduled for a sleep study.

## 2023-02-09 ENCOUNTER — Emergency Department (HOSPITAL_COMMUNITY)
Admission: EM | Admit: 2023-02-09 | Discharge: 2023-02-09 | Disposition: A | Discharge status: Home / Self Care | Payer: Medicare Other | Attending: Emergency Medicine | Admitting: Emergency Medicine

## 2023-02-09 ENCOUNTER — Encounter (HOSPITAL_COMMUNITY): Payer: Self-pay

## 2023-02-09 ENCOUNTER — Emergency Department (HOSPITAL_COMMUNITY): Payer: Medicare Other

## 2023-02-09 ENCOUNTER — Other Ambulatory Visit: Payer: Self-pay

## 2023-02-09 DIAGNOSIS — N189 Chronic kidney disease, unspecified: Secondary | ICD-10-CM | POA: Insufficient documentation

## 2023-02-09 DIAGNOSIS — Z7982 Long term (current) use of aspirin: Secondary | ICD-10-CM | POA: Insufficient documentation

## 2023-02-09 DIAGNOSIS — Z7984 Long term (current) use of oral hypoglycemic drugs: Secondary | ICD-10-CM | POA: Insufficient documentation

## 2023-02-09 DIAGNOSIS — E1122 Type 2 diabetes mellitus with diabetic chronic kidney disease: Secondary | ICD-10-CM | POA: Insufficient documentation

## 2023-02-09 DIAGNOSIS — M25562 Pain in left knee: Secondary | ICD-10-CM | POA: Insufficient documentation

## 2023-02-09 DIAGNOSIS — Z79899 Other long term (current) drug therapy: Secondary | ICD-10-CM | POA: Diagnosis not present

## 2023-02-09 MED ORDER — DICLOFENAC SODIUM 1 % EX GEL: 2.0000 g | Freq: Four times a day (QID) | CUTANEOUS | 1 refills | Status: DC

## 2023-02-09 NOTE — ED Triage Notes (Signed)
EMS BIB patient complaining of left knee pain that started when she woke up with no injury.  No swelling falls or obvious deformity.  Reports pain with ambulating.

## 2023-02-09 NOTE — ED Provider Triage Note (Signed)
Emergency Medicine Provider Triage Evaluation Note  Robin Clayton , a 67 y.o. female  was evaluated in triage.  Pt complains of left knee pain.  Worse this morning.  Without erythema or joint swelling.  Review of Systems  Positive: As above Negative: As above  Physical Exam  BP 132/64   Pulse 63   Temp 97.6 F (36.4 C) (Oral)   Resp 16   Ht 5\' 3"  (1.6 m)   Wt 93 kg   LMP 04/30/2012   SpO2 99%   BMI 36.31 kg/m  Gen:   Awake, no distress   Resp:  Normal effort  MSK:   Moves extremities without difficulty  Other:    Medical Decision Making  Medically screening exam initiated at 9:54 AM.  Appropriate orders placed.  Robin Clayton was informed that the remainder of the evaluation will be completed by another provider, this initial triage assessment does not replace that evaluation, and the importance of remaining in the ED until their evaluation is complete.     Robin [place], PA-C 02/09/23 [phone]

## 2023-02-09 NOTE — ED Provider Notes (Signed)
[place] EMERGENCY DEPARTMENT AT [facility] Provider Note   CSN: [id number] Arrival date & time: 02/09/23  0935     History  Chief Complaint  Patient presents with   Knee Pain    Robin Clayton is a 67 y.o. female.  Patient with history of diabetes, chronic kidney disease, gout--presents for evaluation of knee pain.  Symptoms started yesterday.  No injuries.  No swelling.  She was having difficulty walking on the knee today due to pain.  She has a cane at bedside.  No fevers.  She cannot take NSAIDs due to her kidney disease.  No treatments prior to arrival.  No history of blood clot.  No calf pain or thigh pain. Patient denies risk factors for pulmonary embolism/DVT including: unilateral leg swelling, history of DVT/PE/other blood clots, use of exogenous hormones, recent immobilizations, recent surgery, recent travel (>4hr segment), malignancy, hemoptysis.          Home Medications Prior to Admission medications   Medication Sig Start Date End Date Taking? Authorizing Provider  diclofenac Sodium (VOLTAREN) 1 % GEL Apply 2 g topically 4 (four) times daily. 02/09/23  Yes Renne Crigler, PA-C  acetaminophen (TYLENOL) 500 MG tablet Take 1,000 mg by mouth every 6 (six) hours as needed for mild pain.    [provider]  albuterol (PROVENTIL) (2.5 MG/3ML) 0.083% nebulizer solution Take 2.5 mg by nebulization every 4 (four) hours as needed (shortness of breath).  05/14/14   [provider]  albuterol (VENTOLIN HFA) 108 (90 Base) MCG/ACT inhaler Inhale 2 puffs into the lungs every 6 (six) hours as needed for wheezing. Patient not taking: Reported on 01/24/2023 12/16/22   Nyoka Cowden, MD  aspirin 81 MG tablet Take 81 mg by mouth daily.    [provider]  atorvastatin (LIPITOR) 80 MG tablet TAKE 1 TABLET(80 MG) BY MOUTH DAILY 08/02/22   Tereso Newcomer T, PA-C  chlorhexidine (PERIDEX) 0.12 % solution SMARTSIG:By Mouth 09/21/21   [provider]   cloNIDine (CATAPRES) 0.1 MG tablet Take 0.1 mg by mouth daily. Also takes 0.2mg  at bedtime 09/28/19   [provider]  colchicine 0.6 MG tablet Take colchicine 1.2 mg (2 pills) when you pick up your prescription.  You may take 1 more pill (0.6 mg) 1 hour after you take your first dose.  Tomorrow, start taking 1 pill of colchicine in the morning (0.6 mg)  with food for 5 days to fully treat your gout flare. 08/29/22   Carlisle Beers, FNP  diltiazem (CARDIZEM CD) 240 MG 24 hr capsule Take 240 mg by mouth at bedtime.    [provider]  Evolocumab (REPATHA SURECLICK) 140 MG/ML SOAJ Inject 140 mg into the skin every 14 (fourteen) days. 02/25/22   Quintella Reichert, MD  ezetimibe (ZETIA) 10 MG tablet TAKE 1 TABLET(10 MG) BY MOUTH DAILY 11/09/22   Swinyer, Zachary George, NP  FEROSUL 325 (65 Fe) MG tablet TAKE 1 TABLET(325 MG) BY MOUTH EVERY OTHER DAY 09/06/22   Lynann Bologna, MD  hydrochlorothiazide (MICROZIDE) 12.5 MG capsule Take 12.5 mg by mouth daily. 03/10/18   [provider]  metFORMIN (GLUCOPHAGE) 500 MG tablet Take 500 mg by mouth See admin instructions. 500mg  in the afternoon and 500mg  at bedtime 06/16/17   [provider]  montelukast (SINGULAIR) 10 MG tablet TAKE 1 TABLET(10 MG) BY MOUTH DAILY 05/28/22   Nyoka Cowden, MD  pantoprazole (PROTONIX) 40 MG tablet Take 40 mg by mouth every morning.  12/18/20   [provider]  sertraline (ZOLOFT) 25 MG tablet Take 25 mg by mouth daily.  06/11/19   [provider]  SYMBICORT 80-4.5 MCG/ACT inhaler INHALE 2 PUFFS BY MOUTH FIRST THING IN THE MORNING THEN 2 PUFFS BY MOUTH 12 HOURS LATER 07/22/22   Parrett, Virgel Bouquet, NP  valsartan (DIOVAN) 160 MG tablet Take 160 mg by mouth at bedtime.    [provider]      Allergies    Wound dressings, Electrodes 25mm [tens therapy replace back pads], and Wound dressing adhesive    Review of Systems   Review of Systems  Physical Exam Updated Vital Signs BP  132/64   Pulse 63   Temp 97.6 F (36.4 C) (Oral)   Resp 16   Ht 5\' 3"  (1.6 m)   Wt 93 kg   LMP 04/30/2012   SpO2 99%   BMI 36.31 kg/m  Physical Exam Vitals and nursing note reviewed.  Constitutional:      Appearance: She is well-developed.  HENT:     Head: Normocephalic and atraumatic.  Eyes:     Pupils: Pupils are equal, round, and reactive to light.  Cardiovascular:     Pulses: Normal pulses. No decreased pulses.  Musculoskeletal:        General: Tenderness present.     Cervical back: Normal range of motion and neck supple.     Left upper leg: No tenderness.     Left knee: No swelling. Normal range of motion. Tenderness (posterior mostly) present. No medial joint line or lateral joint line tenderness.     Left lower leg: No tenderness.     Left ankle: No tenderness. Normal range of motion.  Skin:    General: Skin is warm and dry.  Neurological:     Mental Status: She is alert.     Sensory: No sensory deficit.     Comments: Motor, sensation, and vascular distal to the injury is fully intact.   Psychiatric:        Mood and Affect: Mood normal.     ED Results / Procedures / Treatments   Labs (all labs ordered are listed, but only abnormal results are displayed) Labs Reviewed - No data to display  EKG None  Radiology DG Knee Complete 4 Views Left  Result Date: 02/09/2023 CLINICAL DATA:  Pain EXAM: LEFT KNEE - COMPLETE 4 VIEW COMPARISON:  Two-view x-ray 09/29/2011 FINDINGS: No fracture or dislocation. Preserved joint spaces and bone mineralization. Minimal osteophytes of the mediolateral compartment. Hyperostosis of the patella. No joint effusion. Scattered vascular calcifications. IMPRESSION: No acute osseous abnormality.  Mild degenerative changes. Electronically Signed   By: Karen Kays M.D.   On: 02/09/2023 10:57    Procedures Procedures    Medications Ordered in ED Medications - No data to display  ED Course/ Medical Decision Making/ A&P    Patient  seen and examined. History obtained directly from patient. Work-up including labs, imaging, EKG ordered in triage, if performed, were reviewed.    Labs/EKG: None ordered  Imaging: X-ray of the left knee agree negative for fracture  Medications/Fluids: None ordered  Most recent vital signs reviewed and are as follows: BP 132/64   Pulse 63   Temp 97.6 F (36.4 C) (Oral)   Resp 16   Ht 5\' 3"  (1.6 m)   Wt 93 kg   LMP 04/30/2012   SpO2 99%   BMI 36.31 kg/m   Initial impression: Left knee pain, no signs of  septic arthritis  Home treatment plan: Unable to take NSAIDs.  Unwilling to trial prednisone in case that this is an early gout flare.  Recommended Tylenol and topical Voltaren as well as a knee sleeve.  Return instructions discussed with patient: New or worsening symptoms  Follow-up instructions discussed with patient: Follow-up with PCP in 1 week                                Medical Decision Making Amount and/or Complexity of Data Reviewed Radiology: ordered.   Patient with pain that is localized to the left knee.  Mostly posterior.  I do not feel any pulsatile masses behind the knee.  Distal circulation, motor, sensation is intact.  No focal calf pain or swelling or thigh pain or swelling to suggest DVT.  No other risk factors.        Final Clinical Impression(s) / ED Diagnoses Final diagnoses:  Acute pain of left knee    Rx / DC Orders ED Discharge Orders          Ordered    diclofenac Sodium (VOLTAREN) 1 % GEL  4 times daily        02/09/23 1305              Renne Crigler, PA-C 02/09/23 1311    Tegeler, Canary Brim, MD 02/09/23 1640

## 2023-02-09 NOTE — Progress Notes (Signed)
Orthopedic Tech Progress Note Patient Details:  Robin Clayton April 18, 1956 [id number]  Ortho Devices Type of Ortho Device: Knee Sleeve Ortho Device/Splint Location: Left knee Ortho Device/Splint Interventions: Ordered, Application, Adjustment   Post Interventions Patient Tolerated: Well Instructions Provided: Adjustment of device  Dayven Linsley OTR/L 02/09/2023, 1:19 PM

## 2023-02-09 NOTE — Discharge Instructions (Signed)
Please read and follow all provided instructions.  Your diagnoses today include:  1. Acute pain of left knee     Tests performed today include: An x-ray of the affected area -shows a mild arthritis, no broken bones Vital signs. See below for your results today.   Medications prescribed:  Voltaren gel  Take any prescribed medications only as directed.  Home care instructions:  Use Tylenol as directed on the packaging for pain. Follow any educational materials contained in this packet Follow R.I.C.E. Protocol: R - rest your injury  I  - use ice on injury without applying directly to skin C - compress injury with bandage or splint E - elevate the injury as much as possible  Follow-up instructions: Please follow-up with your primary care provider if you continue to have significant pain in 1 week. In this case you may have a more severe injury that requires further care.   Return instructions:  Please return if your toes or feet are numb or tingling, appear gray or blue, or you have severe pain (also elevate the leg and loosen splint or wrap if you were given one) Please return to the Emergency Department if you experience worsening symptoms.  Please return if you have any other emergent concerns.  Additional Information:  Your vital signs today were: BP 132/64   Pulse 63   Temp 97.6 F (36.4 C) (Oral)   Resp 16   Ht 5\' 3"  (1.6 m)   Wt 93 kg   LMP 04/30/2012   SpO2 99%   BMI 36.31 kg/m  If your blood pressure (BP) was elevated above 135/85 this visit, please have this repeated by your doctor within one month. --------------

## 2023-03-09 ENCOUNTER — Ambulatory Visit: Payer: Medicare Other | Admitting: Obstetrics and Gynecology

## 2023-03-09 VITALS — BP 150/69 | HR 73 | Wt 203.0 lb

## 2023-03-09 DIAGNOSIS — R9389 Abnormal findings on diagnostic imaging of other specified body structures: Secondary | ICD-10-CM

## 2023-03-09 DIAGNOSIS — N950 Postmenopausal bleeding: Secondary | ICD-10-CM | POA: Diagnosis not present

## 2023-03-09 NOTE — Progress Notes (Signed)
Pt is in the office reporting mild, bright red vaginal bleeding that started on 03/01/23 and stopped today.  Pt denies any pain associated with the bleeding.

## 2023-03-09 NOTE — Progress Notes (Signed)
  CC: postmenopausal bleeding Subjective:    Patient ID: Robin Clayton, female    DOB: April 25, 1956, 67 y.o.   MRN: [id number]  HPI 67 yo [id number], SVD x 4 , c/s x 3.  Pt recently had repeat postmenopausal bleeding 03/01/23 to today and then stopped.  Pt has had PMB before.  She has endometrial biopsy from 2023 which showed probable uterine polyp.  Pelvic ultrasound showed thickened endometrial stripe 7/24.  Pt has not had any procedure to address the bleeding at this time.   Review of Systems     Objective:   Physical Exam Vitals:   03/09/23 1408  BP: (!) 150/69  Pulse: 73   CLINICAL DATA:  Left ovarian cyst.   EXAM: TRANSABDOMINAL AND TRANSVAGINAL ULTRASOUND OF PELVIS   TECHNIQUE: Both transabdominal and transvaginal ultrasound examinations of the pelvis were performed. Transabdominal technique was performed for global imaging of the pelvis including uterus, ovaries, adnexal regions, and pelvic cul-de-sac. It was necessary to proceed with endovaginal exam following the transabdominal exam to visualize the uterus and adnexa.   COMPARISON:  Pelvic ultrasound dated 05/28/2022.   FINDINGS: Uterus   Measurements: 8.6 x 4.2 x 5.3 cm = volume: 99 mL. No fibroids or other mass visualized.   Endometrium   Thickness: 14 mm.  No focal abnormality visualized.   Right ovary   Measurements: 2.4 x 2.4 x 3.0 cm = volume: 9 mL. Normal appearance/no adnexal mass.   Left ovary   Measurements: 3.2 x 2.5 x 2.4 cm = volume: 10 mL. A left ovarian hypoechoic mass with internal blood flow measures 2.0 x 1.8 x 2.1 cm. This is similar in size to exam dated 07/13/2021.   Other findings   No abnormal free fluid.   IMPRESSION: 1. Left ovarian mass is similar in size to exam dated 07/13/2021. Recommend gynecologic consultation.   2. Thickened endometrium measuring 14 mm. In the setting of post-menopausal bleeding, endometrial sampling is indicated to exclude carcinoma. If results are  benign, sonohysterogram should be considered for focal lesion work-up. (Ref: Radiological Reasoning: Algorithmic Workup of Abnormal Vaginal Bleeding with Endovaginal Sonography and Sonohysterography. AJR 2008; 409:W11-91)          Assessment & Plan:   1. Endometrial thickening on ultrasound Since pt has had diagnostic  eval with ultrasound and biopsy, she will need hysteroscopy D and C to remove  polyp and get further endometrial sampling.  Dr. Everlene Other has been contacted for medical clearance due to OSA, kidney failure and type 2 diabetes.  - Ambulatory Referral For Surgery Scheduling  2. PMB (postmenopausal bleeding) See above - Ambulatory Referral For Surgery Scheduling  I spent 20  minutes dedicated to the care of this patient including previsit review of records, face to face time with the patient discussing current labs/scans, treatment options and post visit testing.   Warden Fillers, MD Faculty Attending, [facility]

## 2023-05-05 ENCOUNTER — Telehealth: Payer: Self-pay

## 2023-05-05 NOTE — Telephone Encounter (Signed)
 Called patient to schedule surgery w/ Dr. Zina. Patient chose to be scheduled on 06/28/23 @[facility] at 1 pm and will arrive by 11 am. Pre-op and surgery details were provided over the phone. Patient states she has access to Mychart and will be on the look out for surgery letter.

## 2023-05-06 ENCOUNTER — Telehealth (INDEPENDENT_AMBULATORY_CARE_PROVIDER_SITE_OTHER): Payer: Self-pay | Admitting: Otolaryngology

## 2023-05-06 NOTE — Telephone Encounter (Signed)
 Called patient to confirm appt, patient forgot about appt and wanted to cancel due to lack of transportation. Stated she has to give transportation services 3 day notice. Will call back to reschedule.   Date: 05/09/2023 Status: Can  Time: 11:00 AM [address] [place], [place] [address]

## 2023-05-09 ENCOUNTER — Ambulatory Visit (INDEPENDENT_AMBULATORY_CARE_PROVIDER_SITE_OTHER): Payer: Medicaid Other | Admitting: Otolaryngology

## 2023-05-18 ENCOUNTER — Ambulatory Visit (INDEPENDENT_AMBULATORY_CARE_PROVIDER_SITE_OTHER): Payer: Medicare Other | Admitting: Obstetrics and Gynecology

## 2023-05-18 ENCOUNTER — Encounter: Payer: Self-pay | Admitting: Obstetrics and Gynecology

## 2023-05-18 VITALS — BP 119/66 | HR 72 | Ht 63.0 in | Wt 205.8 lb

## 2023-05-18 DIAGNOSIS — N950 Postmenopausal bleeding: Secondary | ICD-10-CM | POA: Diagnosis not present

## 2023-05-18 DIAGNOSIS — Z01818 Encounter for other preprocedural examination: Secondary | ICD-10-CM | POA: Diagnosis not present

## 2023-05-18 NOTE — Progress Notes (Signed)
 Preop check today. Found small nodule on right lung in the middle. See [facility] date 05/16/2023 results. Care Everywhere.

## 2023-05-18 NOTE — Progress Notes (Signed)
 OB/GYN Pre-Op History and Physical  Robin Clayton is a 68 y.o. [id number] presenting for preoperative visit for hysteroscopy D and C.       Past Medical History:  Diagnosis Date   Anemia    Anxiety    Atrial tachycardia (HCC)    Bradycardia    Chronic bronchitis (HCC)    Chronic lower back pain    CKD (chronic kidney disease), stage III (HCC)    Complete heart block (HCC)    a. event monitor in 10/2017 demonstrating nonsustained atrial tachycardia up to 25 beats, sinus bradycardia to sinus tachycardia (30->130bpm), transient complete heart block with ventricular standstill for up to 4 seconds.   Constipation    COPD (chronic obstructive pulmonary disease) (HCC)    GERD (gastroesophageal reflux disease)    takes tums occ   Hyperlipidemia    Hypertension    Morbid obesity (HCC)    Obstructive sleep apnea    moderate OSA with AHI 24/hr intolerant to CPAP and could not afford oral device   Sleep apnea    Type II diabetes mellitus (HCC)     Past Surgical History:  Procedure Laterality Date   BREAST BIOPSY  12/06/2011   Procedure: BREAST BIOPSY WITH NEEDLE LOCALIZATION;  Surgeon: Enid Harry, MD;  Location: [facility] OR;  Service: General;  Laterality: Left;   CESAREAN SECTION  1970   JOINT REPLACEMENT     REVISION TOTAL HIP ARTHROPLASTY Bilateral     OB History  Gravida Para Term Preterm AB Living  7 7 7   6   SAB IAB Ectopic Multiple Live Births          # Outcome Date GA Lbr Len/2nd Weight Sex Type Anes PTL Lv  7 Term           6 Term           5 Term           4 Term           3 Term           2 Term           1 Term             Social History   Socioeconomic History   Marital status: Single    Spouse name: Not on file   Number of children: 7   Years of education: Not on file   Highest education level: Not on file  Occupational History   Occupation: Disabled  Tobacco Use   Smoking status: Former    Current packs/day: 0.00    Average packs/day: 0.5  packs/day for 38.0 years (19.0 ttl pk-yrs)    Types: Cigarettes    Start date: 07/02/1982    Quit date: 07/01/2020    Years since quitting: 2.8   Smokeless tobacco: Never  Vaping Use   Vaping status: Never Used  Substance and Sexual Activity   Alcohol use: No   Drug use: No   Sexual activity: Not Currently  Other Topics Concern   Not on file  Social History Narrative   Not on file   Social Drivers of Health   Financial Resource Strain: Low Risk  (04/13/2023)   Received from [facility]   Overall Financial Resource Strain (CARDIA)    Difficulty of Paying Living Expenses: Not hard at all  Food Insecurity: No Food Insecurity (04/13/2023)   Received from [facility]   Hunger Vital Sign    Worried About Running Out  of Food in the Last Year: Never true    Ran Out of Food in the Last Year: Never true  Transportation Needs: No Transportation Needs (04/13/2023)   Received from [facility]   PRAPARE - Transportation    Lack of Transportation (Medical): No    Lack of Transportation (Non-Medical): No  Physical Activity: Unknown (04/13/2023)   Received from [facility]   Exercise Vital Sign    Days of Exercise per Week: 0 days    Minutes of Exercise per Session: Not on file  Stress: Stress Concern Present (04/13/2023)   Received from [facility] - Occupational Stress Questionnaire    Feeling of Stress : To some extent  Social Connections: Socially Integrated (04/13/2023)   Received from [facility]   Social Network    How would you rate your social network (family, work, friends)?: Good participation with social networks    Family History  Problem Relation Age of Onset   Stomach cancer Mother 62   Hypertension Mother    Hyperlipidemia Mother    Diabetes Mother    Cancer Mother        stomach   Kidney disease Father    Hypertension Father    Diabetes Father    Heart attack Father    Kidney disease Sister    Asthma  Sister    Colon cancer Neg Hx    Colon polyps Neg Hx    Esophageal cancer Neg Hx    Rectal cancer Neg Hx     (Not in a hospital admission)   Allergies  Allergen Reactions   Wound Dressings Swelling   Electrodes 25mm [Tens Therapy Replace Back Pads] Hives and Itching    EKG electrodes causes hives, blisters and itching.   Wound Dressing Adhesive Swelling and Rash    Review of Systems: Negative except for what is mentioned in HPI.     Physical Exam: BP 119/66   Pulse 72   Ht 5\' 3"  (1.6 m)   Wt 205 lb 12.8 oz (93.4 kg)   LMP 04/30/2012   BMI 36.46 kg/m  CONSTITUTIONAL: Well-developed, well-nourished female in no acute distress.  HENT:  Normocephalic, atraumatic, External right and left ear normal. Oropharynx is clear and moist EYES: Conjunctivae and EOM are normal.   NECK: Normal range of motion, supple, no masses SKIN: Skin is warm and dry. No rash noted. Not diaphoretic. No erythema. No pallor. NEUROLGIC: Alert and oriented to person, place, and time. Normal reflexes, muscle tone coordination. No cranial nerve deficit noted. PSYCHIATRIC: Normal mood and affect. Normal behavior. Normal judgment and thought content. CARDIOVASCULAR: Normal heart rate noted, regular rhythm RESPIRATORY: Effort and breath sounds normal, no problems with respiration noted ABDOMEN: Soft, nontender, nondistended. Well-healed Pfannenstiel incision. PELVIC: external genitalia WNL, vagina normal.  Cervix soft with nonstenotic os MUSCULOSKELETAL: Normal range of motion. No edema and no tenderness. 2+ distal pulses.   Pertinent Labs/Studies:   No results found for this or any previous visit (from the past 72 hours).     Assessment and Plan :Robin Clayton is a 68 y.o. [phone] here for preoperative assessment for hysteroscopy dilation and curettage with possible polyp removal..   Plan for hysteroscopy, dilation and curettage with possible polypectomy for postmenopausal bleeding. NPO Admission labs  ordered VS Q4 Pt has received clearance from her PCP for surgery, see Dr. Daivd Dub from [facility] 12/24. Anesthesia to be aware of atrial tachycardia, OSA, COPD and type 2 DM  Avie Boeck, M.D. Attending Obstetrician & Gynecologist, [facility] for [employer], [facility]

## 2023-05-27 ENCOUNTER — Other Ambulatory Visit: Payer: Self-pay | Admitting: Physician Assistant

## 2023-06-20 ENCOUNTER — Other Ambulatory Visit: Payer: Self-pay | Admitting: Obstetrics and Gynecology

## 2023-06-20 DIAGNOSIS — N950 Postmenopausal bleeding: Secondary | ICD-10-CM

## 2023-06-20 MED ORDER — MISOPROSTOL 100 MCG PO TABS
ORAL_TABLET | ORAL | 0 refills | Status: DC
Start: 2023-06-20 — End: 2023-06-30

## 2023-06-20 NOTE — Progress Notes (Signed)
Rx sent for cytotec for hysteroscopy preop

## 2023-06-24 ENCOUNTER — Encounter (HOSPITAL_COMMUNITY): Payer: Self-pay | Admitting: Obstetrics and Gynecology

## 2023-06-24 ENCOUNTER — Other Ambulatory Visit: Payer: Self-pay

## 2023-06-24 NOTE — Progress Notes (Addendum)
PCP - Tracey Harries, MD Cardiologist - Quintella Reichert, MD   EKG - 07/06/22 Stress Test - 10/17/19 ECHO - 08/03/22  CPAP - Could not tolerate. Wears O2 (2L) at night  Fasting Blood Sugar - 100-120 Checks Blood Sugar every other day  Anesthesia review: Y  Patient verbally denies any shortness of breath, fever, cough and chest pain during phone call   -------------  SDW INSTRUCTIONS given:  Your procedure is scheduled on Tuesday, February 25th.  Report to [facility] Main Entrance "A" at 0815 A.M., and check in at the Admitting office.  Call this number if you have problems the morning of surgery:  [phone]   Remember:  Do not eat or drink after midnight the night before your surgery    Take these medicines the morning of surgery with A SIP OF WATER  allopurinol (ZYLOPRIM)  cloNIDine (CATAPRES)  misoprostol (CYTOTEC)  pantoprazole (PROTONIX)  SYMBICORT  Zoloft acetaminophen (TYLENOL)-if needed albuterol (PROVENTIL)-if needed albuterol (VENTOLIN HFA)-if needed (please bring with you on day of surgery)  DO NOT take your metformin on the day of surgery  ** PLEASE check your blood sugar the morning of your surgery when you wake up and every 2 hours until you get to the Short Stay unit.  If your blood sugar is less than 70 mg/dL, you will need to treat for low blood sugar: Do not take insulin. Treat a low blood sugar (less than 70 mg/dL) with  cup of clear juice (cranberry or apple), 4 glucose tablets, OR glucose gel. Recheck blood sugar in 15 minutes after treatment (to make sure it is greater than 70 mg/dL). If your blood sugar is not greater than 70 mg/dL on recheck, call [phone] for further instructions.    As of today, STOP taking any Aspirin (unless otherwise instructed by your surgeon) Aleve, Naproxen, Ibuprofen, Motrin, Advil, Goody's, BC's, all herbal medications, fish oil, and all vitamins.                      Do not wear jewelry, make up, or nail  polish            Do not wear lotions, powders, perfumes/colognes, or deodorant.            Do not shave 48 hours prior to surgery.  Men may shave face and neck.            Do not bring valuables to the hospital.            [facility] is not responsible for any belongings or valuables.  Do NOT Smoke (Tobacco/Vaping) 24 hours prior to your procedure If you use a CPAP at night, you may bring all equipment for your overnight stay.   Contacts, glasses, dentures or bridgework may not be worn into surgery.      For patients admitted to the hospital, discharge time will be determined by your treatment team.   Patients discharged the day of surgery will not be allowed to drive home, and someone needs to stay with them for 24 hours.    Special instructions:   - Preparing For Surgery  Before surgery, you can play an important role. Because skin is not sterile, your skin needs to be as free of germs as possible. You can reduce the number of germs on your skin by washing with CHG (chlorahexidine gluconate) Soap before surgery.  CHG is an antiseptic cleaner which kills germs and bonds with the skin  to continue killing germs even after washing.    Oral Hygiene is also important to reduce your risk of infection.  Remember - BRUSH YOUR TEETH THE MORNING OF SURGERY WITH YOUR REGULAR TOOTHPASTE  Please do not use if you have an allergy to CHG or antibacterial soaps. If your skin becomes reddened/irritated stop using the CHG.  Do not shave (including legs and underarms) for at least 48 hours prior to first CHG shower. It is OK to shave your face.  Please follow these instructions carefully.   Shower the NIGHT BEFORE SURGERY and the MORNING OF SURGERY with DIAL Soap.   Pat yourself dry with a CLEAN TOWEL.  Wear CLEAN PAJAMAS to bed the night before surgery  Place CLEAN SHEETS on your bed the night of your first shower and DO NOT SLEEP WITH PETS.   Day of Surgery: Please shower morning  of surgery  Wear Clean/Comfortable clothing the morning of surgery Do not apply any deodorants/lotions.   Remember to brush your teeth WITH YOUR REGULAR TOOTHPASTE.   Questions were answered. Patient verbalized understanding of instructions.

## 2023-06-27 ENCOUNTER — Other Ambulatory Visit: Payer: Self-pay | Admitting: *Deleted

## 2023-06-27 ENCOUNTER — Encounter (HOSPITAL_COMMUNITY): Payer: Self-pay | Admitting: Obstetrics and Gynecology

## 2023-06-27 NOTE — Progress Notes (Signed)
 Anesthesia Chart Review: Robin Clayton  Case: [id number] Date/Time: 06/28/23 1034   Procedure: DILATATION AND CURETTAGE /HYSTEROSCOPY AND POLYP RESECTION   Anesthesia type: Choice   Pre-op diagnosis:      Postmenopausal bleeding     Endometrial thickening     Polyp   Location: [facility] ROOM 09 / [facility]   Surgeons: Warden Fillers, MD       DISCUSSION: Patient is a 68 year old female scheduled for the above procedure.  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   Last visit with cardiologist Dr. Armanda Magic was on 01/06/23 for follow-up OSA, nocturnal pauses, non-obstructive CAD, HTN, non-sustained atrial tachycardia. 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 EP did not recommended PPM (saw Dr. Hillis Range in 2012-2013 and Dr. Sherryl Manges in 2020). Patient had not been able to tolerate CPAP and was not interested in [place] device or oral device. She lost some weight but then gained some back. She had been using nocturnal O2 and reported that she would need another sleep study before she could re-qualify. A split night sleep study was ordered. She denied chest pain. Known chronic DOE was stable. Last echo was in April 2024 and showed EF 60- 65%, no regional wall motion abnormalities, grade 1 diastolic dysfunction, normal RV systolic function,moderate aortic valve sclerosis with no stenosis, mean aortic valve gradient 5.2 mmHg. Coronary CTA 12/03/2021 demonstrated  CAC score 792 (98  percentile), distal LCx severe plaque (>70), distal RCA moderate plaque (50-69), LAD minimal plaque (<25), aortic atherosclerosis; FFR normal-consistent with nonobstructive coronary artery disease. Continue ASA, statin, Zetia. One year follow-up recommended. She is not currently on ASA. CV medications include Diovan, Lipitor 80 mg, chlorthalidone, clonidine, Cardizem CD 240 mg nightly, Zetia, HCTZ.    She had ENT evaluation with Dr. Jovita Kussmaul on 01/24/23 for evaluation of upper airway cough syndrome versus airway stenosis.  Transnasal fiberoptic laryngoscopy performed with the following findings: "The nasal cavity and nasopharynx did not reveal any masses or lesions, mucosa appeared to be without obvious lesions. The tongue base, pharyngeal walls, piriform sinuses, vallecula, epiglottis and postcricoid region are normal in appearance without obvious masses The visualized portion of the subglottis and proximal trachea is patent, though query is some mild (<10% stenosis? - posterior portion over cricoid does not look as crisp). Otherwise the vocal folds are mobile bilaterally, no obvious webs noted. There are no lesions on the free edge of the vocal folds nor elsewhere in the larynx worrisome for malignancy. There is some Reinke's edema and bilateral sulcus vocalis but it is not obstructive (the former)." He felt her history and exam were most consistent with mild tracheal stenosis. She preferred to avoid any procedures at that time, so he discussed observation and follow-up ~ 3-4 months for office bronchoscopy, sooner if worsening breathing. He added, "avoid intubation if possible, but should she require intubation, would recommend intubation with a 6-0 or smaller ETT - Note: I do not think this is PVFM/ILO given her history". She was not interested in evaluation for [facility] device  for OSA.    Her primary pulmonologist has been Dr. Sandrea Hughs, last office visit 12/16/22 for upper airway cough syndrome  (UACS), nocturnal hypoxemia, and Gold II COPD. He suspected she may have vocal cord dysfunction contributing to her UACS that surfaced while on ACEi. She has been on low dose ICS and GERD therapy. Consider Stiolto in the future depending on ENT evaluation. Since then she saw ENT (as above) and had a findings of RML spiculated lung lesion on 05/11/23 LCS Chest CT. She was evaluated by [facility] pulmonologist Dr. Tasia Catchings on 06/20/23 who wrote: "Plan  1. Lung nodule (Primary) 2. Centrilobular emphysema (*)  Spirometry today severe obstruction. DLCO moderate reduction.  Tumor Board referral for Bronchoscopy on 06/09/2023 Patient follows with pulmonologist, Dr. Sherene Sires for her previously diagnosed COPD and other pulmonology related issues. Imaging has been shown and discussed in detail with the patient.  RML nodule 8mm with SUV 1.5. Tumor board and patient wants to proceed with biopsy. Through shared decision making, the patient is agreeable to proceed. The office will call to schedule and provide further instruction.  Bronch with ION ordered. For July 14, 2023." Return to primary pulmonologist.   Per 04/13/23 PCP visit by Tracey Harries, MD, "She has been informed about a uterine polyp that needs to be removed. There are no contraindications for anesthesia."  She is a same day work-up. Anesthesia team to evaluate on the day of surgery.    VS: Ht 5\' 3"  (1.6 m)   Wt 93 kg   LMP 04/30/2012   BMI 36.31 kg/m   BP Readings from Last 3 Encounters:  05/18/23 119/66  03/09/23 (!) 150/69  02/09/23 132/64   Pulse Readings from Last 3 Encounters:  05/18/23 72  03/09/23 73  02/09/23 63    PROVIDERS: Tracey Harries, MD is PCP  Armanda Magic, MD is cardiologist Madlyn Frankel, MD is pulmonologist [facility]) Jovita Kussmaul, MD is ENT   LABS: For day of surgery as indicated. A1c 7.0% on 04/13/23 ([facility]). Labs as of 46/10/24 showed stable Creatinine of 1.35, AST 13, ATL 14, H/H  11.6/35.4, PLT 199.   OTHER: Spirometry 06/20/23 ([facility]): Full report is not viewing in Care Everywhere. IMPRESSION: Severe obstructive ventilatory defect.  Moderate reduction in DLCO. - Comparison 09/02/20: FVC 1.74 (70%), post 1.67 (67%). FEV1 1.06 (55%), post 1.09 (56%). DLCO unc/cor 11.74 (60%).  Walk Test 09/15/22 @ wt 203: Walked on RA x 3 lap(s) = approx 750 ft @ brisk pace, stopped due to end of study s sob with lowest 02 sats 93 %    IMAGES: PET CT Skull Base to Thighs 06/07/23 ([facility]): IMPRESSION:  1. The right middle lobe nodule is borderline in size to be evaluated by PET scan. Because it does have some activity with its small size, and the fact that it is new, it is concerning for neoplasm.  CT Chest LCS 05/11/23 ([facility]): IMPRESSION:  New spiculated 9 mm solid right middle lobe pulmonary nodule is suspicious.  RECOMMENDATIONS:  Further diagnostic work-up is recommended. This may include PET/CT and/or tissue sampling. Consider urgent pulmonology referral as appropriate.   US Pelvic 12/01/22: IMPRESSION: 1. Left ovarian mass is similar in size to exam dated 07/13/2021. Recommend gynecologic consultation. 2. Thickened endometrium measuring 14 mm. In the setting of post-menopausal bleeding, endometrial sampling is indicated to exclude carcinoma. If results are benign, sonohysterogram should be considered for focal lesion work-up. (Ref: Radiological Reasoning: Algorithmic Workup of Abnormal Vaginal Bleeding  with Endovaginal Sonography and Sonohysterography. AJR 2008; 413:K44-01)    EKG: 07/06/2022: Normal sinus rhythm Septal infarct, age undetermined   CV: Echo 08/03/2022: IMPRESSIONS   1. Left ventricular ejection fraction, by estimation, is 60 to 65%. The  left ventricle has normal function. The left ventricle has no regional  wall motion abnormalities. Left ventricular diastolic parameters are  consistent with Grade I diastolic  dysfunction (impaired  relaxation).   2. Right ventricular systolic function is normal. The right ventricular  size is normal. Tricuspid regurgitation signal is inadequate for assessing  PA pressure.   3. The mitral valve is normal in structure. No evidence of mitral valve  regurgitation. No evidence of mitral stenosis.   4. The aortic valve is tricuspid. There is moderate calcification of the  aortic valve. There is moderate thickening of the aortic valve. Aortic  valve regurgitation is not visualized. Aortic valve  sclerosis/calcification is present, without any evidence  of aortic stenosis. Aortic valve area, by VTI measures 2.01 cm. Aortic  valve mean gradient measures 5.2 mmHg. Aortic valve Vmax measures 1.57  m/s.   5. The inferior vena cava is normal in size with greater than 50%  respiratory variability, suggesting right atrial pressure of 3 mmHg.    CT Coronary with FFR 12/03/21: - Aorta: Normal size. Ascending aorta 3.0 cm. Aortic atherosclerosis. No dissection. - Aortic Valve:  Trileaflet.  No calcifications. - Coronary Arteries:  Normal coronary origin.  Right dominance.  RCA is a large dominant artery that gives rise to PDA and PLVB. There is no minimal (<25%) calcified plaque proximally and diffuse minimal soft plaque in the mid RCA. There is moderate (50-69%) calcified plaque in the distal RCA.   Left main is a large artery that gives rise to LAD and LCX arteries.   LAD is a large vessel that has diffuse calcified plaque in the proximal and mid vessel that is minimally obstructive (<25%). D1 and D2 are small vessels without plaque.   LCX is a non-dominant artery that gives rise to one large OM1 branch. There is minimal (<25%) mixed plaque proximally and mild (25-49%) mixed plaque in the mid LCX. There is severe (>70%) calcified plaque in the distal LCX. The vessel is small in this region, measuring 1.88 mm.   - Coronary Calcium Score: Left main: 0 Left anterior descending artery:  356 Left circumflex artery: 88.1 Right coronary artery: 348 Total: 792 Percentile: 98th   - Other findings: Normal pulmonary vein drainage into the left atrium. Normal let atrial appendage without a thrombus. Normal size of the pulmonary artery.   IMPRESSION: 1. Coronary calcium score of 792. This was 98th percentile for age-, race-, and sex-matched controls. 2. Normal coronary origin with right dominance. 3. There is severe (>70%) calcified plaque in the distal LCX, moderate (50-69%) plaque in the distal RCA, and minimal (<25%) plaque in the LAD. CAD-RADS 4. 4.  Aortic atherosclerosis. 5.  Will send for FFRct.   FFR: 1. Left Main: FFRct 0.99 2. LAD: FFRct 0.97 proximal, 0.93 mid, 0.87 distal 3. LCX: FFRct 0.98 proximal, 0.95 mid, 0.79 distal 4. RCA: FFRct 0.99 proximal, 0.91 mid, 0.81 distal IMPRESSION: Findings are consistent with non-obstructive coronary artery disease. Recommend aggressive risk factor management, including LDL goal <70.    Cardiac event monitor 09/07/17-10/06/17: Sinus bradycardia, normal sinus rhythm, sinus tachycardia. Heart rate ranged from 30 to 130 bpm. Transient complete heart block with ventricular standstill for up to 4 seconds. Lowest heart rate 30 bpm. This occurred  at 6:49 AM Sustained atrial tachycardia up to 25 beats. - Comparison event monitors: 08/28/15-09/26/15 SR/SB with sinus pauses 2.8-4.7 seconds; 05/13/10-06/11/10 SB with 4 second nocturnal pauses, NSVT   Past Medical History:  Diagnosis Date   Anemia    Anxiety    Atrial tachycardia (HCC)    Bradycardia    Chronic bronchitis (HCC)    Chronic lower back pain    CKD (chronic kidney disease), stage III (HCC)    Complete heart block (HCC)    a. event monitor in 10/2017 demonstrating nonsustained atrial tachycardia up to 25 beats, sinus bradycardia to sinus tachycardia (30->130bpm), transient complete heart block with ventricular standstill for up to 4 seconds.   Constipation    COPD  (chronic obstructive pulmonary disease) (HCC)    Coronary artery disease    CCTA w/ FFR 12/03/21: CAC 792 (98th percentile). 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    acetaminophen (TYLENOL) 500 MG tablet   albuterol (PROVENTIL) (2.5 MG/3ML) 0.083% nebulizer solution   albuterol (VENTOLIN HFA) 108 (90 Base) MCG/ACT inhaler   allopurinol (ZYLOPRIM) 100 MG tablet   atorvastatin (LIPITOR) 80 MG tablet   chlorthalidone (HYGROTON) 25 MG tablet   cloNIDine (CATAPRES) 0.1 MG tablet   colchicine 0.6 MG tablet   diltiazem (CARDIZEM CD) 240 MG 24 hr capsule   ezetimibe (ZETIA) 10 MG tablet   FEROSUL 325 (65 Fe) MG tablet   hydrochlorothiazide (MICROZIDE) 12.5 MG capsule   metFORMIN (GLUCOPHAGE) 500 MG tablet   montelukast (SINGULAIR) 10 MG tablet   pantoprazole (PROTONIX) 40 MG tablet   SYMBICORT 80-4.5 MCG/ACT inhaler   valsartan (DIOVAN) 160 MG tablet   aspirin 81 MG tablet   misoprostol (CYTOTEC) 100 MCG tablet     Shonna Chock, PA-C Surgical Short Stay/Anesthesiology [facility] Phone [phone] [facility] Phone [phone] 06/27/2023  12:45 PM

## 2023-06-27 NOTE — Anesthesia Preprocedure Evaluation (Signed)
 Anesthesia Evaluation  Patient identified by MRN, date of birth, ID band Patient awake    Reviewed: Allergy & Precautions, NPO status , Patient's Chart, lab work & pertinent test results  History of Anesthesia Complications Negative for: history of anesthetic complications  Airway Mallampati: II  TM Distance: >3 FB Neck ROM: Full    Dental  (+) Poor Dentition, Missing, Dental Advisory Given   Pulmonary sleep apnea (does not use CPAP) and Oxygen sleep apnea , COPD,  COPD inhaler, former smoker   breath sounds clear to auscultation       Cardiovascular hypertension, Pt. on medications (-) angina + CAD (non-obstructive)   Rhythm:Regular Rate:Normal  08/03/2022 ECHO: EF 60 to 65%. 1. The left ventricle has normal function, no regional wall motion abnormalities.  Grade I diastolic dysfunction (impaired relaxation).  2. RVF is normal. The right ventricular size is normal. Tricuspid regurgitation signal is inadequate for assessing PA pressure.  3. The MV is normal in structure. No evidence of mitral valve regurgitation. No evidence of mitral stenosis.  4. The aortic valve is tricuspid. There is moderate calcification of the aortic valve. There is moderate thickening of the aortic valve. Aortic valve regurgitation is not visualized. Aortic valve sclerosis/calcification is present, without any evidence of aortic stenosis. Aortic valve area, by VTI measures 2.01 cm. Aortic valve mean gradient measures 5.2 mmHg. Aortic valve Vmax measures 1.57 m/s.    Neuro/Psych   Anxiety     negative neurological ROS     GI/Hepatic Neg liver ROS,GERD  Medicated and Controlled,,  Endo/Other  diabetes (glu 140), Oral Hypoglycemic Agents  BMI 35.2  Renal/GU Renal InsufficiencyRenal disease     Musculoskeletal   Abdominal   Peds  Hematology Hb 11.5, plt 231k   Anesthesia Other Findings   Reproductive/Obstetrics                              Anesthesia Physical Anesthesia Plan  ASA: 3  Anesthesia Plan: General   Post-op Pain Management: Tylenol PO (pre-op)* and Minimal or no pain anticipated   Induction: Intravenous  PONV Risk Score and Plan: 3 and Ondansetron, Dexamethasone, Treatment may vary due to age or medical condition and Scopolamine patch - Pre-op  Airway Management Planned: LMA  Additional Equipment: None  Intra-op Plan:   Post-operative Plan:   Informed Consent: I have reviewed the patients History and Physical, chart, labs and discussed the procedure including the risks, benefits and alternatives for the proposed anesthesia with the patient or authorized representative who has indicated his/her understanding and acceptance.     Dental advisory given  Plan Discussed with: CRNA and Surgeon  Anesthesia Plan Comments: (See PAT note written 06/27/2023 by Shonna Chock, PA-C. She has pending bronchoscopy for RML lung nodule. Referred to ENT Dr. Allena Katz by pulmonologist Dr. Sherene Sires last year. ENT evaluation with Dr. Jovita Kussmaul on 01/24/23 for evaluation of upper airway cough syndrome versus airway stenosis.  Transnasal fiberoptic laryngoscopy performed with the following findings: "The nasal cavity and nasopharynx did not reveal any masses or lesions, mucosa appeared to be without obvious lesions. The tongue base, pharyngeal walls, piriform sinuses, vallecula, epiglottis and postcricoid region are normal in appearance without obvious masses The visualized portion of the subglottis and proximal trachea is patent, though query is some mild (<10% stenosis? - posterior portion over cricoid does not look as crisp). Otherwise the vocal folds are mobile bilaterally, no obvious webs noted. There are no  lesions on the free edge of the vocal folds nor elsewhere in the larynx worrisome for malignancy. There is some Reinke's edema and bilateral sulcus vocalis but it is not obstructive (the former)." He felt her  history and exam were most consistent with mild tracheal stenosis. She preferred to avoid any procedures at that time, so he discussed observation and follow-up ~ 3-4 months for office bronchoscopy, sooner if worsening breathing. He added, "avoid intubation if possible, but should she require intubation, would recommend intubation with a 6-0 or smaller ETT - Note: I do not think this is PVFM/ILO given her history".  )        Anesthesia Quick Evaluation

## 2023-06-27 NOTE — Progress Notes (Signed)
 Pt called to office with questions about medication use.  Attempt to call pt - LVM making her aware of instruction for vaginal use.

## 2023-06-28 ENCOUNTER — Other Ambulatory Visit: Payer: Self-pay

## 2023-06-28 ENCOUNTER — Ambulatory Visit (HOSPITAL_COMMUNITY)
Admission: RE | Admit: 2023-06-28 | Discharge: 2023-06-28 | Disposition: A | Discharge status: Home / Self Care | Payer: Medicare Other | Attending: Obstetrics and Gynecology | Admitting: Obstetrics and Gynecology

## 2023-06-28 ENCOUNTER — Encounter (HOSPITAL_COMMUNITY): Payer: Self-pay | Admitting: Obstetrics and Gynecology

## 2023-06-28 ENCOUNTER — Encounter (HOSPITAL_COMMUNITY)
Admission: RE | Disposition: A | Discharge status: Home / Self Care | Payer: Self-pay | Source: Home / Self Care | Attending: Obstetrics and Gynecology

## 2023-06-28 ENCOUNTER — Ambulatory Visit (HOSPITAL_COMMUNITY): Payer: Self-pay | Admitting: Vascular Surgery

## 2023-06-28 ENCOUNTER — Other Ambulatory Visit: Payer: Self-pay | Admitting: Obstetrics and Gynecology

## 2023-06-28 DIAGNOSIS — R9389 Abnormal findings on diagnostic imaging of other specified body structures: Secondary | ICD-10-CM

## 2023-06-28 DIAGNOSIS — I2510 Atherosclerotic heart disease of native coronary artery without angina pectoris: Secondary | ICD-10-CM

## 2023-06-28 DIAGNOSIS — G4730 Sleep apnea, unspecified: Secondary | ICD-10-CM | POA: Insufficient documentation

## 2023-06-28 DIAGNOSIS — I4719 Other supraventricular tachycardia: Secondary | ICD-10-CM | POA: Diagnosis not present

## 2023-06-28 DIAGNOSIS — N183 Chronic kidney disease, stage 3 unspecified: Secondary | ICD-10-CM | POA: Insufficient documentation

## 2023-06-28 DIAGNOSIS — N840 Polyp of corpus uteri: Secondary | ICD-10-CM | POA: Diagnosis not present

## 2023-06-28 DIAGNOSIS — I129 Hypertensive chronic kidney disease with stage 1 through stage 4 chronic kidney disease, or unspecified chronic kidney disease: Secondary | ICD-10-CM | POA: Diagnosis not present

## 2023-06-28 DIAGNOSIS — Z7984 Long term (current) use of oral hypoglycemic drugs: Secondary | ICD-10-CM | POA: Insufficient documentation

## 2023-06-28 DIAGNOSIS — E669 Obesity, unspecified: Secondary | ICD-10-CM | POA: Insufficient documentation

## 2023-06-28 DIAGNOSIS — Z6835 Body mass index (BMI) 35.0-35.9, adult: Secondary | ICD-10-CM | POA: Insufficient documentation

## 2023-06-28 DIAGNOSIS — N950 Postmenopausal bleeding: Secondary | ICD-10-CM | POA: Diagnosis present

## 2023-06-28 DIAGNOSIS — Z87891 Personal history of nicotine dependence: Secondary | ICD-10-CM | POA: Insufficient documentation

## 2023-06-28 DIAGNOSIS — Z01818 Encounter for other preprocedural examination: Secondary | ICD-10-CM

## 2023-06-28 DIAGNOSIS — R001 Bradycardia, unspecified: Secondary | ICD-10-CM | POA: Insufficient documentation

## 2023-06-28 DIAGNOSIS — K219 Gastro-esophageal reflux disease without esophagitis: Secondary | ICD-10-CM | POA: Insufficient documentation

## 2023-06-28 DIAGNOSIS — E785 Hyperlipidemia, unspecified: Secondary | ICD-10-CM | POA: Insufficient documentation

## 2023-06-28 DIAGNOSIS — J449 Chronic obstructive pulmonary disease, unspecified: Secondary | ICD-10-CM | POA: Insufficient documentation

## 2023-06-28 DIAGNOSIS — G8918 Other acute postprocedural pain: Secondary | ICD-10-CM

## 2023-06-28 DIAGNOSIS — E1122 Type 2 diabetes mellitus with diabetic chronic kidney disease: Secondary | ICD-10-CM | POA: Diagnosis not present

## 2023-06-28 DIAGNOSIS — G4733 Obstructive sleep apnea (adult) (pediatric): Secondary | ICD-10-CM | POA: Diagnosis not present

## 2023-06-28 HISTORY — DX: COVID-19: U07.1

## 2023-06-28 HISTORY — DX: Other specified cough: R05.8

## 2023-06-28 HISTORY — PX: HYSTEROSCOPY WITH D & C: SHX1775

## 2023-06-28 HISTORY — DX: Solitary pulmonary nodule: R91.1

## 2023-06-28 HISTORY — DX: Atherosclerotic heart disease of native coronary artery without angina pectoris: I25.10

## 2023-06-28 LAB — COMPREHENSIVE METABOLIC PANEL
ALT: 19 U/L (ref 0–44)
AST: 21 U/L (ref 15–41)
Albumin: 4.1 g/dL (ref 3.5–5.0)
Alkaline Phosphatase: 71 U/L (ref 38–126)
Anion gap: 14 (ref 5–15)
BUN: 28 mg/dL — ABNORMAL HIGH (ref 8–23)
CO2: 21 mmol/L — ABNORMAL LOW (ref 22–32)
Calcium: 9.8 mg/dL (ref 8.9–10.3)
Chloride: 104 mmol/L (ref 98–111)
Creatinine, Ser: 1.52 mg/dL — ABNORMAL HIGH (ref 0.44–1.00)
GFR, Estimated: 38 mL/min — ABNORMAL LOW (ref >60)
Glucose, Bld: 135 mg/dL — ABNORMAL HIGH (ref 70–99)
Potassium: 4 mmol/L (ref 3.5–5.1)
Sodium: 139 mmol/L (ref 135–145)
Total Bilirubin: 0.5 mg/dL (ref 0.0–1.2)
Total Protein: 7.9 g/dL (ref 6.5–8.1)

## 2023-06-28 LAB — CBC
HCT: 37.4 % (ref 36.0–46.0)
Hemoglobin: 11.5 g/dL — ABNORMAL LOW (ref 12.0–15.0)
MCH: 26.6 pg (ref 26.0–34.0)
MCHC: 30.7 g/dL (ref 30.0–36.0)
MCV: 86.4 fL (ref 80.0–100.0)
Platelets: 231 10*3/uL (ref 150–400)
RBC: 4.33 MIL/uL (ref 3.87–5.11)
RDW: 15.9 % — ABNORMAL HIGH (ref 11.5–15.5)
WBC: 7.2 10*3/uL (ref 4.0–10.5)
nRBC: 0 % (ref 0.0–0.2)

## 2023-06-28 LAB — GLUCOSE, CAPILLARY
Glucose-Capillary: 140 mg/dL — ABNORMAL HIGH (ref 70–99)
Glucose-Capillary: 129 mg/dL — ABNORMAL HIGH (ref 70–99)

## 2023-06-28 LAB — TYPE AND SCREEN
ABO/RH(D): O POS
Antibody Screen: NEGATIVE

## 2023-06-28 LAB — ABO/RH: ABO/RH(D): O POS

## 2023-06-28 SURGERY — DILATATION AND CURETTAGE /HYSTEROSCOPY
Anesthesia: General | Site: Uterus

## 2023-06-28 MED ORDER — FENTANYL CITRATE (PF) 250 MCG/5ML IJ SOLN
INTRAMUSCULAR | Status: AC
  Filled 2023-06-28: qty 5

## 2023-06-28 MED ORDER — OXYCODONE-ACETAMINOPHEN 2.5-325 MG PO TABS: 1.0000 | ORAL_TABLET | ORAL | 0 refills | Status: DC | PRN

## 2023-06-28 MED ORDER — POVIDONE-IODINE 10 % EX SWAB
2.0000 | Freq: Once | CUTANEOUS | Status: AC
  Administered 2023-06-28: 2 via TOPICAL

## 2023-06-28 MED ORDER — ONDANSETRON HCL 4 MG/2ML IJ SOLN
INTRAMUSCULAR | Status: AC
  Filled 2023-06-28: qty 2

## 2023-06-28 MED ORDER — EPHEDRINE SULFATE-NACL 50-0.9 MG/10ML-% IV SOSY
PREFILLED_SYRINGE | INTRAVENOUS | Status: DC | PRN
  Administered 2023-06-28 (×2): 10 mg via INTRAVENOUS

## 2023-06-28 MED ORDER — SCOPOLAMINE 1 MG/3DAYS TD PT72
1.0000 | MEDICATED_PATCH | TRANSDERMAL | Status: DC
  Administered 2023-06-28: 1.5 mg via TRANSDERMAL
  Filled 2023-06-28 (×2): qty 1

## 2023-06-28 MED ORDER — MORPHINE SULFATE (PF) 2 MG/ML IV SOLN: 2.0000 mg | INTRAVENOUS | Status: DC | PRN

## 2023-06-28 MED ORDER — SUGAMMADEX SODIUM 200 MG/2ML IV SOLN
INTRAVENOUS | Status: AC
  Filled 2023-06-28: qty 2

## 2023-06-28 MED ORDER — ORAL CARE MOUTH RINSE: 15.0000 mL | Freq: Once | OROMUCOSAL | Status: AC

## 2023-06-28 MED ORDER — LIDOCAINE 2% (20 MG/ML) 5 ML SYRINGE
INTRAMUSCULAR | Status: DC | PRN
  Administered 2023-06-28: 40 mg via INTRAVENOUS

## 2023-06-28 MED ORDER — LIDOCAINE 2% (20 MG/ML) 5 ML SYRINGE
INTRAMUSCULAR | Status: AC
  Filled 2023-06-28: qty 5

## 2023-06-28 MED ORDER — MIDAZOLAM HCL 2 MG/2ML IJ SOLN
INTRAMUSCULAR | Status: DC | PRN
  Administered 2023-06-28: 2 mg via INTRAVENOUS

## 2023-06-28 MED ORDER — LACTATED RINGERS IV SOLN: INTRAVENOUS | Status: DC

## 2023-06-28 MED ORDER — OXYCODONE HCL 5 MG/5ML PO SOLN: 5.0000 mg | Freq: Once | ORAL | Status: DC | PRN

## 2023-06-28 MED ORDER — MIDAZOLAM HCL 2 MG/2ML IJ SOLN
INTRAMUSCULAR | Status: AC
  Filled 2023-06-28: qty 2

## 2023-06-28 MED ORDER — INSULIN ASPART 100 UNIT/ML IJ SOLN: 0.0000 [IU] | INTRAMUSCULAR | Status: DC | PRN

## 2023-06-28 MED ORDER — EPHEDRINE 5 MG/ML INJ
INTRAVENOUS | Status: AC
  Filled 2023-06-28: qty 5

## 2023-06-28 MED ORDER — CHLORHEXIDINE GLUCONATE 0.12 % MT SOLN
15.0000 mL | Freq: Once | OROMUCOSAL | Status: AC
  Administered 2023-06-28: 15 mL via OROMUCOSAL
  Filled 2023-06-28: qty 15

## 2023-06-28 MED ORDER — FENTANYL CITRATE (PF) 250 MCG/5ML IJ SOLN
INTRAMUSCULAR | Status: DC | PRN
  Administered 2023-06-28: 100 ug via INTRAVENOUS

## 2023-06-28 MED ORDER — MEPERIDINE HCL 25 MG/ML IJ SOLN: 6.2500 mg | INTRAMUSCULAR | Status: DC | PRN

## 2023-06-28 MED ORDER — SODIUM CHLORIDE 0.9 % IV SOLN: INTRAVENOUS | Status: DC

## 2023-06-28 MED ORDER — PROPOFOL 10 MG/ML IV BOLUS
INTRAVENOUS | Status: DC | PRN
  Administered 2023-06-28: 200 mg via INTRAVENOUS

## 2023-06-28 MED ORDER — PROPOFOL 10 MG/ML IV BOLUS
INTRAVENOUS | Status: AC
  Filled 2023-06-28: qty 20

## 2023-06-28 MED ORDER — HYDROMORPHONE HCL 1 MG/ML IJ SOLN: 0.2500 mg | INTRAMUSCULAR | Status: DC | PRN

## 2023-06-28 MED ORDER — ACETAMINOPHEN 500 MG PO TABS
1000.0000 mg | ORAL_TABLET | ORAL | Status: AC
  Administered 2023-06-28: 1000 mg via ORAL
  Filled 2023-06-28: qty 2

## 2023-06-28 MED ORDER — ONDANSETRON HCL 4 MG/2ML IJ SOLN: 4.0000 mg | Freq: Once | INTRAMUSCULAR | Status: DC | PRN

## 2023-06-28 MED ORDER — OXYCODONE HCL 5 MG PO TABS: 5.0000 mg | ORAL_TABLET | Freq: Once | ORAL | Status: DC | PRN

## 2023-06-28 MED ORDER — SOD CITRATE-CITRIC ACID 500-334 MG/5ML PO SOLN
30.0000 mL | ORAL | Status: AC
  Administered 2023-06-28: 30 mL via ORAL
  Filled 2023-06-28: qty 30

## 2023-06-28 MED ORDER — ACETAMINOPHEN 500 MG PO TABS: 1000.0000 mg | ORAL_TABLET | Freq: Once | ORAL | Status: AC

## 2023-06-28 MED ORDER — ONDANSETRON HCL 4 MG/2ML IJ SOLN
INTRAMUSCULAR | Status: DC | PRN
  Administered 2023-06-28: 4 mg via INTRAVENOUS

## 2023-06-28 MED ORDER — MIDAZOLAM HCL 2 MG/2ML IJ SOLN: 0.5000 mg | Freq: Once | INTRAMUSCULAR | Status: DC | PRN

## 2023-06-28 SURGICAL SUPPLY — 12 items
CATH ROBINSON RED A/P 16FR (CATHETERS) ×2 IMPLANT
DEVICE MYOSURE LITE (MISCELLANEOUS) IMPLANT
ELECT REM PT RETURN 9FT ADLT (ELECTROSURGICAL) IMPLANT
ELECTRODE REM PT RTRN 9FT ADLT (ELECTROSURGICAL) ×2 IMPLANT
GLOVE SURG ORTHO 8.0 STRL STRW (GLOVE) ×2 IMPLANT
GLOVE SURG UNDER POLY LF SZ7 (GLOVE) ×4 IMPLANT
GOWN STRL REUS W/ TWL LRG LVL3 (GOWN DISPOSABLE) ×4 IMPLANT
KIT PROCEDURE FLUENT (KITS) ×2 IMPLANT
LOOP CUTTING BIPOLAR 21FR (ELECTRODE) IMPLANT
PACK VAGINAL MINOR WOMEN LF (CUSTOM PROCEDURE TRAY) ×2 IMPLANT
PAD OB MATERNITY 11 LF (PERSONAL CARE ITEMS) ×2 IMPLANT
TOWEL GREEN STERILE FF (TOWEL DISPOSABLE) ×4 IMPLANT

## 2023-06-28 NOTE — Anesthesia Postprocedure Evaluation (Signed)
 Anesthesia Post Note  Patient: Robin Clayton  Procedure(s) Performed: DILATATION AND CURETTAGE /HYSTEROSCOPY AND POLYP RESECTION (Uterus)     Patient location during evaluation: PACU Anesthesia Type: General Level of consciousness: awake and alert, patient cooperative and oriented Pain management: pain level controlled Vital Signs Assessment: post-procedure vital signs reviewed and stable Respiratory status: spontaneous breathing, nonlabored ventilation and respiratory function stable Cardiovascular status: blood pressure returned to baseline and stable Postop Assessment: no apparent nausea or vomiting and able to ambulate Anesthetic complications: no   No notable events documented.  Last Vitals:  Vitals:   06/28/23 1145 06/28/23 1200  BP: (!) 152/67 (!) 154/67  Pulse: 68 69  Resp: 18 15  Temp:  36.5 C  SpO2: 91% 93%    Last Pain:  Vitals:   06/28/23 1145  TempSrc:   PainSc: 0-No pain                 Quetzal Meany,E. Daenerys Buttram

## 2023-06-28 NOTE — Progress Notes (Signed)
 Dr. Donavan Foil made aware that the patient took Aspirin 81 mg this morning and that the patient states that she inserted 1/2 tab Misoprostol (Cytotec) in the vagina the night before surgery and the morning of surgery but found 1/2 tab on the floor this morning. Ok per Dr. Donavan Foil. No new orders received.

## 2023-06-28 NOTE — Op Note (Signed)
 PREOPERATIVE DIAGNOSIS:  Postmenopausal uterine bleeding POSTOPERATIVE DIAGNOSIS: The same with endometrial polyp PROCEDURE: Hysteroscopy, Dilation and Curettage.  Myosure polypectomy SURGEON:  Dr. Mariel Aloe   INDICATIONS: 68 y.o. [id number]  here for scheduled surgery for the aforementioned diagnoses.   Risks of surgery were discussed with the patient including but not limited to: bleeding which may require transfusion; infection which may require antibiotics; injury to uterus or surrounding organs; intrauterine scarring which may impair future fertility; need for additional procedures including laparotomy or laparoscopy; and other postoperative/anesthesia complications. Written informed consent was obtained.    FINDINGS:  A 8 week size uterus.  Normal postmenopausal endometrium, large endometrial polyp extending from the fundus .  Normal ostia bilaterally.  ANESTHESIA:   General INTRAVENOUS FLUIDS:  400 ml of LR FLUID DEFICITS:  60ml of NS ESTIMATED BLOOD LOSS:  Less than 10 ml SPECIMENS: Endometrial curettings sent to pathology, polyp fragments COMPLICATIONS:  None immediate.  PROCEDURE DETAILS:  The patient was then taken to the operating room where general anesthesia was administered and was found to be adequate.  After an adequate timeout was performed, she was placed in the dorsal lithotomy position and examined; then prepped and draped in the sterile manner.   Her bladder was catheterized for of clear, yellow urine. A speculum was then placed in the patient's vagina and a single tooth tenaculum was applied to the anterior lip of the cervix.    The uterus was sounded to 8 cm and the cervix was dilated manually with metal dilators to accommodate the 5 mm diagnostic hysteroscope.  The hysteroscope was then inserted under direct visualization using NS as a suspension medium.  The uterine cavity was carefully examined with the findings as noted above.  After the uterine polyp was  identified, the myosure resection apparatus was introduced,  the majority of the polyp was removed with the [facility] device.   After further careful visualization of the uterine cavity, the hysteroscope was removed under direct visualization.  A sharp curettage was then performed to obtain a moderate amount of endometrial curettings.  The tenaculum was removed from the anterior lip of the cervix and the vaginal speculum was removed after noting good hemostasis.  The patient tolerated the procedure well and was taken to the recovery area awake, extubated and in stable condition.   The patient will be discharged to home as per PACU criteria.  Routine postoperative instructions given.  She was prescribed Percocet.  She will follow up in the clinic in 2-3 weeks  for postoperative evaluation.   Mariel Aloe, MD, FACOG Obstetrician & Gynecologist, [facility] for [facility], [facility]

## 2023-06-28 NOTE — Transfer of Care (Signed)
 Immediate Anesthesia Transfer of Care Note  Patient: Robin Clayton  Procedure(s) Performed: DILATATION AND CURETTAGE /HYSTEROSCOPY AND POLYP RESECTION (Uterus)  Patient Location: PACU  Anesthesia Type:General  Level of Consciousness: awake and alert   Airway & Oxygen Therapy: Patient Spontanous Breathing and Patient connected to nasal cannula oxygen  Post-op Assessment: Report given to RN and Post -op Vital signs reviewed and stable  Post vital signs: Reviewed and stable  Last Vitals:  Vitals Value Taken Time  BP 143/60 06/28/23 1130  Temp    Pulse 70 06/28/23 1132  Resp 16 06/28/23 1132  SpO2 97 % 06/28/23 1132  Vitals shown include unfiled device data.  Last Pain:  Vitals:   06/28/23 0848  TempSrc:   PainSc: 0-No pain      Patients Stated Pain Goal: 0 (06/28/23 0848)  Complications: No notable events documented.

## 2023-06-28 NOTE — H&P (Signed)
 OB/GYN Pre-Op History and Physical  Robin Clayton is a 68 y.o. [id number] presenting for hysteroscopy D and C with possible polypectomy. Risks and benefits of the procedure given including bleeding, infection, involvement of other organs as well as uterine perforation.      Past Medical History:  Diagnosis Date   Anemia    Anxiety    Atrial tachycardia (HCC)    Bradycardia    Chronic bronchitis (HCC)    Chronic lower back pain    CKD (chronic kidney disease), stage III (HCC)    Complete heart block (HCC)    a. event monitor in 10/2017 demonstrating nonsustained atrial tachycardia up to 25 beats, sinus bradycardia to sinus tachycardia (30->130bpm), transient complete heart block with ventricular standstill for up to 4 seconds.   Constipation    COPD (chronic obstructive pulmonary disease) (HCC)    Coronary artery disease    CCTA w/ FFR 12/03/21: CAC 792 (98th percentile). >70% dLCx, 50-69% dRCA, <25% LAD, non-obstructive CAD by FFR   COVID    2020   GERD (gastroesophageal reflux disease)    takes tums occ   Hyperlipidemia    Hypertension    Lung nodule    RML 05/11/23 CT   Morbid obesity (HCC)    Obstructive sleep apnea    moderate OSA with AHI 24/hr intolerant to CPAP and could not afford oral device   Sleep apnea    Type II diabetes mellitus (HCC)    Upper airway cough syndrome    query of mild < 10% tracheal stenosis 01/24/23 transansal fiberoptic laryngoscopy    Past Surgical History:  Procedure Laterality Date   BREAST BIOPSY  12/06/2011   Procedure: BREAST BIOPSY WITH NEEDLE LOCALIZATION;  Surgeon: Emelia Loron, MD;  Location: [facility] OR;  Service: General;  Laterality: Left;   CESAREAN SECTION  1970   JOINT REPLACEMENT     REVISION TOTAL HIP ARTHROPLASTY Bilateral     OB History  Gravida Para Term Preterm AB Living  7 7 7   6   SAB IAB Ectopic Multiple Live Births          # Outcome Date GA Lbr Len/2nd Weight Sex Type Anes PTL Lv  7 Term           6 Term            5 Term           4 Term           3 Term           2 Term           1 Term             Social History   Socioeconomic History   Marital status: Single    Spouse name: Not on file   Number of children: 7   Years of education: Not on file   Highest education level: Not on file  Occupational History   Occupation: Disabled  Tobacco Use   Smoking status: Former    Current packs/day: 0.00    Average packs/day: 0.5 packs/day for 38.0 years (19.0 ttl pk-yrs)    Types: Cigarettes    Start date: 07/02/1982    Quit date: 07/01/2020    Years since quitting: 2.9   Smokeless tobacco: Never  Vaping Use   Vaping status: Never Used  Substance and Sexual Activity   Alcohol use: No   Drug use: No   Sexual activity: Not Currently  Other  Topics Concern   Not on file  Social History Narrative   Not on file   Social Drivers of Health   Financial Resource Strain: Low Risk  (04/13/2023)   Received from [facility]   Overall Financial Resource Strain (CARDIA)    Difficulty of Paying Living Expenses: Not hard at all  Food Insecurity: No Food Insecurity (04/13/2023)   Received from [facility]   Hunger Vital Sign    Worried About Running Out of Food in the Last Year: Never true    Ran Out of Food in the Last Year: Never true  Transportation Needs: No Transportation Needs (04/13/2023)   [facility] from [facility] - Transportation    Lack of Transportation (Medical): No    Lack of Transportation (Non-Medical): No  Physical Activity: Unknown (04/13/2023)   Received from [facility]   Exercise Vital Sign    Days of Exercise per Week: 0 days    Minutes of Exercise per Session: Not on file  Stress: Stress Concern Present (04/13/2023)   Received from [facility] - Occupational Stress Questionnaire    Feeling of Stress : To some extent  Social Connections: Socially Integrated (04/13/2023)   Received from [facility]   Social  Network    How would you rate your social network (family, work, friends)?: Good participation with social networks    Family History  Problem Relation Age of Onset   Stomach cancer Mother 55   Hypertension Mother    Hyperlipidemia Mother    Diabetes Mother    Cancer Mother        stomach   Kidney disease Father    Hypertension Father    Diabetes Father    Heart attack Father    Kidney disease Sister    Asthma Sister    Colon cancer Neg Hx    Colon polyps Neg Hx    Esophageal cancer Neg Hx    Rectal cancer Neg Hx     Medications Prior to Admission  Medication Sig Dispense Refill Last Dose/Taking   albuterol (VENTOLIN HFA) 108 (90 Base) MCG/ACT inhaler Inhale 1-2 puffs into the lungs every 6 (six) hours as needed for wheezing or shortness of breath.   06/28/2023 at  7:00 AM   allopurinol (ZYLOPRIM) 100 MG tablet Take 100 mg by mouth daily.   06/28/2023 at  6:00 AM   aspirin 81 MG tablet Take 81 mg by mouth daily.   06/28/2023 at  6:00 AM   atorvastatin (LIPITOR) 80 MG tablet TAKE 1 TABLET BY MOUTH EVERY DAY 90 tablet 1 06/27/2023   chlorthalidone (HYGROTON) 25 MG tablet Take 25 mg by mouth daily.   06/27/2023   cloNIDine (CATAPRES) 0.1 MG tablet Take 0.1-0.2 mg by mouth See admin instructions. 0.1 mg in the morning, and 0.2mg  at bedtime   06/28/2023 at  6:00 AM   diltiazem (CARDIZEM CD) 240 MG 24 hr capsule Take 240 mg by mouth at bedtime.   06/27/2023 Bedtime   ezetimibe (ZETIA) 10 MG tablet TAKE 1 TABLET(10 MG) BY MOUTH DAILY 90 tablet 3 06/27/2023 Bedtime   FEROSUL 325 (65 Fe) MG tablet TAKE 1 TABLET(325 MG) BY MOUTH EVERY OTHER DAY 30 tablet 11 06/27/2023   hydrochlorothiazide (MICROZIDE) 12.5 MG capsule Take 12.5 mg by mouth daily.   06/27/2023   metFORMIN (GLUCOPHAGE) 500 MG tablet Take 500 mg by mouth See admin instructions. 500mg  in the afternoon and 500mg  at bedtime  06/27/2023 Bedtime   misoprostol (CYTOTEC) 100 MCG tablet 1/2 tab per vagina the night before and the morning of the  procedure. 1 tablet 0 06/28/2023 Morning   montelukast (SINGULAIR) 10 MG tablet TAKE 1 TABLET(10 MG) BY MOUTH DAILY 30 tablet 11 06/28/2023 at  6:00 AM   pantoprazole (PROTONIX) 40 MG tablet Take 40 mg by mouth every morning.   06/28/2023 at  6:00 AM   SYMBICORT 80-4.5 MCG/ACT inhaler INHALE 2 PUFFS BY MOUTH FIRST THING IN THE MORNING THEN 2 PUFFS BY MOUTH 12 HOURS LATER 10.2 g 11 06/28/2023 at  6:00 AM   valsartan (DIOVAN) 160 MG tablet Take 160 mg by mouth at bedtime.   06/27/2023 Bedtime   acetaminophen (TYLENOL) 500 MG tablet Take 1,000 mg by mouth every 6 (six) hours as needed for mild pain.   More than a month   albuterol (PROVENTIL) (2.5 MG/3ML) 0.083% nebulizer solution Take 2.5 mg by nebulization every 4 (four) hours as needed (shortness of breath).   3 More than a month   colchicine 0.6 MG tablet Take colchicine 1.2 mg (2 pills) when you pick up your prescription.  You may take 1 more pill (0.6 mg) 1 hour after you take your first dose.  Tomorrow, start taking 1 pill of colchicine in the morning (0.6 mg)  with food for 5 days to fully treat your gout flare. 8 tablet 0 More than a month    Allergies  Allergen Reactions   Wound Dressings Swelling   Electrodes 25mm [Tens Therapy Replace Back Pads] Hives and Itching    EKG electrodes causes hives, blisters and itching.   Wound Dressing Adhesive Swelling and Rash    Review of Systems: Negative except for what is mentioned in HPI.     Physical Exam: BP (!) 144/69 (BP Location: Right Arm)   Pulse 62   Temp 97.8 F (36.6 C) (Oral)   Resp 18   Ht 5\' 4"  (1.626 m)   Wt 93 kg   LMP 04/30/2012   SpO2 100%   BMI 35.19 kg/m  CONSTITUTIONAL: Well-developed, well-nourished female in no acute distress.  HENT:  Normocephalic, atraumatic, External right and left ear normal. Oropharynx is clear and moist EYES: Conjunctivae and EOM are normal. NECK: Normal range of motion, supple, no masses SKIN: Skin is warm and dry. No rash noted. Not  diaphoretic. No erythema. No pallor. NEUROLGIC: Alert and oriented to person, place, and time. Normal reflexes, muscle tone coordination. No cranial nerve deficit noted. PSYCHIATRIC: Normal mood and affect. Normal behavior. Normal judgment and thought content. CARDIOVASCULAR: Normal heart rate noted, regular rhythm RESPIRATORY: Effort and breath sounds normal, no problems with respiration noted ABDOMEN: Soft, nontender, nondistended,Well-healed Pfannenstiel incision. PELVIC: Deferred MUSCULOSKELETAL: Normal range of motion. No edema and no tenderness. 2+ distal pulses.   Pertinent Labs/Studies:   Results for orders placed or performed during the hospital encounter of 06/28/23 (from the past 72 hours)  Type and screen     Status: None   Collection Time: 06/28/23  8:40 AM  Result Value Ref Range   ABO/RH(D) O POS    Antibody Screen NEG    Sample Expiration      07/01/2023,2359 Performed at [facility], [address] [place], [place] [address]   ABO/Rh     Status: None   Collection Time: 06/28/23  8:45 AM  Result Value Ref Range   ABO/RH(D)      O POS Performed at [facility], [address] [place], [place] [address]   Glucose, capillary     Status: Abnormal   Collection Time: 06/28/23  8:47 AM  Result Value Ref Range   Glucose-Capillary 140 (H) 70 - 99 mg/dL    Comment: Glucose reference range applies only to samples taken after fasting for at least 8 hours.   Comment 1 Notify RN    Comment 2 Document in Chart   CBC     Status: Abnormal   Collection Time: 06/28/23  8:59 AM  Result Value Ref Range   WBC 7.2 4.0 - 10.5 K/uL   RBC 4.33 3.87 - 5.11 MIL/uL   Hemoglobin 11.5 (L) 12.0 - 15.0 g/dL   HCT 60.4 54.0 - 98.1 %   MCV 86.4 80.0 - 100.0 fL   MCH 26.6 26.0 - 34.0 pg   MCHC 30.7 30.0 - 36.0 g/dL   RDW 19.1 (H) 47.8 - 29.5 %   Platelets 231 150 - 400 K/uL   nRBC 0.0 0.0 - 0.2 %    Comment: Performed at [facility], [address], [place],  [place] [address]  Comprehensive metabolic panel     Status: Abnormal   Collection Time: 06/28/23  8:59 AM  Result Value Ref Range   Sodium 139 135 - 145 mmol/L   Potassium 4.0 3.5 - 5.1 mmol/L   Chloride 104 98 - 111 mmol/L   CO2 21 (L) 22 - 32 mmol/L   Glucose, Bld 135 (H) 70 - 99 mg/dL    Comment: Glucose reference range applies only to samples taken after fasting for at least 8 hours.   BUN 28 (H) 8 - 23 mg/dL   Creatinine, Ser 8.65 (H) 0.44 - 1.00 mg/dL   Calcium 9.8 8.9 - 78.4 mg/dL   Total Protein 7.9 6.5 - 8.1 g/dL   Albumin 4.1 3.5 - 5.0 g/dL   AST 21 15 - 41 U/L   ALT 19 0 - 44 U/L   Alkaline Phosphatase 71 38 - 126 U/L   Total Bilirubin 0.5 0.0 - 1.2 mg/dL   GFR, Estimated 38 (L) >60 mL/min    Comment: (NOTE) Calculated using the CKD-EPI Creatinine Equation (2021)    Anion gap 14 5 - 15    Comment: Performed at [facility], [address] [place], [place] [address]  CLINICAL DATA:  Left ovarian cyst.   EXAM: TRANSABDOMINAL AND TRANSVAGINAL ULTRASOUND OF PELVIS   TECHNIQUE: Both transabdominal and transvaginal ultrasound examinations of the pelvis were performed. Transabdominal technique was performed for global imaging of the pelvis including uterus, ovaries, adnexal regions, and pelvic cul-de-sac. It was necessary to proceed with endovaginal exam following the transabdominal exam to visualize the uterus and adnexa.   COMPARISON:  Pelvic ultrasound dated 05/28/2022.   FINDINGS: Uterus   Measurements: 8.6 x 4.2 x 5.3 cm = volume: 99 mL. No fibroids or other mass visualized.   Endometrium   Thickness: 14 mm.  No focal abnormality visualized.   Right ovary   Measurements: 2.4 x 2.4 x 3.0 cm = volume: 9 mL. Normal appearance/no adnexal mass.   Left ovary   Measurements: 3.2 x 2.5 x 2.4 cm = volume: 10 mL. A left ovarian hypoechoic mass with internal blood flow measures 2.0 x 1.8 x 2.1 cm. This is similar in size to exam dated 07/13/2021.   Other  findings   No abnormal free fluid.   IMPRESSION: 1. Left ovarian mass is similar in size to exam dated  07/13/2021. Recommend gynecologic consultation.   2. Thickened endometrium measuring 14 mm. In the setting of post-menopausal bleeding, endometrial sampling is indicated to exclude carcinoma. If results are benign, sonohysterogram should be considered for focal lesion work-up. (Ref: Radiological Reasoning: Algorithmic Workup of Abnormal Vaginal Bleeding with Endovaginal Sonography and Sonohysterography. AJR 2008; 914:N82-95)       Assessment and Plan :Robin Clayton is a 68 y.o. [id number] here for hysteroscopy D and C.   Plan for hysteroscopy, dilation and curettage with possible polypectomy NPO Admission labs ordered VS Q4 Pt has previously received clearance from her PCP, Dr. Everlene Other. Anesthesia to e aware of atrial tachycardia, OSA, COPD and type 2 DM.   Mariel Aloe, M.D. Attending Obstetrician & Gynecologist, [facility] for [employer], [facility]

## 2023-06-29 ENCOUNTER — Encounter (HOSPITAL_COMMUNITY): Payer: Self-pay | Admitting: Obstetrics and Gynecology

## 2023-06-29 ENCOUNTER — Encounter: Payer: Self-pay | Admitting: Obstetrics and Gynecology

## 2023-06-29 LAB — SURGICAL PATHOLOGY

## 2023-07-11 ENCOUNTER — Ambulatory Visit (HOSPITAL_BASED_OUTPATIENT_CLINIC_OR_DEPARTMENT_OTHER): Payer: Medicare Other | Attending: Cardiology | Admitting: Cardiology

## 2023-07-11 VITALS — Ht 64.0 in | Wt 205.0 lb

## 2023-07-11 DIAGNOSIS — G4733 Obstructive sleep apnea (adult) (pediatric): Secondary | ICD-10-CM | POA: Diagnosis present

## 2023-07-11 DIAGNOSIS — G4736 Sleep related hypoventilation in conditions classified elsewhere: Secondary | ICD-10-CM | POA: Diagnosis not present

## 2023-07-12 NOTE — Procedures (Signed)
   [facility] [address] [place], [place] [address] Tel: [phone]   Fax: [phone] Polysomnography Interpretation  Patient Name:  Robin Clayton, Robin Clayton Date:  2023-07-11 Referring Physician:  Armanda Magic, MD  Indications for Polysomnography The patient is a 68 year-old Female who is 5\' 4"  and weighs 451.9 lbs. Her BMI equals 78.1.  A full night polysomnogram was performed to evaluate for Obstructive Sleep Apnea.  Medication  Valsartan  Metformin  Ezetimibe  Diltiazem  Clonidine   Polysomnogram Data A full night polysomnogram recorded the standard physiologic parameters including EEG, EOG, EMG, EKG, nasal and oral airflow.  Respiratory parameters of chest and abdominal movements were recorded with Respiratory Inductance Plethysmography belts.  Oxygen saturation was recorded by pulse oximetry.   Sleep Architecture The total recording time of the polysomnogram was 379.8 minutes.  The total sleep time was 258.5 minutes.  The patient spent 5.2% of total sleep time in Stage N1, 76.6% in Stage N2, 0.0% in Stages N3, and 18.2% in REM.  Sleep latency was 21.8 minutes.  REM latency was 87.5 minutes.  Sleep Efficiency was 68.1%.  Wake after Sleep Onset time was 99.0 minutes.  Respiratory Events The polysomnogram revealed a presence of 0 obstructive, 0 central, and 0 mixed apneas resulting in an Apnea index of 0 events per hour.  There were 56 hypopneas (>=3% desaturation and/or arousal) resulting in an Apnea\Hypopnea Index (AHI >=3% desaturation and/or arousal) of 13.0 events per hour.  There were 33 hypopneas (>=4% desaturation) resulting in an Apnea\Hypopnea Index (AHI >=4% desaturation) of 7.7 events per hour.  There were 1 Respiratory Effort Related Arousals resulting in a RERA index of 0.2 events per hour. The Respiratory Disturbance Index is 13.2 events per hour.  The snore index was 597.7 events per hour.  Mean oxygen saturation was 92.0%.  The  lowest oxygen saturation during sleep was 81.0%.  Time spent <=88% oxygen saturation was 12.7 minutes (3.4%).  Limb Activity There were 0 total limb movements recorded, of this total, 0 were classified as PLMs.  PLM index was 0 per hour and PLM associated with Arousals index was 0 per hour.  Cardiac Summary The average pulse rate was 61.1 bpm.  The minimum pulse rate was 35.0 bpm while the maximum pulse rate was 87.0 bpm.  Cardiac rhythm was normal.  Diagnosis:  Mild Obstructive Sleep Apnea overall with AHI 7.7/hr but Severe during REM sleep with REM AHI 34.5/hr. Nocturnal Hypoxemia  Recommendations: Recommend in lab CPAP titration for sleep disordered breathing Consider oral device fitted by [facility] if unable to tolerate CPAP. Consider referral to ENT to rule out surgical causes of obstructive airway disease. The patient should be counseled in good sleep hygiene and avoid sleeping supine. The patient should be counseled to avoid driving when sleepy.    This study was personally reviewed and electronically signed by: Armanda Magic, MD Accredited Board Certified in Sleep Medicine Date/Time: 07/12/2023 9:47PM

## 2023-07-18 ENCOUNTER — Telehealth: Payer: Self-pay

## 2023-07-18 DIAGNOSIS — E7800 Pure hypercholesterolemia, unspecified: Secondary | ICD-10-CM

## 2023-07-18 DIAGNOSIS — J449 Chronic obstructive pulmonary disease, unspecified: Secondary | ICD-10-CM

## 2023-07-18 DIAGNOSIS — I2510 Atherosclerotic heart disease of native coronary artery without angina pectoris: Secondary | ICD-10-CM

## 2023-07-18 DIAGNOSIS — I350 Nonrheumatic aortic (valve) stenosis: Secondary | ICD-10-CM

## 2023-07-18 DIAGNOSIS — I359 Nonrheumatic aortic valve disorder, unspecified: Secondary | ICD-10-CM

## 2023-07-18 DIAGNOSIS — I4719 Other supraventricular tachycardia: Secondary | ICD-10-CM

## 2023-07-18 DIAGNOSIS — G4733 Obstructive sleep apnea (adult) (pediatric): Secondary | ICD-10-CM

## 2023-07-18 DIAGNOSIS — I10 Essential (primary) hypertension: Secondary | ICD-10-CM

## 2023-07-18 DIAGNOSIS — R0609 Other forms of dyspnea: Secondary | ICD-10-CM

## 2023-07-18 NOTE — Telephone Encounter (Signed)
 Notified patient of sleep study results and recommendations. All questions were answered and patient verbalized understanding. CPAP Titration ordered today.

## 2023-07-18 NOTE — Telephone Encounter (Signed)
-----   Message from Armanda Magic sent at 07/12/2023  9:49 PM EDT ----- Please let patient know that they have sleep apnea.  Recommend therapeutic CPAP titration for treatment of patient's sleep disordered breathing.

## 2023-07-26 ENCOUNTER — Ambulatory Visit: Payer: Medicare Other | Admitting: Obstetrics and Gynecology

## 2023-07-26 VITALS — BP 135/56 | HR 69 | Ht 63.5 in | Wt 205.2 lb

## 2023-07-26 DIAGNOSIS — Z4889 Encounter for other specified surgical aftercare: Secondary | ICD-10-CM

## 2023-07-26 NOTE — Progress Notes (Signed)
    Subjective:    Robin Clayton is a 68 y.o. female who presents to the clinic status post hysteroscopy, polypectomy, D and C  on 06/28/23. The patient is not having any pain.  Eating a regular diet without difficulty. Bowel movements are normal. No other significant postoperative concerns.  The following portions of the patient's history were reviewed and updated as appropriate: allergies, current medications, past family history, past medical history, past social history, past surgical history, and problem list..  Last pap smear was normal on 07/08/21.  Review of Systems Pertinent items are noted in HPI.   Objective:   BP (!) 135/56   Pulse 69   Ht 5' 3.5" (1.613 m)   Wt 205 lb 3.2 oz (93.1 kg)   LMP 04/30/2012   BMI 35.78 kg/m  Constitutional:  Well-developed, well-nourished female in no acute distress.   Skin: Skin is warm and dry, no rash noted, not diaphoretic,no erythema, no pallor.  Cardiovascular: Normal heart rate noted  Respiratory: Effort and breath sounds normal, no problems with respiration noted  Abdomen: Soft, bowel sounds active, non-tender, no abnormal masses  Incision: N/a  Pelvic:   Deferred   Surgical pathology () Benign endometrium and uterine polyp Assessment:   Doing well postoperatively.  Operative findings again reviewed. Pathology report discussed.   Plan:   1. Continue any current medications. 2. Wound care discussed. 3. Activity restrictions: none 4. Anticipated return to work: not applicable. 5. Follow up as needed 6.  Routine preventative health maintenance measures emphasized. Please refer to After Visit Summary for other counseling recommendations.    Mariel Aloe, MD, FACOG Attending Obstetrician & Gynecologist Center for [facility], [facility]

## 2023-07-26 NOTE — Progress Notes (Signed)
 Pt presents for post op visit. Pt has no questions or concerns at this time.

## 2023-08-05 ENCOUNTER — Other Ambulatory Visit: Payer: Self-pay | Admitting: Nurse Practitioner

## 2023-08-16 ENCOUNTER — Other Ambulatory Visit: Payer: Self-pay | Admitting: Adult Health

## 2023-08-16 DIAGNOSIS — J449 Chronic obstructive pulmonary disease, unspecified: Secondary | ICD-10-CM

## 2023-09-28 ENCOUNTER — Other Ambulatory Visit: Payer: Self-pay | Admitting: Gastroenterology

## 2023-10-01 ENCOUNTER — Other Ambulatory Visit: Payer: Self-pay | Admitting: Gastroenterology

## 2023-11-14 ENCOUNTER — Telehealth: Payer: Self-pay

## 2023-11-14 NOTE — Telephone Encounter (Signed)
**Note De-Identified [place] Art Obfuscation** Per the [employer] Part A and B website: [employer] does not require prior authorization for CPT Code: 04188 (CPAP Titration).  I have transferred the order to the sleep lab.

## 2023-11-15 ENCOUNTER — Ambulatory Visit (HOSPITAL_COMMUNITY)
Admission: RE | Admit: 2023-11-15 | Discharge: 2023-11-15 | Disposition: A | Discharge status: Home / Self Care | Payer: Medicare Other | Source: Ambulatory Visit | Attending: Obstetrics and Gynecology | Admitting: Obstetrics and Gynecology

## 2023-11-15 DIAGNOSIS — N838 Other noninflammatory disorders of ovary, fallopian tube and broad ligament: Secondary | ICD-10-CM | POA: Diagnosis present

## 2023-11-20 ENCOUNTER — Ambulatory Visit: Payer: Self-pay | Admitting: Family Medicine

## 2023-11-30 ENCOUNTER — Other Ambulatory Visit: Payer: Self-pay | Admitting: Cardiology

## 2023-12-20 ENCOUNTER — Ambulatory Visit (HOSPITAL_BASED_OUTPATIENT_CLINIC_OR_DEPARTMENT_OTHER): Attending: Cardiology | Admitting: Cardiology

## 2023-12-20 DIAGNOSIS — I4719 Other supraventricular tachycardia: Secondary | ICD-10-CM | POA: Insufficient documentation

## 2023-12-20 DIAGNOSIS — I10 Essential (primary) hypertension: Secondary | ICD-10-CM | POA: Diagnosis present

## 2023-12-20 DIAGNOSIS — I350 Nonrheumatic aortic (valve) stenosis: Secondary | ICD-10-CM | POA: Diagnosis not present

## 2023-12-20 DIAGNOSIS — J449 Chronic obstructive pulmonary disease, unspecified: Secondary | ICD-10-CM | POA: Diagnosis not present

## 2023-12-20 DIAGNOSIS — R0609 Other forms of dyspnea: Secondary | ICD-10-CM | POA: Diagnosis not present

## 2023-12-20 DIAGNOSIS — I359 Nonrheumatic aortic valve disorder, unspecified: Secondary | ICD-10-CM

## 2023-12-20 DIAGNOSIS — I2510 Atherosclerotic heart disease of native coronary artery without angina pectoris: Secondary | ICD-10-CM | POA: Diagnosis not present

## 2023-12-20 DIAGNOSIS — G4733 Obstructive sleep apnea (adult) (pediatric): Secondary | ICD-10-CM | POA: Diagnosis present

## 2023-12-20 DIAGNOSIS — E7800 Pure hypercholesterolemia, unspecified: Secondary | ICD-10-CM | POA: Insufficient documentation

## 2023-12-21 NOTE — Procedures (Signed)
  Indications for Polysomnography The patient is a 68 year old Female who is 5' 3 and weighs 199.0 lbs. Her BMI equals 34.8.  A full night titration treatment study was performed.  Patient reported taking her medications at 7:30 pm.VALSARTANMETFORMINEZETIMIBEDILTIAZEMCLONIDINE Polysomnogram Data A full night polysomnogram recorded the standard physiologic parameters including EEG, EOG, EMG, EKG, nasal and oral airflow.  Respiratory parameters of chest and abdominal movements were recorded with Respiratory Inductance Plethysmography belts.   Oxygen saturation was recorded by pulse oximetry.  Sleep Architecture The total recording time of the polysomnogram was 362.6 minutes.  The total sleep time was 284.0 minutes.  The patient spent 8.1% of total sleep time in Stage N1, 72.9% in Stage N2, 0.2% in Stages N3, and 18.8% in REM.  Sleep latency was 46.9 minutes.   REM latency was 114.5 minutes.  Sleep Efficiency was 78.3%.  Wake after Sleep Onset time was 31.5 minutes.  Titration Summary The patient was titrated at pressures ranging from 6 cm/H20 up to 16 cm/H20. The last pressure used in the study was 16cm/H2O.  Respiratory Events The polysomnogram revealed a presence of 0 obstructive, 1 central, and 0 mixed apneas resulting in an Apnea index of 0.2 events per hour.  There were 27 hypopneas (GreaterEqual to3% desaturation and/or arousal) resulting in an Apnea\Hypopnea Index (AHI  GreaterEqual to3% desaturation and/or arousal) of 5.9 events per hour.  There were 6 hypopneas (GreaterEqual to4% desaturation) resulting in an Apnea\Hypopnea Index (AHI GreaterEqual to4% desaturation) of 1.5 events per hour.  There were 4 Respiratory  Effort Related Arousals resulting in a RERA index of 0.8 events per hour. The Respiratory Disturbance Index is 6.8 events per hour.  The snore index was 0 events per hour.  Mean oxygen saturation was 93.6%.  The lowest oxygen saturation during sleep was 87.0%.  Time spent  LessEqual to88% oxygen saturation was  minutes ().  Limb Activity There were 0 limb movements recorded.  Cardiac Summary The average pulse rate was 60.9 bpm.  The minimum pulse rate was 53.0 bpm while the maximum pulse rate was 74.0 bpm.  Cardiac rhythm was normal.  Diagnosis: Obstructive Sleep Apnea  Recommendations: 1. Recommend a trial o fResMed  CPAP at 16cm H2O with heated humidity and large [employer] Evora face mask. 2. Close follow-up is necessary to ensure success with CPAP or oral appliance therapy for maximum benefit. 3. A follow-up oximetry study on CPAP is recommended to assess the adequacy of therapy and determine the need for supplemental oxygen or the potential need for Bi-level therapy.  An arterial blood gas to determine the adequacy of baseline ventilation and  oxygenation should also be considered. 4. Healthy sleep recommendations include:  adequate nightly sleep (normal 7-9 hrs/night), avoidance of caffeine after noon and alcohol near bedtime, and maintaining a sleep environment that is cool, dark and quiet. 5. Weight loss for overweight patients is recommended.  Even modest amounts of weight loss can significantly improve the severity of sleep apnea. 6. Snoring recommendations include:  weight loss where appropriate, side sleeping, and avoidance of alcohol before bed. 7. Operation of motor vehicle should be avoided when sleepy.    This study was personally reviewed and electronically signed by: Dr. Wilbert Bihari Accredited Board Certified in Sleep Medicine Date/Time: 12/21/2023 3:58PM

## 2024-01-17 ENCOUNTER — Telehealth: Payer: Self-pay | Admitting: *Deleted

## 2024-01-17 NOTE — Telephone Encounter (Signed)
 The patient has been notified of the result and verbalized understanding.  All questions (if any) were answered. Joshua Dalton Seip, CMA 01/17/2024 10:27 AM    Upon patient request DME selection is [facility]. Patient understands he will be contacted by [facility] to set up his cpap. Patient understands to call if [facility] does not contact him with new setup in a timely manner. Patient understands they will be called once confirmation has been received from [facility]/ that they have received their new machine to schedule 10 week follow up appointment.   [facility] notified of new cpap order  Please add to airview Patient was grateful for the call and thanked me.

## 2024-01-17 NOTE — Telephone Encounter (Signed)
-----   Message from Wilbert Bihari sent at 12/21/2023  3:59 PM EDT ----- Please let patient know that they had a successful PAP titration and let DME know that orders are in EPIC.  Please set up 6 week OV with me.

## 2024-01-19 ENCOUNTER — Other Ambulatory Visit: Payer: Self-pay

## 2024-01-19 ENCOUNTER — Ambulatory Visit: Admitting: Cardiology

## 2024-01-19 DIAGNOSIS — Z79899 Other long term (current) drug therapy: Secondary | ICD-10-CM

## 2024-01-19 DIAGNOSIS — E7800 Pure hypercholesterolemia, unspecified: Secondary | ICD-10-CM

## 2024-01-19 DIAGNOSIS — I2510 Atherosclerotic heart disease of native coronary artery without angina pectoris: Secondary | ICD-10-CM

## 2024-01-19 LAB — ALT: ALT: 20 IU/L (ref 0–32)

## 2024-01-20 ENCOUNTER — Ambulatory Visit: Payer: Self-pay | Admitting: Cardiology

## 2024-01-20 LAB — LIPID PANEL
Chol/HDL Ratio: 2.6 ratio (ref 0.0–4.4)
Cholesterol, Total: 126 mg/dL (ref 100–199)
HDL: 48 mg/dL (ref >39)
LDL Chol Calc (NIH): 64 mg/dL (ref 0–99)
Triglycerides: 69 mg/dL (ref 0–149)
VLDL Cholesterol Cal: 14 mg/dL (ref 5–40)

## 2024-01-25 ENCOUNTER — Telehealth: Payer: Self-pay | Admitting: *Deleted

## 2024-01-25 NOTE — Telephone Encounter (Signed)
-----   Message from Nurse Geni E sent at 01/19/2024  9:36 AM EDT ----- Regarding: RE: Established patient question Patient was notified of her successful cpap titration on 01/17/24, looks like she was advised that a new cpap would  be ordered for her. She tells me that her current cpap machine is 67 years old. The cpap titration was ordered in March 2025 and completed in August. She was really upset that we rescheduled her visit today since obviously there's no way she would have her cpap machine, I tried to explain to her that unfortunately because the cpap titration took so long to get done she'd be better off coming to do her yearly cardiology/sleep appointment at the same time as her compliance visit. She asked me several times why it took so long to get the cpap titration approved/set up and why it is taking so long to get her new cpap machine. If someone has a moment could you call and maybe explain how long it might take to get her cpap machine with her insurance? She's currently scheduled for 03/23/24.  Geni, RN ----- Message ----- From: Shlomo Wilbert SAUNDERS, MD Sent: 01/16/2024  12:59 PM EDT To: Geni LITTIE Sar, RN Subject: RE: Established patient question               yes, please reschedule until she has gotten her CPAP and please check with me to find out what the holdup is ----- Message ----- From: Sar Geni LITTIE, RN Sent: 01/16/2024  10:31 AM EDT To: Wilbert SAUNDERS Shlomo, MD Subject: Established patient question                   Patient is on your schedule for Thursday 9/18, appears to be an established patient. This is a yearly f/u appt but it looks like she recently had a sleep study as well. The appt notes say she has not received a cpap yet. If that's true do you want to reschedule her for after cpap delivery, or do you want to go ahead and see her for cardiology follow up now? E

## 2024-01-25 NOTE — Telephone Encounter (Signed)
 Reached out to patient Robin Clayton.

## 2024-01-27 NOTE — Telephone Encounter (Signed)
 Reached out to patient lmtcb.

## 2024-01-27 NOTE — Telephone Encounter (Signed)
 Return Call: Spoke with the patient and explained to her the process that we have and then process of the dme. Patient was grateful for the call and thanked me.

## 2024-02-24 ENCOUNTER — Other Ambulatory Visit: Payer: Self-pay | Admitting: Cardiology

## 2024-02-24 ENCOUNTER — Telehealth: Payer: Self-pay | Admitting: Cardiology

## 2024-02-24 MED ORDER — ATORVASTATIN CALCIUM 80 MG PO TABS: 80.0000 mg | ORAL_TABLET | Freq: Every day | ORAL | 0 refills | Status: DC

## 2024-02-24 NOTE — Telephone Encounter (Signed)
 RX sent in

## 2024-02-24 NOTE — Telephone Encounter (Signed)
*  STAT* If patient is at the pharmacy, call can be transferred to refill team.   1. Which medications need to be refilled? (please list name of each medication and dose if known)   atorvastatin  (LIPITOR) 80 MG tablet     2. Would you like to learn more about the convenience, safety, & potential cost savings by using the [facility]? No   3. Are you open to using the [facility] (Type [facility].) No   4. Which pharmacy/location (including street and city if local pharmacy) is medication to be sent to?  [id number] - [place], [place] - [address] AT [address]   5. Do they need a 30 day or 90 day supply? 30 day  Pt has only 1 tablet left, has app on 11/21

## 2024-03-03 IMAGING — US US PELVIS COMPLETE WITH TRANSVAGINAL
1 series · 13 of 25 positions shown · non-contrast
Comparison: None

CLINICAL DATA: Postmenopausal bleeding single episode a few weeks
ago



[Series 1: us pelvic complete with transvaginal · 56 acquisitions, 13 frames shown]
[im 1/56]
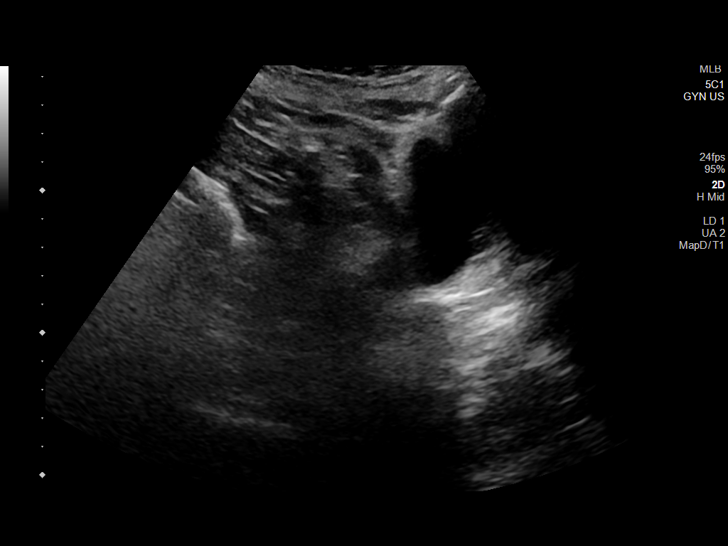
[im 5/56]
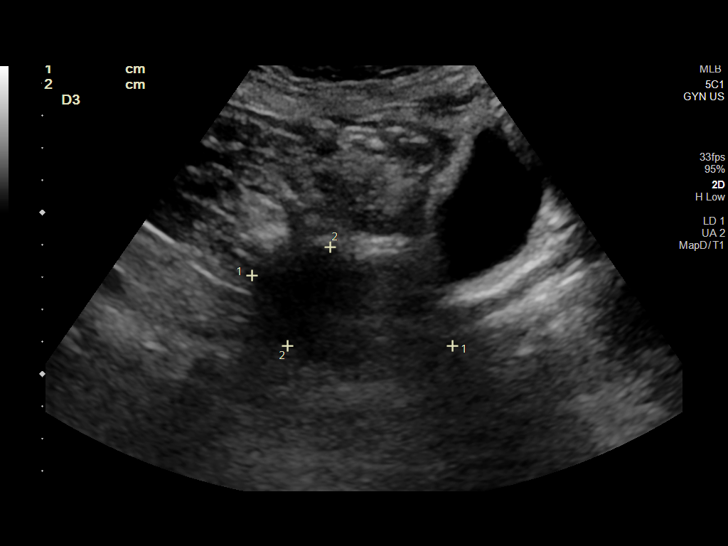
[im 10/56]
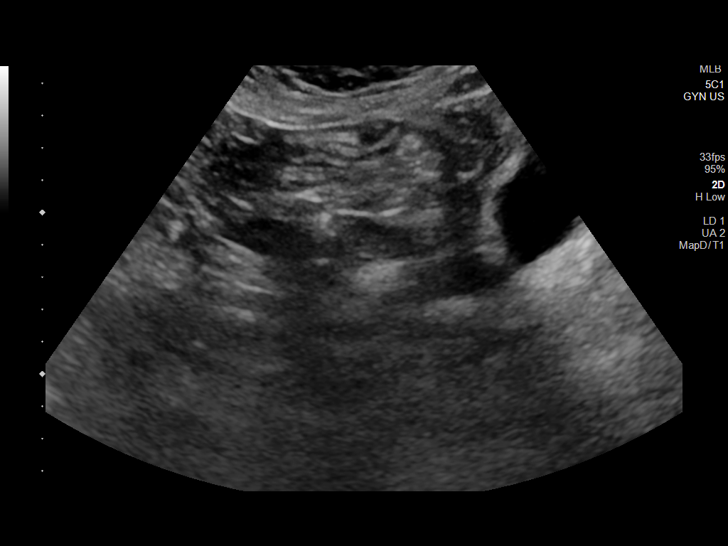
[im 14/56]
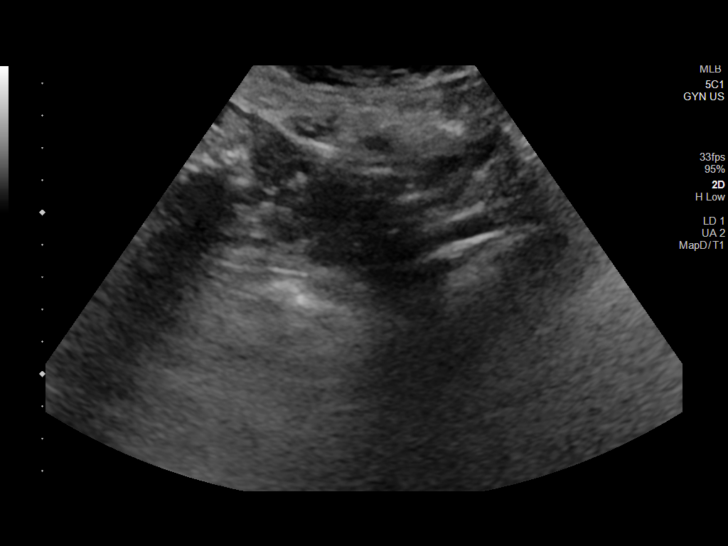
[im 19/56]
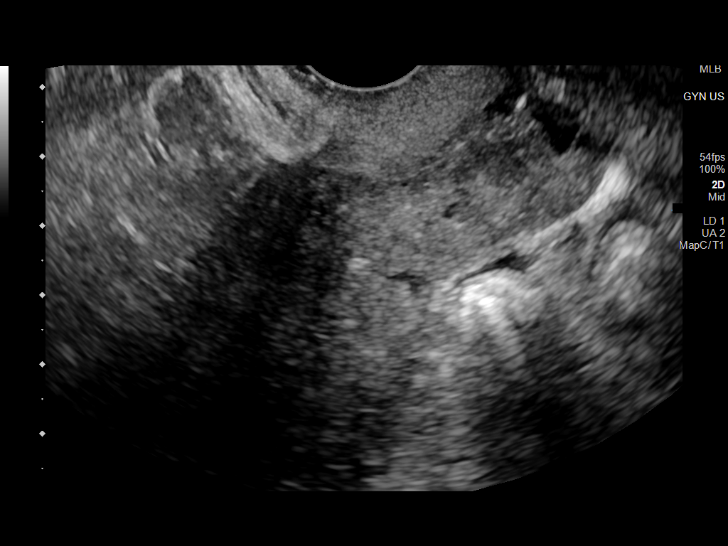
[im 23/56]
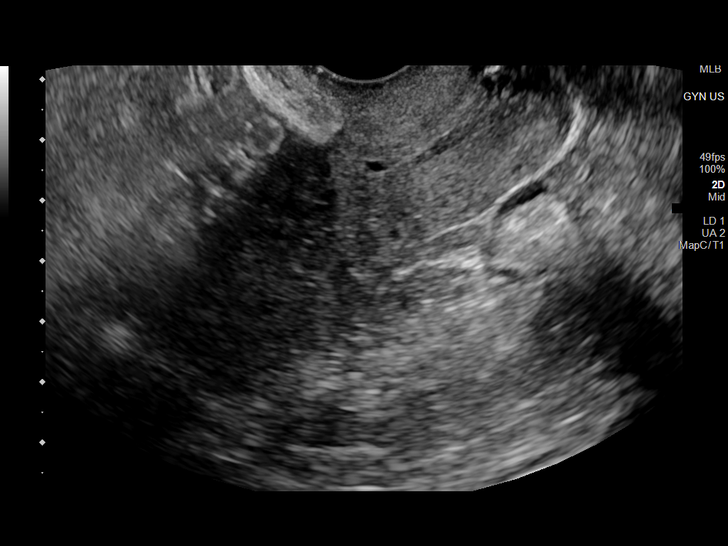
[im 28/56]
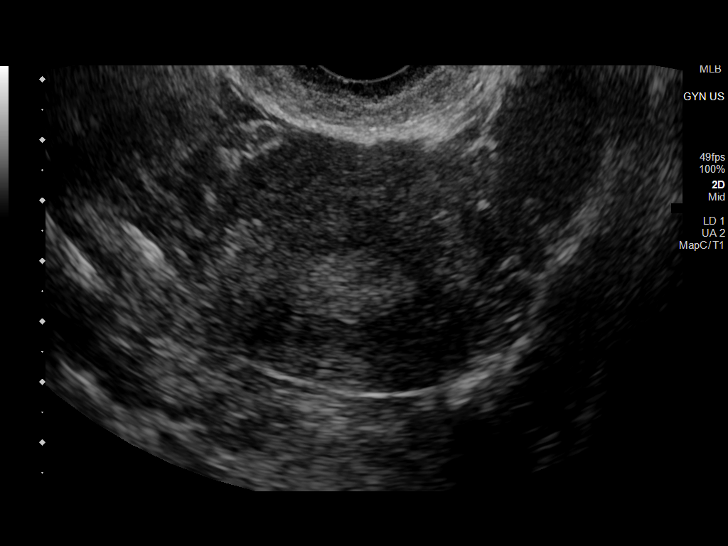
[im 33/56]
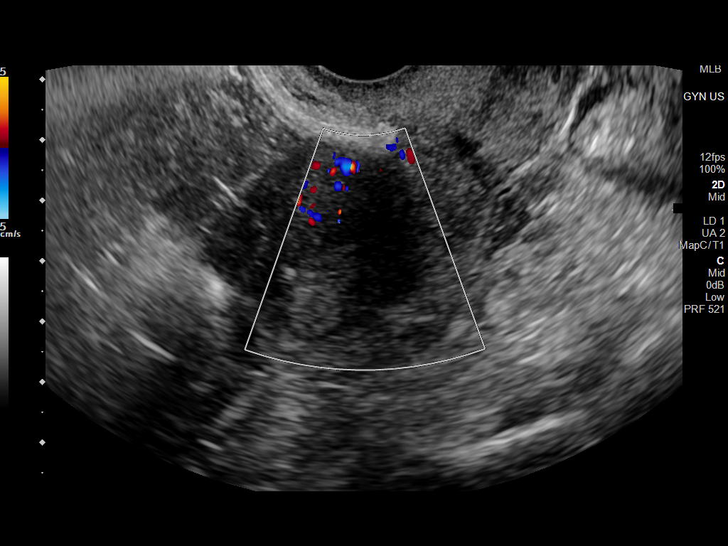
[im 37/56]
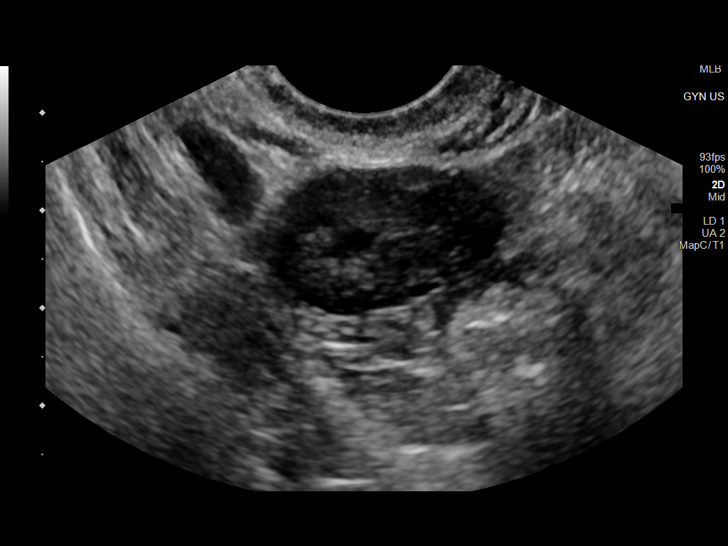
[im 42/56]
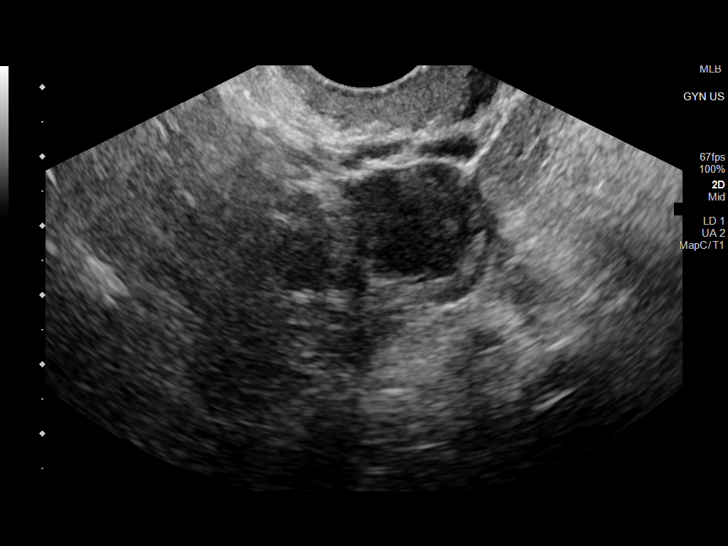
[im 46/56]
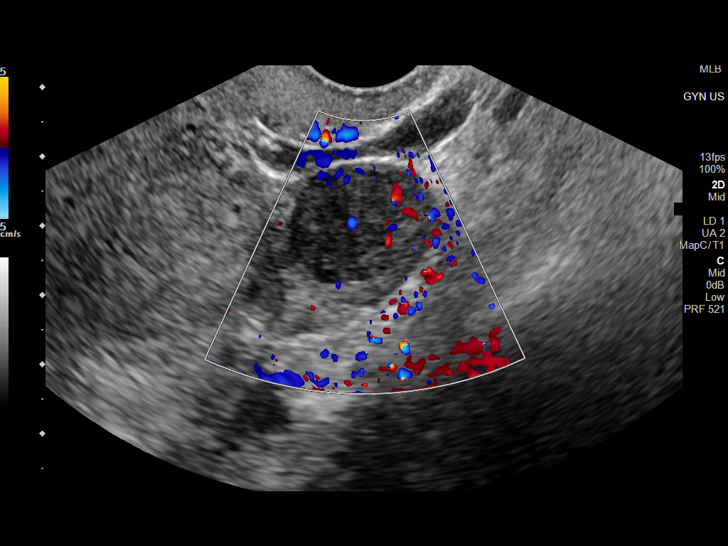
[im 51/56]
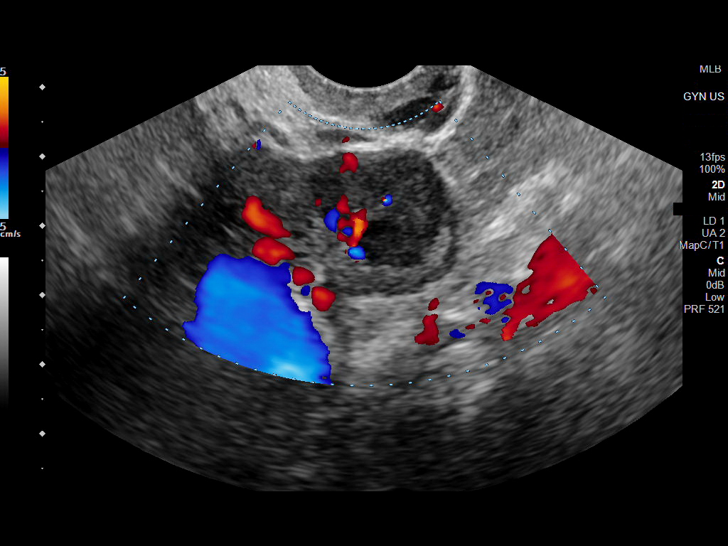
[im 56/56]
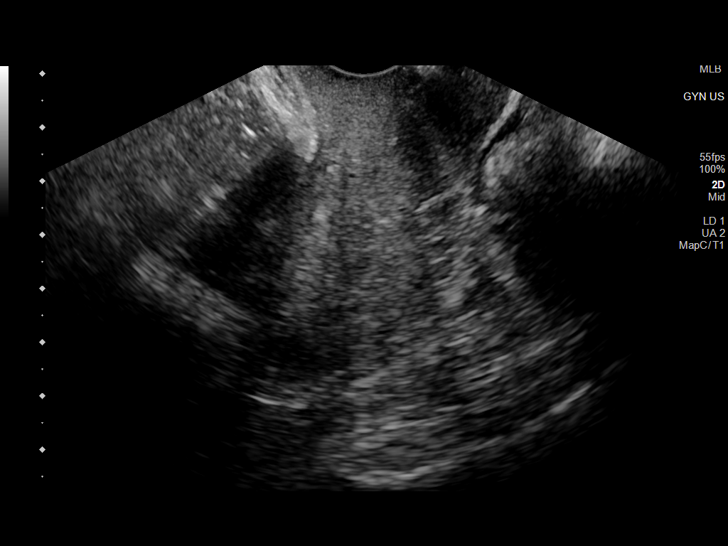

[13 of 25 positions shown; findings below may reference images not displayed]

FINDINGS: Uterus

Measurements: 7.9 x 3.9 x 4.8 cm = volume: 77 mL. Anteverted.
Heterogeneous myometrium. No focal mass.

Endometrium

Thickness: 14 mm. Abnormal, thickened, heterogeneous. No endometrial
fluid.

Right ovary

Measurements: 2.9 x 1.5 x 2.5 cm = volume: 5.6 mL. Normal morphology
without mass

Left ovary

Measurements: 2.3 x 3.2 x 3.0 cm = volume: 11.3 mL. Small slightly
hypoechoic nodule within LEFT ovary 17 x 18 x 20 mm, demonstrates
internal blood flow on color Doppler imaging, consistent with a
solid mass. No cystic components or calcifications.

Other findings

No free pelvic fluid.  No adnexal masses.
IMPRESSION: Abnormally thickened heterogeneous endometrial complex 14 mm thick;
In the setting of post-menopausal bleeding, endometrial sampling is
indicated to exclude carcinoma. If results are benign,
sonohysterogram should be considered for focal lesion work-up. (Ref:
Radiological Reasoning: Algorithmic Workup of Abnormal Vaginal
Bleeding with Endovaginal Sonography and Sonohysterography. AJR
4002; 191:S68-73)

20 mm solid nodule with internal blood flow within the LEFT ovary
consistent with a solid tumor; surgical evaluation recommended to
exclude ovarian neoplasm.

These results will be called to the ordering clinician or
representative by the Radiologist Assistant, and communication
documented in the PACS or [REDACTED].

## 2024-03-15 ENCOUNTER — Telehealth: Payer: Self-pay

## 2024-03-23 ENCOUNTER — Ambulatory Visit: Admitting: Cardiology

## 2024-03-26 ENCOUNTER — Ambulatory Visit: Admitting: Cardiology

## 2024-03-26 ENCOUNTER — Encounter: Payer: Self-pay | Admitting: Cardiology

## 2024-03-26 ENCOUNTER — Ambulatory Visit: Attending: Cardiology | Admitting: Cardiology

## 2024-03-26 VITALS — BP 152/70 | HR 70 | Ht 64.0 in | Wt 206.2 lb

## 2024-03-26 DIAGNOSIS — R0600 Dyspnea, unspecified: Secondary | ICD-10-CM | POA: Diagnosis present

## 2024-03-26 DIAGNOSIS — E7800 Pure hypercholesterolemia, unspecified: Secondary | ICD-10-CM | POA: Diagnosis present

## 2024-03-26 DIAGNOSIS — I4719 Other supraventricular tachycardia: Secondary | ICD-10-CM | POA: Diagnosis not present

## 2024-03-26 DIAGNOSIS — I2510 Atherosclerotic heart disease of native coronary artery without angina pectoris: Secondary | ICD-10-CM | POA: Diagnosis not present

## 2024-03-26 DIAGNOSIS — G4733 Obstructive sleep apnea (adult) (pediatric): Secondary | ICD-10-CM | POA: Diagnosis not present

## 2024-03-26 DIAGNOSIS — Z79899 Other long term (current) drug therapy: Secondary | ICD-10-CM | POA: Insufficient documentation

## 2024-03-26 DIAGNOSIS — I10 Essential (primary) hypertension: Secondary | ICD-10-CM | POA: Diagnosis not present

## 2024-03-26 MED ORDER — CLONIDINE HCL 0.2 MG PO TABS: 0.2000 mg | ORAL_TABLET | Freq: Two times a day (BID) | ORAL | 3 refills | Status: AC

## 2024-03-26 MED ORDER — CHLORTHALIDONE 25 MG PO TABS: 25.0000 mg | ORAL_TABLET | Freq: Every day | ORAL | 3 refills | Status: AC

## 2024-03-26 NOTE — Patient Instructions (Signed)
 Medication Instructions:  Please STOP hydrochlorothiazide .  Please START taking chlorthalidone  25 mg daily.  Please START taking clonidine  0.2 mg twice a day.  *If you need a refill on your cardiac medications before your next appointment, please call your pharmacy*  Lab Work: Please complete a BMET at any [facility] in one week. You do not need to be fasting.  If you have labs (blood work) drawn today and your tests are completely normal, you will receive your results only by: MyChart Message (if you have MyChart) OR A paper copy in the mail If you have any lab test that is abnormal or we need to change your treatment, we will call you to review the results.  Testing/Procedures: Your physician has requested that you have an echocardiogram. Echocardiography is a painless test that uses sound waves to create images of your heart. It provides your doctor with information about the size and shape of your heart and how well your heart's chambers and valves are working. This procedure takes approximately one hour. There are no restrictions for this procedure. Please do NOT wear cologne, perfume, aftershave, or lotions (deodorant is allowed). Please arrive 15 minutes prior to your appointment time.  Please note: We ask at that you not bring children with you during ultrasound (echo/ vascular) testing. Due to room size and safety concerns, children are not allowed in the ultrasound rooms during exams. Our front office staff cannot provide observation of children in our lobby area while testing is being conducted. An adult accompanying a patient to their appointment will only be allowed in the ultrasound room at the discretion of the ultrasound technician under special circumstances. We apologize for any inconvenience.     Please report to Radiology at the [facility] Main Entrance 30 minutes early for your test.  [address] [place], [place] [address]                         OR    Please report to Radiology at [facility] Main Entrance, medical mall, 30 mins prior to your test.  [address]  [place], [place]  How to Prepare for Your Cardiac PET/CT Stress Test:  Nothing to eat or drink, except water, 3 hours prior to arrival time.  NO caffeine/decaffeinated products, or chocolate 12 hours prior to arrival. (Please note decaffeinated beverages (teas/coffees) still contain caffeine).  If you have caffeine within 12 hours prior, the test will need to be rescheduled.  Medication instructions: Do not take erectile dysfunction medications for 72 hours prior to test (sildenafil, tadalafil) Do not take nitrates (isosorbide mononitrate, Ranexa) the day before or day of test Do not take tamsulosin the day before or morning of test Hold theophylline containing medications for 12 hours. Hold Dipyridamole 48 hours prior to the test.  Diabetic Preparation: If able to eat breakfast prior to 3 hour fasting, you may take all medications, including your insulin . Do not worry if you miss your breakfast dose of insulin  - start at your next meal. If you do not eat prior to 3 hour fast-Hold all diabetes (oral and insulin ) medications. Patients who wear a continuous glucose monitor MUST remove the device prior to scanning.  You may take your remaining medications with water.  NO perfume, cologne or lotion on chest or abdomen area. FEMALES - Please avoid wearing dresses to this appointment.  Total time is 1 to 2 hours; you may want to bring  reading material for the waiting time.  IF YOU THINK YOU MAY BE PREGNANT, OR ARE NURSING PLEASE INFORM THE TECHNOLOGIST.  In preparation for your appointment, medication and supplies will be purchased.  Appointment availability is limited, so if you need to cancel or reschedule, please call the Radiology Department Scheduler at [phone] 24 hours in advance to avoid a cancellation fee of $100.00  What to Expect  When you Arrive:  Once you arrive and check in for your appointment, you will be taken to a preparation room within the Radiology Department.  A technologist or Nurse will obtain your medical history, verify that you are correctly prepped for the exam, and explain the procedure.  Afterwards, an IV will be started in your arm and electrodes will be placed on your skin for EKG monitoring during the stress portion of the exam. Then you will be escorted to the PET/CT scanner.  There, staff will get you positioned on the scanner and obtain a blood pressure and EKG.  During the exam, you will continue to be connected to the EKG and blood pressure machines.  A small, safe amount of a radioactive tracer will be injected in your IV to obtain a series of pictures of your heart along with an injection of a stress agent.    After your Exam:  It is recommended that you eat a meal and drink a caffeinated beverage to counter act any effects of the stress agent.  Drink plenty of fluids for the remainder of the day and urinate frequently for the first couple of hours after the exam.  Your doctor will inform you of your test results within 7-10 business days.  For more information and frequently asked questions, please visit our website: [email or web address]  For questions about your test or how to prepare for your test, please call: Cardiac Imaging Nurse Navigators Office: [phone]   Follow-Up: At [facility], you and your health needs are our priority.  As part of our continuing mission to provide you with exceptional heart care, our providers are all part of one team.  This team includes your primary Cardiologist (physician) and Advanced Practice Providers or APPs (Physician Assistants and Nurse Practitioners) who all work together to provide you with the care you need, when you need it.  Your next appointment:   1 year(s)  Provider:   Wilbert Bihari, MD    We recommend signing up  for the patient portal called MyChart.  Sign up information is provided on this After Visit Summary.  MyChart is used to connect with patients for Virtual Visits (Telemedicine).  Patients are able to view lab/test results, encounter notes, upcoming appointments, etc.  Non-urgent messages can be sent to your provider as well.   To learn more about what you can do with MyChart, go to [email or web address].   Other Instructions Please check your blood pressure twice a day for one week. Check your blood pressure once at lunch and once at dinner. Write down your blood pressure readings along with the date and time of each reading, as well as a heart rate if your machine provides that information. Then, drop off your readings at our front desk, send a picture of the log over Mychart or send the readings to us  in the mail. You can also call our main phone number [phone]) as all of our operators are trained to take down your blood pressure readings.

## 2024-03-26 NOTE — Addendum Note (Signed)
 Addended by: SHLOMO CORNING R on: 03/26/2024 05:04 PM   Modules accepted: Orders

## 2024-03-26 NOTE — Addendum Note (Signed)
 Addended by: JANIT GENI CROME on: 03/26/2024 05:02 PM   Modules accepted: Orders

## 2024-03-26 NOTE — Addendum Note (Signed)
 Addended by: JANIT GENI CROME on: 03/26/2024 03:43 PM   Modules accepted: Orders

## 2024-03-26 NOTE — Progress Notes (Signed)
 Date:  03/26/2024   ID:  Robin Clayton, DOB 11-02-1955, MRN [id number]   PCP:  Pura Lenis, MD  Cardiologist:  Wilbert Bihari, MD  Electrophysiologist:  None   Chief Complaint:  OSA, HTN  History of Present Illness:      Robin Clayton is a 68 y.o. 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 She has history of palpitations with event monitor in 10/2017 demonstrating nonsustained atrial tachycardia up to 25 beats, sinus bradycardia to sinus tachycardia (30->130bpm), transient complete heart block with ventricular standstill for up to 4 seconds.   She was intolerant to CPAP and was on nocturnal O2.  She could not afford the oral device and refused the Inspire device.  She was interested in getting back on PAP so we placed her back on auto settings from 4 to 18cm H2o.   Dr. 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  She retried CPAP but could not tolerate it and I referred her back to Dr. Melba per her request for oral device. She decided that she did not want to go to Dr. Melba.  She then went to a health program at [facility] and lost significant weight and her PCP told her she no longer needed CPAP based on follow-ups sleep study. She has been using O2 at night for many years ( she does not know how she got on it).  She then gained all her weight back and her DME told her she could not have O2 at night until she had another sleep study done.    Coronary CTA 12/03/2021 demonstrated  CAC score 792 (98 percentile), distal LCx severe plaque (>70), distal RCA moderate plaque (50-69), LAD minimal plaque (<25), aortic atherosclerosis; FFR normal-consistent with nonobstructive coronary artery disease.    She was seen back by Glendia Ferrier in March 2024 for and was doing well.   She was not having any anginal symptoms at that time.  Due to a heart murmur noted on exam a 2D echo was done which demonstrated EF 60 to 65% with G1 DD and moderate aortic valve sclerosis with no stenosis.  And mean aortic valve gradient 5 mmHg  She underwent repeat sleep study in March 2025 which demonstrated mild obstructive sleep apnea with an overall AHI of 7.7/h but severe during REM sleep with a REM AHI 34.5/h along with nocturnal hypoxemia.  She underwent CPAP titration and was started on CPAP at 16 cm H2O.  She is now here today for follow-up and is doing well.  She has chronic SOB related to COPD that has gotten worse recently with activity.  She is followed by Pulmonary but has not seen them recently.  She denies any chest pain or pressure, PND, orthopnea, dizziness, palpitations, syncope.  She occasionally has some mild LE edema.    She is doing well with her PAP device and thinks that she has gotten used to it.  She tolerates the under the nose full face mask and feels the pressure is adequate.  Since going on PAP she feels rested in the am and has no significant daytime sleepiness.  She does have problems with mouth dryness. She denies any significant nasal dryness or nasal congestion.  An Epworth Sleepiness Scale score was calculated the office today and this endorsed  at 0 arguing against residual daytime sleepiness. Patient denies any episodes of bruxism, restless legs, No gagging hallucinations or cataplectic events.     Prior CV studies:   The following studies were reviewed today:  None  Past Medical History:  Diagnosis Date   Anemia    Anxiety    Atrial tachycardia    Bradycardia    Chronic bronchitis (HCC)    Chronic lower back pain    CKD (chronic kidney disease), stage III (HCC)    Complete heart block (HCC)    a. event monitor in 10/2017 demonstrating nonsustained atrial tachycardia up to 25 beats, sinus bradycardia to sinus tachycardia (30->130bpm), transient complete  heart block with ventricular standstill for up to 4 seconds.   Constipation    COPD (chronic obstructive pulmonary disease) (HCC)    Coronary artery disease    CCTA w/ FFR 12/03/21: CAC 792 (98th percentile). >70% dLCx, 50-69% dRCA, <25% LAD, non-obstructive CAD by FFR   COVID    2020   GERD (gastroesophageal reflux disease)    takes tums occ   Hyperlipidemia    Hypertension    Lung nodule    RML 05/11/23 CT   Morbid obesity (HCC)    Obstructive sleep apnea    moderate OSA with AHI 24/hr intolerant to CPAP and could not afford oral device   Sleep apnea    Type II diabetes mellitus (HCC)    Upper airway cough syndrome    query of mild < 10% tracheal stenosis 01/24/23 transansal fiberoptic laryngoscopy   Past Surgical History:  Procedure Laterality Date   BREAST BIOPSY  12/06/2011   Procedure: BREAST BIOPSY WITH NEEDLE LOCALIZATION;  Surgeon: Donnice Bury, MD;  Location: [facility] OR;  Service: General;  Laterality: Left;   CESAREAN SECTION  1970   HYSTEROSCOPY WITH D & C N/A 06/28/2023   Procedure: DILATATION AND CURETTAGE /HYSTEROSCOPY AND POLYP RESECTION;  Surgeon: Zina Jerilynn LABOR, MD;  Location: [facility] OR;  Service: Gynecology;  Laterality: N/A;   JOINT REPLACEMENT     REVISION TOTAL HIP ARTHROPLASTY Bilateral      Current Meds  Medication Sig   acetaminophen  (TYLENOL ) 500 MG tablet Take 1,000 mg by mouth every 6 (six) hours as needed for mild pain.   albuterol  (PROVENTIL ) (2.5 MG/3ML) 0.083% nebulizer solution Take 2.5 mg by nebulization every 4 (four) hours as needed (shortness of breath).    albuterol  (VENTOLIN  HFA) 108 (90 Base) MCG/ACT inhaler Inhale 1-2 puffs into the lungs every 6 (six) hours as needed for wheezing or shortness of breath.   allopurinol (ZYLOPRIM) 100 MG tablet Take 100 mg by mouth daily.   aspirin  81 MG tablet Take 81 mg by mouth daily.   atorvastatin  (LIPITOR) 80 MG tablet Take 1 tablet (80 mg total) by mouth daily.   chlorthalidone  (HYGROTON ) 25 MG tablet Take  25 mg by mouth daily.   cloNIDine  (CATAPRES ) 0.1 MG tablet Take 0.1-0.2 mg by mouth See admin instructions. 0.1 mg in the morning, and 0.2mg  at bedtime   diltiazem  (CARDIZEM  CD) 240 MG 24 hr capsule Take 240 mg by mouth at bedtime.   ezetimibe  (ZETIA ) 10 MG tablet TAKE 1 TABLET(10 MG) BY MOUTH DAILY   ferrous sulfate  (FEROSUL) 325 (65 FE) MG tablet Take 1 tablet (325 mg total) by mouth every other day. Please call [phone] to schedule an office visit for more refills   hydrochlorothiazide  (MICROZIDE ) 12.5 MG capsule Take 12.5 mg by mouth daily.   metFORMIN (GLUCOPHAGE) 500 MG tablet  Take 500 mg by mouth See admin instructions. 500mg  in the afternoon and 500mg  at bedtime   montelukast  (SINGULAIR ) 10 MG tablet TAKE 1 TABLET(10 MG) BY MOUTH DAILY   pantoprazole  (PROTONIX ) 40 MG tablet Take 40 mg by mouth every morning.   SYMBICORT  80-4.5 MCG/ACT inhaler INHALE 2 PUFFS BY MOUTH FIRST THING IN THE MORNING THEN INHALE 2 PUFFS BY MOUTH 12 HOURS LATER   valsartan  (DIOVAN ) 160 MG tablet Take 160 mg by mouth at bedtime.     Allergies:   Wound dressings, Electrodes 25mm [tens therapy replace back pads], and Wound dressing adhesive   Social History   Tobacco Use   Smoking status: Former    Current packs/day: 0.00    Average packs/day: 0.5 packs/day for 38.0 years (19.0 ttl pk-yrs)    Types: Cigarettes    Start date: 07/02/1982    Quit date: 07/01/2020    Years since quitting: 3.7   Smokeless tobacco: Never  Vaping Use   Vaping status: Never Used  Substance Use Topics   Alcohol use: No   Drug use: No     Family Hx: The patient's family history includes Asthma in her sister; Cancer in her mother; Diabetes in her father and mother; Heart attack in her father; Hyperlipidemia in her mother; Hypertension in her father and mother; Kidney disease in her father and sister; Stomach cancer (age of onset: 54) in her mother. There is no history of Colon cancer, Colon polyps, Esophageal cancer, or Rectal  cancer.  ROS:   Please see the history of present illness.     All other systems reviewed and are negative.   Labs/Other Tests and Data Reviewed:    Recent Labs: 06/28/2023: BUN 28; Creatinine, Ser 1.52; Hemoglobin 11.5; Platelets 231; Potassium 4.0; Sodium 139 01/19/2024: ALT 20   Recent Lipid Panel Lab Results  Component Value Date/Time   CHOL 126 01/19/2024 11:21 AM   TRIG 69 01/19/2024 11:21 AM   HDL 48 01/19/2024 11:21 AM   CHOLHDL 2.6 01/19/2024 11:21 AM   CHOLHDL 3.2 01/05/2010 03:55 AM   LDLCALC 64 01/19/2024 11:21 AM    Wt Readings from Last 3 Encounters:  03/26/24 206 lb 3.2 oz (93.5 kg)  12/20/23 199 lb (90.3 kg)  07/26/23 205 lb 3.2 oz (93.1 kg)     Objective:    Vital Signs:  BP (!) 152/70   Pulse 70   Ht 5' 4 (1.626 m)   Wt 206 lb 3.2 oz (93.5 kg)   LMP 04/30/2012   SpO2 99%   BMI 35.39 kg/m    GEN: Well nourished, well developed in no acute distress HEENT: Normal NECK: No JVD; No carotid bruits LYMPHATICS: No lymphadenopathy CARDIAC:RRR, no murmurs, rubs, gallops RESPIRATORY:  Clear to auscultation without rales, wheezing or rhonchi  ABDOMEN: Soft, non-tender, non-distended MUSCULOSKELETAL:  No edema; No deformity  SKIN: Warm and dry NEUROLOGIC:  Alert and oriented x 3 PSYCHIATRIC:  Normal affect  ASSESSMENT & PLAN:     OSA - The patient is tolerating PAP therapy well without any problems. The PAP download performed by his DME was personally reviewed and interpreted by me today and showed an AHI of 0.9 /hr on 16 cm H2O with 80% compliance in using more than 4 hours nightly.  The patient has been using and benefiting from PAP use and will continue to benefit from therapy.   HTN -BP borderline controlled today at 152/76 mmHg and has been elevated for a few weeks -Continue valsartan  160  mg daily and Cardizem  CD 240 mg daily -Stop hydrochlorothiazide  and start Chlorthalidone  >> this was suppose to have been done before but did not  happen -increase Clonidine  to 0.2mg  BID -check BP twice daily for a week and call with results -I have personally reviewed and interpreted outside labs performed by patient's PCP which showed SCr 1.52 and potassium 4 on 06/28/2023 -Repeat BMP in 1 week  Nonsustained atrial tachycardia - Maintaining normal sinus rhythm on exam - Continue Cardizem  CD 240 mg daily with as needed refills  ASCAD SOB -coronary Ca score done for SOB -Coronary CTA done 2023 showed CAC score 792 (98 percentile), distal LCx severe plaque (>70), distal RCA moderate plaque (50-69), LAD minimal plaque (<25), aortic atherosclerosis; FFR normal-consistent with nonobstructive coronary artery disease  -She has not had any chest pain but has been having worsening SOB which could be due to poorly controlled HTN -I thinks she needs a stress PET CT to rule out ischemia given worsening breathing problems>>if stress test normal then get back in with Pulmonary -Informed Consent   Shared Decision Making/Informed Consent The risks [chest pain, shortness of breath, cardiac arrhythmias, dizziness, blood pressure fluctuations, myocardial infarction, stroke/transient ischemic attack, nausea, vomiting, allergic reaction, radiation exposure, metallic taste sensation and life-threatening complications (estimated to be 1 in 10,000)], benefits (risk stratification, diagnosing coronary artery disease, treatment guidance) and alternatives of a cardiac PET stress test were discussed in detail with Ms. Coard and she agrees to proceed. -repeat 2D echo -Continue aspirin  81 mg daily, atorvastatin  80 mg daily with as needed refills  HLD -LDL goal < 70 due to DM and coronary Ca -I have personally reviewed and interpreted outside labs performed by patient's PCP which showed LDL 64, HDL 48 and ALT 20 on 01/19/2024 -Continue atorvastatin  80 mg daily, Zetia  10 mg daily with as needed refills.    Medication Adjustments/Labs and Tests Ordered: Current  medicines are reviewed at length with the patient today.  Concerns regarding medicines are outlined above.  Tests Ordered: Orders Placed This Encounter  Procedures   EKG 12-Lead   Medication Changes: No orders of the defined types were placed in this encounter.   Disposition:  Follow up 1 year  Signed, Wilbert Bihari, MD  03/26/2024 3:05 PM    [facility]

## 2024-03-26 NOTE — Addendum Note (Signed)
 Addended by: Chesnee Floren L on: 03/26/2024 03:35 PM   Modules accepted: Orders

## 2024-03-27 ENCOUNTER — Other Ambulatory Visit: Payer: Self-pay | Admitting: Physician Assistant

## 2024-03-28 ENCOUNTER — Other Ambulatory Visit: Payer: Self-pay | Admitting: Cardiology

## 2024-03-31 ENCOUNTER — Other Ambulatory Visit: Payer: Self-pay | Admitting: Cardiology

## 2024-04-03 ENCOUNTER — Ambulatory Visit: Payer: Self-pay | Admitting: Cardiology

## 2024-04-03 LAB — BASIC METABOLIC PANEL WITH GFR
BUN/Creatinine Ratio: 17 (ref 12–28)
BUN: 23 mg/dL (ref 8–27)
CO2: 23 mmol/L (ref 20–29)
Calcium: 9.5 mg/dL (ref 8.7–10.3)
Chloride: 103 mmol/L (ref 96–106)
Creatinine, Ser: 1.33 mg/dL — ABNORMAL HIGH (ref 0.57–1.00)
Glucose: 121 mg/dL — ABNORMAL HIGH (ref 70–99)
Potassium: 4.8 mmol/L (ref 3.5–5.2)
Sodium: 140 mmol/L (ref 134–144)
eGFR: 44 mL/min/1.73 — ABNORMAL LOW (ref >59)

## 2024-04-16 ENCOUNTER — Encounter (HOSPITAL_COMMUNITY): Payer: Self-pay

## 2024-04-17 ENCOUNTER — Telehealth (HOSPITAL_COMMUNITY): Payer: Self-pay | Admitting: *Deleted

## 2024-04-17 NOTE — Telephone Encounter (Signed)
 Reaching out to patient to offer assistance regarding upcoming cardiac imaging study; pt verbalizes understanding of appt date/time, parking situation and where to check in, pre-test NPO status, and verified current allergies; name and call back number provided for further questions should they arise  Robin Brick RN Navigator Cardiac Imaging [facility] [phone] office [phone] cell  Patient aware to avoid caffeine 12 hours prior to her cardiac PET study.

## 2024-04-18 ENCOUNTER — Ambulatory Visit (HOSPITAL_COMMUNITY): Admission: RE | Admit: 2024-04-18 | Source: Ambulatory Visit

## 2024-04-18 DIAGNOSIS — R0600 Dyspnea, unspecified: Secondary | ICD-10-CM | POA: Insufficient documentation

## 2024-04-18 MED ORDER — RUBIDIUM RB82 GENERATOR (RUBYFILL)
24.4900 | PACK | Freq: Once | INTRAVENOUS | Status: AC
  Administered 2024-04-18: 10:00:00 24.49 via INTRAVENOUS

## 2024-04-18 MED ORDER — RUBIDIUM RB82 GENERATOR (RUBYFILL)
24.0800 | PACK | Freq: Once | INTRAVENOUS | Status: AC
  Administered 2024-04-18: 10:00:00 24.08 via INTRAVENOUS

## 2024-04-18 MED ORDER — REGADENOSON 0.4 MG/5ML IV SOLN
INTRAVENOUS | Status: AC
  Filled 2024-04-18: qty 5

## 2024-04-18 MED ORDER — REGADENOSON 0.4 MG/5ML IV SOLN
0.4000 mg | Freq: Once | INTRAVENOUS | Status: AC
  Administered 2024-04-18: 10:00:00 0.4 mg via INTRAVENOUS

## 2024-04-19 ENCOUNTER — Encounter: Payer: Self-pay | Admitting: Cardiology

## 2024-04-19 DIAGNOSIS — I700 Atherosclerosis of aorta: Secondary | ICD-10-CM | POA: Insufficient documentation

## 2024-04-19 LAB — NM PET CT CARDIAC PERFUSION MULTI W/ABSOLUTE BLOODFLOW
MBFR: 2
Nuc Rest EF: 62 %
Nuc Stress EF: 72 %
Rest MBF: 0.71 ml/g/min
Rest Nuclear Isotope Dose: 24.5 mCi
ST Depression (mm): 0 mm
Stress MBF: 1.42 ml/g/min
Stress Nuclear Isotope Dose: 24.1 mCi

## 2024-04-23 NOTE — Telephone Encounter (Signed)
-----   Message from Wilbert Bihari, MD sent at 04/20/2024  4:25 PM EST ----- Please let patient know that stress test was fine with normal blood flow and coronary artery calcifications

## 2024-04-23 NOTE — Telephone Encounter (Signed)
 Call to patient to discuss stress test was fine with normal blood flow and coronary artery calcifications. Patient verbalizes understanding and states she already has follow up in [facility] with a specialist regarding lung findings on non-cardiac portion of images.

## 2024-05-04 ENCOUNTER — Ambulatory Visit (HOSPITAL_COMMUNITY)
Admission: RE | Admit: 2024-05-04 | Discharge: 2024-05-04 | Disposition: A | Discharge status: Home / Self Care | Source: Ambulatory Visit | Attending: Cardiology | Admitting: Cardiology

## 2024-05-04 DIAGNOSIS — R0600 Dyspnea, unspecified: Secondary | ICD-10-CM

## 2024-05-04 LAB — ECHOCARDIOGRAM COMPLETE
AR max vel: 2.09 cm2
AV Area VTI: 2.09 cm2
AV Area mean vel: 2.18 cm2
AV Mean grad: 5 mmHg
AV Peak grad: 10.9 mmHg
Ao pk vel: 1.65 m/s
Area-P 1/2: 2.61 cm2
S' Lateral: 2.9 cm

## 2024-06-12 ENCOUNTER — Ambulatory Visit: Admitting: Internal Medicine

## 2024-07-12 ENCOUNTER — Ambulatory Visit: Admitting: Internal Medicine
